# Patient Record
Sex: Female | Born: 1950 | Race: White | Hispanic: No | Marital: Married | Smoking: Never smoker
Health system: Southern US, Community
[De-identification: ages and names within clinical notes are randomized; demographics above are authoritative.]

## PROBLEM LIST (undated history)

## (undated) DIAGNOSIS — N179 Acute kidney failure, unspecified: Secondary | ICD-10-CM

## (undated) DIAGNOSIS — I509 Heart failure, unspecified: Secondary | ICD-10-CM

## (undated) DIAGNOSIS — I4891 Unspecified atrial fibrillation: Secondary | ICD-10-CM

## (undated) DIAGNOSIS — E119 Type 2 diabetes mellitus without complications: Secondary | ICD-10-CM

## (undated) DIAGNOSIS — N289 Disorder of kidney and ureter, unspecified: Secondary | ICD-10-CM

## (undated) DIAGNOSIS — N189 Chronic kidney disease, unspecified: Secondary | ICD-10-CM

## (undated) DIAGNOSIS — I10 Essential (primary) hypertension: Secondary | ICD-10-CM

## (undated) DIAGNOSIS — D649 Anemia, unspecified: Secondary | ICD-10-CM

## (undated) DIAGNOSIS — I5033 Acute on chronic diastolic (congestive) heart failure: Secondary | ICD-10-CM

## (undated) DIAGNOSIS — I272 Pulmonary hypertension, unspecified: Secondary | ICD-10-CM

---

## 2014-04-25 ENCOUNTER — Encounter (HOSPITAL_COMMUNITY): Payer: Self-pay | Admitting: *Deleted

## 2014-04-25 ENCOUNTER — Emergency Department (HOSPITAL_COMMUNITY): Payer: BC Managed Care – PPO

## 2014-04-25 ENCOUNTER — Emergency Department (HOSPITAL_COMMUNITY)
Admission: EM | Admit: 2014-04-25 | Discharge: 2014-04-25 | Disposition: A | Discharge status: Home / Self Care | Payer: BC Managed Care – PPO | Attending: Emergency Medicine | Admitting: Emergency Medicine

## 2014-04-25 DIAGNOSIS — H9209 Otalgia, unspecified ear: Secondary | ICD-10-CM | POA: Insufficient documentation

## 2014-04-25 DIAGNOSIS — J069 Acute upper respiratory infection, unspecified: Secondary | ICD-10-CM | POA: Diagnosis not present

## 2014-04-25 DIAGNOSIS — E119 Type 2 diabetes mellitus without complications: Secondary | ICD-10-CM | POA: Diagnosis not present

## 2014-04-25 DIAGNOSIS — R05 Cough: Secondary | ICD-10-CM

## 2014-04-25 DIAGNOSIS — I10 Essential (primary) hypertension: Secondary | ICD-10-CM | POA: Insufficient documentation

## 2014-04-25 DIAGNOSIS — R059 Cough, unspecified: Secondary | ICD-10-CM

## 2014-04-25 HISTORY — DX: Essential (primary) hypertension: I10

## 2014-04-25 HISTORY — DX: Type 2 diabetes mellitus without complications: E11.9

## 2014-04-25 LAB — CBG MONITORING, ED: Glucose-Capillary: 158 mg/dL — ABNORMAL HIGH (ref 70–99)

## 2014-04-25 MED ORDER — FLUTICASONE PROPIONATE 50 MCG/ACT NA SUSP
1.0000 | Freq: Every day | NASAL | Status: DC
  Administered 2014-04-25: 1 via NASAL
  Filled 2014-04-25: qty 16

## 2014-04-25 MED ORDER — FLUTICASONE PROPIONATE 50 MCG/ACT NA SUSP: 2.0000 | Freq: Every day | NASAL | Status: AC

## 2014-04-25 MED ORDER — SALINE SPRAY 0.65 % NA SOLN
1.0000 | NASAL | Status: DC | PRN
  Administered 2014-04-25: 1 via NASAL
  Filled 2014-04-25: qty 44

## 2014-04-25 MED ORDER — SALINE SPRAY 0.65 % NA SOLN: 1.0000 | NASAL | Status: AC | PRN

## 2014-04-25 MED ORDER — AZITHROMYCIN 250 MG PO TABS: 250.0000 mg | ORAL_TABLET | Freq: Every day | ORAL | Status: DC

## 2014-04-25 NOTE — ED Provider Notes (Signed)
CSN: [id number]     Arrival date & time 04/25/14  [id number] History   First MD Initiated Contact with Patient 04/25/14 [phone]     Chief Complaint  Patient presents with  . URI     (Consider location/radiation/quality/duration/timing/severity/associated sxs/prior Treatment) HPI  This is a 63 year old female with history of diabetes and hypertension who presents with congestion and cough. Patient reports onset of symptoms over the last 2-3 days. She states she is having difficulty breathing out of her nose. She reports a cough productive of "clear sputum." She denies any fevers. She has 3 grandchildren with similar symptoms. She reports this morning she had associated shortness of breath. Denies any chest pain. Also reports right otalgia.  Past Medical History  Diagnosis Date  . Diabetes mellitus without complication   . Hypertension    History reviewed. No pertinent past surgical history. No family history on file. History  Substance Use Topics  . Smoking status: Never Smoker   . Smokeless tobacco: Not on file  . Alcohol Use: No   OB History    No data available     Review of Systems  Constitutional: Negative for fever and chills.  HENT: Positive for congestion, ear pain and sinus pressure. Negative for facial swelling and rhinorrhea.   Respiratory: Positive for cough and shortness of breath. Negative for chest tightness.   Cardiovascular: Negative for chest pain and leg swelling.  Gastrointestinal: Negative for nausea, vomiting and abdominal pain.  Genitourinary: Negative for dysuria.  Musculoskeletal: Negative for back pain.  Neurological: Negative for headaches.  All other systems reviewed and are negative.     Allergies  Morphine and related  Home Medications   Prior to Admission medications   Medication Sig Start Date End Date Taking? Authorizing Provider  azithromycin (ZITHROMAX) 250 MG tablet Take 1 tablet (250 mg total) by mouth daily. Take first 2 tablets  together, then 1 every day until finished. 04/25/14   Merryl Hacker, MD  fluticasone (FLONASE) 50 MCG/ACT nasal spray Place 2 sprays into both nostrils daily. 04/25/14   Merryl Hacker, MD  sodium chloride (OCEAN) 0.65 % SOLN nasal spray Place 1 spray into both nostrils as needed for congestion. 04/25/14   Merryl Hacker, MD   BP 181/74 mmHg  Pulse 76  Temp(Src) 99.1 F (37.3 C) (Oral)  Resp 27  Ht 5\' 7"  (1.702 m)  Wt 186 lb (84.369 kg)  BMI 29.12 kg/m2  SpO2 92% Physical Exam  Constitutional: She is oriented to person, place, and time. No distress.  HENT:  Head: Normocephalic and atraumatic.  Right Ear: External ear normal.  Left Ear: External ear normal.  Mouth/Throat: Oropharynx is clear and moist. No oropharyngeal exudate.  No tenderness to palpation over the maxillary sinuses  Eyes: Pupils are equal, round, and reactive to light.  Neck: Neck supple.  Cardiovascular: Normal rate, regular rhythm and normal heart sounds.   Pulmonary/Chest: Effort normal and breath sounds normal. No respiratory distress. She has no wheezes.  Transmitted upper airway sounds  Abdominal: Soft. There is no tenderness.  Musculoskeletal: She exhibits no edema.  Lymphadenopathy:    She has no cervical adenopathy.  Neurological: She is alert and oriented to person, place, and time.  Skin: Skin is warm and dry.  Psychiatric: She has a normal mood and affect.  Nursing note and vitals reviewed.   ED Course  Procedures (including critical care time) Labs Review Labs Reviewed  CBG MONITORING, ED - Abnormal; Notable for the following:  Glucose-Capillary 158 (*)    All other components within normal limits    Imaging Review Dg Chest 2 View  04/25/2014   CLINICAL DATA:  Shortness of breath, cough and congestion with fever.  EXAM: CHEST  2 VIEW  COMPARISON:  None.  FINDINGS: Lungs are adequately inflated and demonstrate bilateral prominence of the perihilar markings right greater than  left. No evidence of effusion. Mild cardiomegaly is present. There is calcified plaque over the thoracic aorta. There are degenerative changes of the spine.  IMPRESSION: Mild prominence of the perihilar markings right greater than left which may be due to mild asymmetric facet congestion although cannot exclude interstitial pneumonia.   Electronically Signed   By: Marin Olp M.D.   On: 04/25/2014 08:15     EKG Interpretation None      MDM   Final diagnoses:  Cough  Upper respiratory infection    Patient presents with congestion and cough. Nontoxic on exam. Vital signs are reassuring. Satting 94-95% on my evaluation off oxygen. She does have transmitted upper airway sounds likely secondary to mouth breathing.  Patient given Flonase and nasal saline. Chest x-ray obtained and shows mild prominence of perihilar markings on the right which cannot exclude interstitial pneumonia. Patient has been afebrile.  However, due to symptoms, will place on Z-Pak. Difficulty picking up good pleth on the monitor. Pulse ox was switched and patient is satting 97% on room air. She was able to ambulate and maintain oxygen saturations.  Physical exam continues to be benign. Discuss with patient supportive care including Flonase and nasal saline as well as a Z-Pak for possible early pneumonia.  She was given strict return precautions.  After history, exam, and medical workup I feel the patient has been appropriately medically screened and is safe for discharge home. Pertinent diagnoses were discussed with the patient. Patient was given return precautions.     Merryl Hacker, MD 04/25/14 [phone]

## 2014-04-25 NOTE — ED Notes (Signed)
Pt oxygen saturation reading 97% on room air. Pt states she feels ok with no difficulty breathing.

## 2014-04-25 NOTE — Discharge Instructions (Signed)
You were seen today for congestion and cough.  Your chest x-ray may show an early pneumonia. He has not had any fevers. However, we will place you on a Z-Pak to cover for pneumonia. You can take Flonase and nasal saline for symptomatic congestion. If he had increasing shortness of breath, developed fevers, or any new or worsening symptoms, you should be reevaluated.  Upper Respiratory Infection, Adult An upper respiratory infection (URI) is also sometimes known as the common cold. The upper respiratory tract includes the nose, sinuses, throat, trachea, and bronchi. Bronchi are the airways leading to the lungs. Most people improve within 1 week, but symptoms can last up to 2 weeks. A residual cough may last even longer.  CAUSES Many different viruses can infect the tissues lining the upper respiratory tract. The tissues become irritated and inflamed and often become very moist. Mucus production is also common. A cold is contagious. You can easily spread the virus to others by oral contact. This includes kissing, sharing a glass, coughing, or sneezing. Touching your mouth or nose and then touching a surface, which is then touched by another person, can also spread the virus. SYMPTOMS  Symptoms typically develop 1 to 3 days after you come in contact with a cold virus. Symptoms vary from person to person. They may include:  Runny nose.  Sneezing.  Nasal congestion.  Sinus irritation.  Sore throat.  Loss of voice (laryngitis).  Cough.  Fatigue.  Muscle aches.  Loss of appetite.  Headache.  Low-grade fever. DIAGNOSIS  You might diagnose your own cold based on familiar symptoms, since most people get a cold 2 to 3 times a year. Your caregiver can confirm this based on your exam. Most importantly, your caregiver can check that your symptoms are not due to another disease such as strep throat, sinusitis, pneumonia, asthma, or epiglottitis. Blood tests, throat tests, and X-rays are not  necessary to diagnose a common cold, but they may sometimes be helpful in excluding other more serious diseases. Your caregiver will decide if any further tests are required. RISKS AND COMPLICATIONS  You may be at risk for a more severe case of the common cold if you smoke cigarettes, have chronic heart disease (such as heart failure) or lung disease (such as asthma), or if you have a weakened immune system. The very young and very old are also at risk for more serious infections. Bacterial sinusitis, middle ear infections, and bacterial pneumonia can complicate the common cold. The common cold can worsen asthma and chronic obstructive pulmonary disease (COPD). Sometimes, these complications can require emergency medical care and may be life-threatening. PREVENTION  The best way to protect against getting a cold is to practice good hygiene. Avoid oral or hand contact with people with cold symptoms. Wash your hands often if contact occurs. There is no clear evidence that vitamin C, vitamin E, echinacea, or exercise reduces the chance of developing a cold. However, it is always recommended to get plenty of rest and practice good nutrition. TREATMENT  Treatment is directed at relieving symptoms. There is no cure. Antibiotics are not effective, because the infection is caused by a virus, not by bacteria. Treatment may include:  Increased fluid intake. Sports drinks offer valuable electrolytes, sugars, and fluids.  Breathing heated mist or steam (vaporizer or shower).  Eating chicken soup or other clear broths, and maintaining good nutrition.  Getting plenty of rest.  Using gargles or lozenges for comfort.  Controlling fevers with ibuprofen or acetaminophen as  directed by your caregiver.  Increasing usage of your inhaler if you have asthma. Zinc gel and zinc lozenges, taken in the first 24 hours of the common cold, can shorten the duration and lessen the severity of symptoms. Pain medicines may help  with fever, muscle aches, and throat pain. A variety of non-prescription medicines are available to treat congestion and runny nose. Your caregiver can make recommendations and may suggest nasal or lung inhalers for other symptoms.  HOME CARE INSTRUCTIONS   Only take over-the-counter or prescription medicines for pain, discomfort, or fever as directed by your caregiver.  Use a warm mist humidifier or inhale steam from a shower to increase air moisture. This may keep secretions moist and make it easier to breathe.  Drink enough water and fluids to keep your urine clear or pale yellow.  Rest as needed.  Return to work when your temperature has returned to normal or as your caregiver advises. You may need to stay home longer to avoid infecting others. You can also use a face mask and careful hand washing to prevent spread of the virus. SEEK MEDICAL CARE IF:   After the first few days, you feel you are getting worse rather than better.  You need your caregiver's advice about medicines to control symptoms.  You develop chills, worsening shortness of breath, or brown or red sputum. These may be signs of pneumonia.  You develop yellow or brown nasal discharge or pain in the face, especially when you bend forward. These may be signs of sinusitis.  You develop a fever, swollen neck glands, pain with swallowing, or white areas in the back of your throat. These may be signs of strep throat. SEEK IMMEDIATE MEDICAL CARE IF:   You have a fever.  You develop severe or persistent headache, ear pain, sinus pain, or chest pain.  You develop wheezing, a prolonged cough, cough up blood, or have a change in your usual mucus (if you have chronic lung disease).  You develop sore muscles or a stiff neck. Document Released: 11/15/2000 Document Revised: 08/14/2011 Document Reviewed: 08/27/2013 [facility] Patient Information 2015 [address], [place]. This information is not intended to replace advice given to you by  your health care provider. Make sure you discuss any questions you have with your health care provider.

## 2014-04-25 NOTE — ED Notes (Signed)
Pulse ox did not pick up a good pleth in room stat read 88% room air changed to sticker pulse ox with good pleth patient stat is at 96% room air. When ambulating patients O2 stat 97% room air. Patient states she feels fine and breathing at her normal. RN and MD notified and aware.

## 2014-04-25 NOTE — ED Notes (Signed)
MD at bedside. 

## 2014-04-25 NOTE — ED Notes (Addendum)
Pt states congestion and cough, productive and clear in color x 2 days. States 3 of her grandchildren have URI's. Pt states nasal congestion and "head feels like it is stopped up real bad" 89-90% on RA. Pt placed on O2 at 2L via [place] and O2 went up to 99%

## 2014-04-25 NOTE — ED Notes (Signed)
Request for meds sent to the pharmacy.

## 2014-06-07 ENCOUNTER — Inpatient Hospital Stay (HOSPITAL_COMMUNITY)
Admission: EM | Admit: 2014-06-07 | Discharge: 2014-06-14 | DRG: 682 | Disposition: A | Discharge status: Home Health | Payer: BLUE CROSS/BLUE SHIELD | Attending: Internal Medicine | Admitting: Internal Medicine

## 2014-06-07 ENCOUNTER — Emergency Department (HOSPITAL_COMMUNITY): Payer: BLUE CROSS/BLUE SHIELD

## 2014-06-07 ENCOUNTER — Encounter (HOSPITAL_COMMUNITY): Payer: Self-pay | Admitting: Emergency Medicine

## 2014-06-07 DIAGNOSIS — E785 Hyperlipidemia, unspecified: Secondary | ICD-10-CM | POA: Diagnosis present

## 2014-06-07 DIAGNOSIS — Z6829 Body mass index (BMI) 29.0-29.9, adult: Secondary | ICD-10-CM

## 2014-06-07 DIAGNOSIS — E669 Obesity, unspecified: Secondary | ICD-10-CM | POA: Diagnosis present

## 2014-06-07 DIAGNOSIS — S0083XA Contusion of other part of head, initial encounter: Secondary | ICD-10-CM

## 2014-06-07 DIAGNOSIS — E876 Hypokalemia: Secondary | ICD-10-CM | POA: Diagnosis present

## 2014-06-07 DIAGNOSIS — R55 Syncope and collapse: Secondary | ICD-10-CM

## 2014-06-07 DIAGNOSIS — E860 Dehydration: Secondary | ICD-10-CM | POA: Diagnosis present

## 2014-06-07 DIAGNOSIS — N179 Acute kidney failure, unspecified: Secondary | ICD-10-CM

## 2014-06-07 DIAGNOSIS — J9600 Acute respiratory failure, unspecified whether with hypoxia or hypercapnia: Secondary | ICD-10-CM | POA: Diagnosis not present

## 2014-06-07 DIAGNOSIS — N390 Urinary tract infection, site not specified: Secondary | ICD-10-CM | POA: Diagnosis present

## 2014-06-07 DIAGNOSIS — E872 Acidosis, unspecified: Secondary | ICD-10-CM | POA: Diagnosis present

## 2014-06-07 DIAGNOSIS — Y92010 Kitchen of single-family (private) house as the place of occurrence of the external cause: Secondary | ICD-10-CM | POA: Diagnosis not present

## 2014-06-07 DIAGNOSIS — R600 Localized edema: Secondary | ICD-10-CM | POA: Diagnosis present

## 2014-06-07 DIAGNOSIS — I129 Hypertensive chronic kidney disease with stage 1 through stage 4 chronic kidney disease, or unspecified chronic kidney disease: Secondary | ICD-10-CM | POA: Diagnosis present

## 2014-06-07 DIAGNOSIS — R0600 Dyspnea, unspecified: Secondary | ICD-10-CM | POA: Diagnosis present

## 2014-06-07 DIAGNOSIS — E119 Type 2 diabetes mellitus without complications: Secondary | ICD-10-CM | POA: Diagnosis present

## 2014-06-07 DIAGNOSIS — E1122 Type 2 diabetes mellitus with diabetic chronic kidney disease: Secondary | ICD-10-CM | POA: Diagnosis present

## 2014-06-07 DIAGNOSIS — I5033 Acute on chronic diastolic (congestive) heart failure: Secondary | ICD-10-CM | POA: Diagnosis present

## 2014-06-07 DIAGNOSIS — I272 Other secondary pulmonary hypertension: Secondary | ICD-10-CM | POA: Diagnosis present

## 2014-06-07 DIAGNOSIS — T502X5A Adverse effect of carbonic-anhydrase inhibitors, benzothiadiazides and other diuretics, initial encounter: Secondary | ICD-10-CM | POA: Diagnosis present

## 2014-06-07 DIAGNOSIS — E559 Vitamin D deficiency, unspecified: Secondary | ICD-10-CM | POA: Diagnosis present

## 2014-06-07 DIAGNOSIS — R0602 Shortness of breath: Secondary | ICD-10-CM

## 2014-06-07 DIAGNOSIS — T501X5A Adverse effect of loop [high-ceiling] diuretics, initial encounter: Secondary | ICD-10-CM | POA: Diagnosis present

## 2014-06-07 DIAGNOSIS — B961 Klebsiella pneumoniae [K. pneumoniae] as the cause of diseases classified elsewhere: Secondary | ICD-10-CM | POA: Diagnosis present

## 2014-06-07 DIAGNOSIS — T447X5A Adverse effect of beta-adrenoreceptor antagonists, initial encounter: Secondary | ICD-10-CM | POA: Diagnosis present

## 2014-06-07 DIAGNOSIS — S40812A Abrasion of left upper arm, initial encounter: Secondary | ICD-10-CM | POA: Diagnosis present

## 2014-06-07 DIAGNOSIS — N170 Acute kidney failure with tubular necrosis: Secondary | ICD-10-CM | POA: Diagnosis present

## 2014-06-07 DIAGNOSIS — D649 Anemia, unspecified: Secondary | ICD-10-CM | POA: Diagnosis present

## 2014-06-07 DIAGNOSIS — N183 Chronic kidney disease, stage 3 (moderate): Secondary | ICD-10-CM | POA: Diagnosis present

## 2014-06-07 DIAGNOSIS — W19XXXA Unspecified fall, initial encounter: Secondary | ICD-10-CM | POA: Diagnosis present

## 2014-06-07 DIAGNOSIS — I5032 Chronic diastolic (congestive) heart failure: Secondary | ICD-10-CM | POA: Diagnosis present

## 2014-06-07 DIAGNOSIS — I2699 Other pulmonary embolism without acute cor pulmonale: Secondary | ICD-10-CM

## 2014-06-07 DIAGNOSIS — I951 Orthostatic hypotension: Secondary | ICD-10-CM | POA: Diagnosis present

## 2014-06-07 DIAGNOSIS — N189 Chronic kidney disease, unspecified: Secondary | ICD-10-CM

## 2014-06-07 DIAGNOSIS — I480 Paroxysmal atrial fibrillation: Secondary | ICD-10-CM | POA: Diagnosis present

## 2014-06-07 DIAGNOSIS — D509 Iron deficiency anemia, unspecified: Secondary | ICD-10-CM | POA: Diagnosis present

## 2014-06-07 DIAGNOSIS — J9601 Acute respiratory failure with hypoxia: Secondary | ICD-10-CM | POA: Diagnosis present

## 2014-06-07 DIAGNOSIS — I10 Essential (primary) hypertension: Secondary | ICD-10-CM | POA: Diagnosis present

## 2014-06-07 DIAGNOSIS — I4891 Unspecified atrial fibrillation: Secondary | ICD-10-CM | POA: Diagnosis not present

## 2014-06-07 DIAGNOSIS — I5031 Acute diastolic (congestive) heart failure: Secondary | ICD-10-CM | POA: Diagnosis present

## 2014-06-07 HISTORY — DX: Acute kidney failure, unspecified: N17.9

## 2014-06-07 HISTORY — DX: Anemia, unspecified: D64.9

## 2014-06-07 HISTORY — DX: Chronic kidney disease, unspecified: N18.9

## 2014-06-07 HISTORY — DX: Unspecified atrial fibrillation: I48.91

## 2014-06-07 HISTORY — DX: Disorder of kidney and ureter, unspecified: N28.9

## 2014-06-07 HISTORY — DX: Pulmonary hypertension, unspecified: I27.20

## 2014-06-07 HISTORY — DX: Heart failure, unspecified: I50.9

## 2014-06-07 HISTORY — DX: Acute on chronic diastolic (congestive) heart failure: I50.33

## 2014-06-07 LAB — BASIC METABOLIC PANEL
ANION GAP: 9 (ref 5–15)
BUN: 64 mg/dL — AB (ref 6–23)
CALCIUM: 7.9 mg/dL — AB (ref 8.4–10.5)
CHLORIDE: 110 meq/L (ref 96–112)
CO2: 18 mmol/L — ABNORMAL LOW (ref 19–32)
Creatinine, Ser: 6.55 mg/dL — ABNORMAL HIGH (ref 0.50–1.10)
GFR calc Af Amer: 7 mL/min — ABNORMAL LOW (ref >90)
GFR calc non Af Amer: 6 mL/min — ABNORMAL LOW (ref >90)
GLUCOSE: 91 mg/dL (ref 70–99)
Potassium: 4.1 mmol/L (ref 3.5–5.1)
Sodium: 137 mmol/L (ref 135–145)

## 2014-06-07 LAB — CBC WITH DIFFERENTIAL/PLATELET
Basophils Absolute: 0.1 10*3/uL (ref 0.0–0.1)
Basophils Relative: 1 % (ref 0–1)
Eosinophils Absolute: 0.3 10*3/uL (ref 0.0–0.7)
Eosinophils Relative: 6 % — ABNORMAL HIGH (ref 0–5)
HEMATOCRIT: 23.6 % — AB (ref 36.0–46.0)
Hemoglobin: 7.5 g/dL — ABNORMAL LOW (ref 12.0–15.0)
LYMPHS ABS: 1 10*3/uL (ref 0.7–4.0)
LYMPHS PCT: 19 % (ref 12–46)
MCH: 27.8 pg (ref 26.0–34.0)
MCHC: 31.8 g/dL (ref 30.0–36.0)
MCV: 87.4 fL (ref 78.0–100.0)
MONO ABS: 0.5 10*3/uL (ref 0.1–1.0)
MONOS PCT: 8 % (ref 3–12)
Neutro Abs: 3.7 10*3/uL (ref 1.7–7.7)
Neutrophils Relative %: 66 % (ref 43–77)
PLATELETS: 292 10*3/uL (ref 150–400)
RBC: 2.7 MIL/uL — ABNORMAL LOW (ref 3.87–5.11)
RDW: 14.4 % (ref 11.5–15.5)
WBC: 5.6 10*3/uL (ref 4.0–10.5)

## 2014-06-07 LAB — URINALYSIS, ROUTINE W REFLEX MICROSCOPIC
BILIRUBIN URINE: NEGATIVE
GLUCOSE, UA: NEGATIVE mg/dL
KETONES UR: NEGATIVE mg/dL
Leukocytes, UA: NEGATIVE
NITRITE: NEGATIVE
Protein, ur: 100 mg/dL — AB
Specific Gravity, Urine: 1.025 (ref 1.005–1.030)
Urobilinogen, UA: 0.2 mg/dL (ref 0.0–1.0)
pH: 6 (ref 5.0–8.0)

## 2014-06-07 LAB — URINE MICROSCOPIC-ADD ON

## 2014-06-07 LAB — TROPONIN I

## 2014-06-07 NOTE — ED Provider Notes (Signed)
CSN: [id number]     Arrival date & time 06/07/14  2113 History   This chart was scribed for Brenda Blade, MD by Brenda Lee, ED Scribe. This patient was seen in room APA04/APA04 and the patient's care was started at 9:17 PM.      Chief Complaint  Patient presents with  . Near Syncope  . Cough   Patient is a 64 y.o. female presenting with near-syncope and cough. The history is provided by the patient. No language interpreter was used.  Near Syncope Pertinent negatives include no shortness of breath.  Cough Associated symptoms: no fever and no shortness of breath    HPI Comments: Brenda Lee is a 64 y.o. female who presents to the Emergency Department complaining of near syncope onset tonight. Pt states her symptoms are exasperated when bending over forwards. Pt states that today she fell after bending over to pick up a glass of soda. Pt states that a few days prior she experienced similar symptoms. Pt states that a few days ago she fell but doesn't remember. Pt presents with some facial bruising and an abrasion to her left arm. Pt states that she has had a productive cough of clear sputum for the past 3 weeks as well. Pt denies fever, sob, nausea, vomiting, trouble urinating or gait problem.     Past Medical History  Diagnosis Date  . Diabetes mellitus without complication   . Hypertension    History reviewed. No pertinent past surgical history. History reviewed. No pertinent family history. History  Substance Use Topics  . Smoking status: Never Smoker   . Smokeless tobacco: Not on file  . Alcohol Use: No   OB History    No data available      Review of Systems  Constitutional: Negative for fever.  Respiratory: Positive for cough. Negative for shortness of breath.   Cardiovascular: Positive for near-syncope.  Gastrointestinal: Negative for nausea and vomiting.  Genitourinary: Negative for difficulty urinating.  Musculoskeletal: Negative for gait problem.  All  other systems reviewed and are negative.    Allergies  Morphine and related  Home Medications   Prior to Admission medications   Medication Sig Start Date End Date Taking? Authorizing Provider  amLODipine-valsartan (EXFORGE) 10-320 MG per tablet Take 1 tablet by mouth daily. 04/21/14   Historical Provider, MD  atenolol (TENORMIN) 50 MG tablet Take 75 mg by mouth 2 (two) times daily. 04/21/14   Historical Provider, MD  atorvastatin (LIPITOR) 10 MG tablet Take 10 mg by mouth at bedtime. 04/21/14   Historical Provider, MD  azithromycin (ZITHROMAX) 250 MG tablet Take 1 tablet (250 mg total) by mouth daily. Take first 2 tablets together, then 1 every day until finished. Patient not taking: Reported on 06/07/2014 04/25/14   Merryl Hacker, MD  clonazePAM (KLONOPIN) 0.5 MG tablet Take 0.5 mg by mouth 2 (two) times daily as needed. 04/21/14   Historical Provider, MD  fexofenadine (ALLEGRA) 180 MG tablet Take 180 mg by mouth daily. 05/01/14   Historical Provider, MD  fluticasone (FLONASE) 50 MCG/ACT nasal spray Place 2 sprays into both nostrils daily. 04/25/14   Merryl Hacker, MD  glimepiride (AMARYL) 2 MG tablet Take 2 mg by mouth daily. 04/21/14   Historical Provider, MD  lisinopril (PRINIVIL,ZESTRIL) 10 MG tablet Take 10 mg by mouth daily. 04/21/14   Historical Provider, MD  metFORMIN (GLUCOPHAGE) 500 MG tablet Take 500 mg by mouth daily. 04/21/14   Historical Provider, MD  sodium chloride (OCEAN)  0.65 % SOLN nasal spray Place 1 spray into both nostrils as needed for congestion. 04/25/14   Merryl Hacker, MD  triamterene-hydrochlorothiazide (MAXZIDE-25) 37.5-25 MG per tablet Take 1 tablet by mouth daily. 04/21/14   Historical Provider, MD   Triage Vitals: BP 148/59 mmHg  Pulse 74  Temp(Src) 97.7 F (36.5 C)  Resp 22  Ht 5\' 7"  (1.702 m)  Wt 186 lb (84.369 kg)  BMI 29.12 kg/m2  SpO2 96%    Physical Exam  Constitutional: She is oriented to person, place, and time. She appears  well-developed and well-nourished.  HENT:  Head: Normocephalic and atraumatic.  Bruising to left upper cheek and left lateral periorbital, no crepitation  Eyes: Conjunctivae and EOM are normal. Pupils are equal, round, and reactive to light.  Neck: Normal range of motion and phonation normal. Neck supple.  Cardiovascular: Normal rate and regular rhythm.   Pulmonary/Chest: Effort normal and breath sounds normal. She exhibits no tenderness.  Abdominal: Soft. She exhibits no distension and no mass. There is no tenderness. There is no guarding.  No palpable urinary bladder enlargement  Musculoskeletal: Normal range of motion. She exhibits edema.  Left elbow superficial abrasion laterally appear to healing with out bleeding. normal ROM elbow, wrist and hand. 2+ edema of bilateral legs   Neurological: She is alert and oriented to person, place, and time. She exhibits normal muscle tone.  Skin: Skin is warm and dry.  Psychiatric: She has a normal mood and affect. Her behavior is normal. Judgment and thought content normal.  Nursing note and vitals reviewed.   ED Course  Procedures (including critical care time) DIAGNOSTIC STUDIES: Oxygen Saturation is 96% on RA, adequate by my interpretation.    COORDINATION OF CARE: 9:39 PM-Discussed treatment plan with pt at bedside and pt agreed to plan.   Medications - No data to display  Patient Vitals for the past 24 hrs:  BP Temp Pulse Resp SpO2 Height Weight  06/07/14 2130 138/63 mmHg - 72 25 94 % - -  06/07/14 2119 148/59 mmHg 97.7 F (36.5 C) 74 22 96 % 5\' 7"  (1.702 m) 186 lb (84.369 kg)    11:14 PM Reevaluation with update and discussion. After initial assessment and treatment, an updated evaluation reveals no further complaints.  She cannot recall ever having problems with her kidney.  Family members are here and are updated on the findings. Brenda Lee   11:30 p.m.- bladder scan done by nursing shows greater than 150 cc of urine in the  urinary bladder.  Foley catheterization order to improve urinary output.   11:41 PM-Consult complete with Dr. Arnoldo Morale. Patient case explained and discussed. She agrees to admit patient for further evaluation and treatment. Call ended at 23:47  CRITICAL CARE Performed by: Brenda Lee Total critical care time: 35 minutes Critical care time was exclusive of separately billable procedures and treating other patients. Critical care was necessary to treat or prevent imminent or life-threatening deterioration. Critical care was time spent personally by me on the following activities: development of treatment plan with patient and/or surrogate as well as nursing, discussions with consultants, evaluation of patient's response to treatment, examination of patient, obtaining history from patient or surrogate, ordering and performing treatments and interventions, ordering and review of laboratory studies, ordering and review of radiographic studies, pulse oximetry and re-evaluation of patient's condition.  Labs Review Labs Reviewed  CBC WITH DIFFERENTIAL - Abnormal; Notable for the following:    RBC 2.70 (*)    Hemoglobin 7.5 (*)  HCT 23.6 (*)    Eosinophils Relative 6 (*)    All other components within normal limits  BASIC METABOLIC PANEL - Abnormal; Notable for the following:    CO2 18 (*)    BUN 64 (*)    Creatinine, Ser 6.55 (*)    Calcium 7.9 (*)    GFR calc non Af Amer 6 (*)    GFR calc Af Amer 7 (*)    All other components within normal limits  URINALYSIS, ROUTINE W REFLEX MICROSCOPIC - Abnormal; Notable for the following:    APPearance HAZY (*)    Hgb urine dipstick TRACE (*)    Protein, ur 100 (*)    All other components within normal limits  URINE MICROSCOPIC-ADD ON - Abnormal; Notable for the following:    Squamous Epithelial / LPF MANY (*)    Bacteria, UA MANY (*)    All other components within normal limits  URINE CULTURE  URINE CULTURE  TROPONIN I    Imaging  Review Dg Chest 2 View  06/07/2014   CLINICAL DATA:  Productive cough for 3 weeks. Vomiting. Near syncope tonight. Multiple falls over the last few days.  EXAM: CHEST  2 VIEW  COMPARISON:  04/25/2014  FINDINGS: Shallow inspiration with elevation of the right hemidiaphragm. Normal heart size and pulmonary vascularity. No focal airspace disease or consolidation in the lungs. No blunting of costophrenic angles. No pneumothorax. Mediastinal contours appear intact. Degenerative changes in the spine. Calcified and tortuous aorta.  IMPRESSION: No active cardiopulmonary disease.   Electronically Signed   By: Lucienne Capers M.D.   On: 06/07/2014 22:11   Ct Head Wo Contrast  06/07/2014   CLINICAL DATA:  Near syncope tonight. Symptoms are exacerbated when bending forward to. Multiple falls over the past few days. Facial bruising.  EXAM: CT HEAD WITHOUT CONTRAST  TECHNIQUE: Contiguous axial images were obtained from the base of the skull through the vertex without intravenous contrast.  COMPARISON:  None.  FINDINGS: Diffuse cerebral atrophy. Mild ventricular dilatation consistent with central atrophy. Patchy periventricular changes in the deep white matter consistent with small vessel ischemia. No mass effect or midline shift. No abnormal extra-axial fluid collections. Gray-white matter junctions are distinct. Basal cisterns are not effaced. No evidence of acute intracranial hemorrhage. No depressed skull fractures. Visualized paranasal sinuses and mastoid air cells are not opacified. Vascular calcifications.  IMPRESSION: No acute intracranial abnormalities. Chronic atrophy and small vessel ischemic changes.   Electronically Signed   By: Lucienne Capers M.D.   On: 06/07/2014 22:13     EKG Interpretation   Date/Time:  Sunday June 07 2014 21:17:52 EST Ventricular Rate:  74 PR Interval:  150 QRS Duration: 82 QT Interval:  439 QTC Calculation: 487 R Axis:   27 Text Interpretation:  Sinus rhythm Low voltage,  precordial leads  Borderline prolonged QT interval No previous ECGs available Confirmed by  [facility]  MD, Vira Agar [id number]) on 06/07/2014 9:51:44 PM      MDM   Final diagnoses:  Acute renal failure, unspecified acute renal failure type  Syncope, unspecified syncope type  Contusion of face, initial encounter    Acute renal failure.  Multifactorial source is suspected.  She is on ACE and ARB and has urinary retention.  She will need to be admitted for observation, treatment, and renal consultation.  Patient is essentially nontoxic, but has had at least one episode of syncope and another episode of fall related to near syncope.  EKG does not show conduction  abnormalities.  Potassium is normal.  No traumatic injuries.  Doubt serious bacterial infection.   Nursing Notes Reviewed/ Care Coordinated Applicable Imaging Reviewed Interpretation of Laboratory Data incorporated into ED treatment  I personally performed the services described in this documentation, which was scribed in my presence. The recorded information has been reviewed and is accurate.       Brenda Blade, MD 06/07/14 [phone]

## 2014-06-07 NOTE — ED Notes (Signed)
States she has had a cold since Thanksgiving with URI, has been treated. BP elevated and patient has syncopal episodes when she bends over.

## 2014-06-08 ENCOUNTER — Inpatient Hospital Stay (HOSPITAL_COMMUNITY): Payer: BLUE CROSS/BLUE SHIELD

## 2014-06-08 ENCOUNTER — Encounter (HOSPITAL_COMMUNITY): Payer: Self-pay | Admitting: *Deleted

## 2014-06-08 DIAGNOSIS — R0600 Dyspnea, unspecified: Secondary | ICD-10-CM | POA: Diagnosis present

## 2014-06-08 DIAGNOSIS — R600 Localized edema: Secondary | ICD-10-CM | POA: Diagnosis present

## 2014-06-08 DIAGNOSIS — D649 Anemia, unspecified: Secondary | ICD-10-CM

## 2014-06-08 DIAGNOSIS — R55 Syncope and collapse: Secondary | ICD-10-CM

## 2014-06-08 DIAGNOSIS — E119 Type 2 diabetes mellitus without complications: Secondary | ICD-10-CM

## 2014-06-08 DIAGNOSIS — I059 Rheumatic mitral valve disease, unspecified: Secondary | ICD-10-CM

## 2014-06-08 DIAGNOSIS — N179 Acute kidney failure, unspecified: Secondary | ICD-10-CM

## 2014-06-08 DIAGNOSIS — E785 Hyperlipidemia, unspecified: Secondary | ICD-10-CM

## 2014-06-08 DIAGNOSIS — R609 Edema, unspecified: Secondary | ICD-10-CM

## 2014-06-08 DIAGNOSIS — I5033 Acute on chronic diastolic (congestive) heart failure: Secondary | ICD-10-CM

## 2014-06-08 DIAGNOSIS — I10 Essential (primary) hypertension: Secondary | ICD-10-CM

## 2014-06-08 DIAGNOSIS — S0083XA Contusion of other part of head, initial encounter: Secondary | ICD-10-CM

## 2014-06-08 DIAGNOSIS — I5032 Chronic diastolic (congestive) heart failure: Secondary | ICD-10-CM

## 2014-06-08 HISTORY — DX: Acute on chronic diastolic (congestive) heart failure: I50.33

## 2014-06-08 HISTORY — DX: Acute kidney failure, unspecified: N17.9

## 2014-06-08 LAB — CBC
HCT: 21.8 % — ABNORMAL LOW (ref 36.0–46.0)
HEMOGLOBIN: 7 g/dL — AB (ref 12.0–15.0)
MCH: 28.1 pg (ref 26.0–34.0)
MCHC: 32.1 g/dL (ref 30.0–36.0)
MCV: 87.6 fL (ref 78.0–100.0)
PLATELETS: 255 10*3/uL (ref 150–400)
RBC: 2.49 MIL/uL — ABNORMAL LOW (ref 3.87–5.11)
RDW: 14.3 % (ref 11.5–15.5)
WBC: 4.9 10*3/uL (ref 4.0–10.5)

## 2014-06-08 LAB — LIPID PANEL
Cholesterol: 169 mg/dL (ref 0–200)
HDL: 29 mg/dL — ABNORMAL LOW (ref >39)
LDL Cholesterol: 118 mg/dL — ABNORMAL HIGH (ref 0–99)
Total CHOL/HDL Ratio: 5.8 RATIO
Triglycerides: 112 mg/dL (ref <150)
VLDL: 22 mg/dL (ref 0–40)

## 2014-06-08 LAB — BASIC METABOLIC PANEL
Anion gap: 8 (ref 5–15)
BUN: 66 mg/dL — ABNORMAL HIGH (ref 6–23)
CO2: 18 mmol/L — ABNORMAL LOW (ref 19–32)
Calcium: 7.8 mg/dL — ABNORMAL LOW (ref 8.4–10.5)
Chloride: 110 mEq/L (ref 96–112)
Creatinine, Ser: 6.55 mg/dL — ABNORMAL HIGH (ref 0.50–1.10)
GFR, EST AFRICAN AMERICAN: 7 mL/min — AB (ref >90)
GFR, EST NON AFRICAN AMERICAN: 6 mL/min — AB (ref >90)
Glucose, Bld: 197 mg/dL — ABNORMAL HIGH (ref 70–99)
POTASSIUM: 4.7 mmol/L (ref 3.5–5.1)
SODIUM: 136 mmol/L (ref 135–145)

## 2014-06-08 LAB — GLUCOSE, CAPILLARY
GLUCOSE-CAPILLARY: 191 mg/dL — AB (ref 70–99)
GLUCOSE-CAPILLARY: 164 mg/dL — AB (ref 70–99)
GLUCOSE-CAPILLARY: 209 mg/dL — AB (ref 70–99)
GLUCOSE-CAPILLARY: 307 mg/dL — AB (ref 70–99)
Glucose-Capillary: 197 mg/dL — ABNORMAL HIGH (ref 70–99)
Glucose-Capillary: 305 mg/dL — ABNORMAL HIGH (ref 70–99)

## 2014-06-08 LAB — IRON AND TIBC
Iron: 24 ug/dL — ABNORMAL LOW (ref 42–145)
Saturation Ratios: 11 % — ABNORMAL LOW (ref 20–55)
TIBC: 221 ug/dL — ABNORMAL LOW (ref 250–470)
UIBC: 197 ug/dL (ref 125–400)

## 2014-06-08 LAB — FERRITIN: FERRITIN: 121 ng/mL (ref 10–291)

## 2014-06-08 LAB — RETICULOCYTES
RBC.: 2.49 MIL/uL — ABNORMAL LOW (ref 3.87–5.11)
RETIC CT PCT: 2.1 % (ref 0.4–3.1)
Retic Count, Absolute: 52.3 10*3/uL (ref 19.0–186.0)

## 2014-06-08 LAB — TSH: TSH: 1.646 u[IU]/mL (ref 0.350–4.500)

## 2014-06-08 LAB — VITAMIN B12: Vitamin B-12: 433 pg/mL (ref 211–911)

## 2014-06-08 LAB — FOLATE: FOLATE: 5.5 ng/mL

## 2014-06-08 MED ORDER — HEPARIN SODIUM (PORCINE) 5000 UNIT/ML IJ SOLN
5000.0000 [IU] | Freq: Three times a day (TID) | INTRAMUSCULAR | Status: DC
  Administered 2014-06-08 – 2014-06-14 (×18): 5000 [IU] via SUBCUTANEOUS
  Filled 2014-06-08 (×18): qty 1

## 2014-06-08 MED ORDER — OXYCODONE HCL 5 MG PO TABS: 5.0000 mg | ORAL_TABLET | ORAL | Status: DC | PRN

## 2014-06-08 MED ORDER — PANTOPRAZOLE SODIUM 40 MG PO TBEC
40.0000 mg | DELAYED_RELEASE_TABLET | Freq: Every day | ORAL | Status: DC
  Administered 2014-06-08 – 2014-06-14 (×7): 40 mg via ORAL
  Filled 2014-06-08 (×7): qty 1

## 2014-06-08 MED ORDER — CLONAZEPAM 0.5 MG PO TABS
0.5000 mg | ORAL_TABLET | Freq: Two times a day (BID) | ORAL | Status: DC | PRN
Start: 2014-06-08 — End: 2014-06-14
  Administered 2014-06-09 – 2014-06-11 (×2): 0.5 mg via ORAL
  Filled 2014-06-08 (×3): qty 1

## 2014-06-08 MED ORDER — SODIUM CHLORIDE 0.9 % IJ SOLN
3.0000 mL | Freq: Two times a day (BID) | INTRAMUSCULAR | Status: DC
  Administered 2014-06-08 – 2014-06-11 (×6): 3 mL via INTRAVENOUS

## 2014-06-08 MED ORDER — INSULIN ASPART 100 UNIT/ML ~~LOC~~ SOLN
0.0000 [IU] | SUBCUTANEOUS | Status: DC
  Administered 2014-06-08 (×3): 2 [IU] via SUBCUTANEOUS
  Administered 2014-06-08 (×2): 7 [IU] via SUBCUTANEOUS
  Administered 2014-06-08: 3 [IU] via SUBCUTANEOUS
  Administered 2014-06-09: 5 [IU] via SUBCUTANEOUS
  Administered 2014-06-09: 2 [IU] via SUBCUTANEOUS

## 2014-06-08 MED ORDER — ACETAMINOPHEN 650 MG RE SUPP: 650.0000 mg | Freq: Four times a day (QID) | RECTAL | Status: DC | PRN

## 2014-06-08 MED ORDER — ATORVASTATIN CALCIUM 10 MG PO TABS
10.0000 mg | ORAL_TABLET | Freq: Every day | ORAL | Status: DC
  Administered 2014-06-08 – 2014-06-13 (×6): 10 mg via ORAL
  Filled 2014-06-08 (×6): qty 1

## 2014-06-08 MED ORDER — SODIUM CHLORIDE 0.9 % IV SOLN
INTRAVENOUS | Status: DC
  Administered 2014-06-08 (×3): via INTRAVENOUS

## 2014-06-08 MED ORDER — ATENOLOL 25 MG PO TABS
75.0000 mg | ORAL_TABLET | Freq: Two times a day (BID) | ORAL | Status: DC
  Administered 2014-06-08 – 2014-06-11 (×7): 75 mg via ORAL
  Filled 2014-06-08 (×7): qty 3

## 2014-06-08 MED ORDER — HYDRALAZINE HCL 20 MG/ML IJ SOLN: 5.0000 mg | Freq: Four times a day (QID) | INTRAMUSCULAR | Status: DC | PRN

## 2014-06-08 MED ORDER — FUROSEMIDE 10 MG/ML IJ SOLN
80.0000 mg | Freq: Two times a day (BID) | INTRAMUSCULAR | Status: DC
  Administered 2014-06-08 – 2014-06-10 (×4): 80 mg via INTRAVENOUS
  Filled 2014-06-08 (×5): qty 8

## 2014-06-08 MED ORDER — IPRATROPIUM-ALBUTEROL 0.5-2.5 (3) MG/3ML IN SOLN
3.0000 mL | RESPIRATORY_TRACT | Status: DC
  Administered 2014-06-08: 3 mL via RESPIRATORY_TRACT
  Filled 2014-06-08: qty 3

## 2014-06-08 MED ORDER — ONDANSETRON HCL 4 MG PO TABS: 4.0000 mg | ORAL_TABLET | Freq: Four times a day (QID) | ORAL | Status: DC | PRN

## 2014-06-08 MED ORDER — GUAIFENESIN-DM 100-10 MG/5ML PO SYRP
5.0000 mL | ORAL_SOLUTION | ORAL | Status: DC | PRN
  Administered 2014-06-08 (×2): 5 mL via ORAL
  Filled 2014-06-08 (×2): qty 5

## 2014-06-08 MED ORDER — ONDANSETRON HCL 4 MG/2ML IJ SOLN
4.0000 mg | Freq: Four times a day (QID) | INTRAMUSCULAR | Status: DC | PRN
  Administered 2014-06-08 – 2014-06-09 (×2): 4 mg via INTRAVENOUS
  Filled 2014-06-08 (×2): qty 2

## 2014-06-08 MED ORDER — IPRATROPIUM-ALBUTEROL 0.5-2.5 (3) MG/3ML IN SOLN
3.0000 mL | Freq: Two times a day (BID) | RESPIRATORY_TRACT | Status: DC
  Administered 2014-06-08: 3 mL via RESPIRATORY_TRACT
  Filled 2014-06-08 (×2): qty 3

## 2014-06-08 MED ORDER — METHYLPREDNISOLONE SODIUM SUCC 125 MG IJ SOLR
80.0000 mg | Freq: Once | INTRAMUSCULAR | Status: AC
  Administered 2014-06-08: 80 mg via INTRAVENOUS
  Filled 2014-06-08: qty 2

## 2014-06-08 MED ORDER — PANTOPRAZOLE SODIUM 20 MG PO TBEC
20.0000 mg | DELAYED_RELEASE_TABLET | Freq: Every day | ORAL | Status: DC
  Filled 2014-06-08 (×3): qty 1

## 2014-06-08 MED ORDER — ACETAMINOPHEN 325 MG PO TABS: 650.0000 mg | ORAL_TABLET | Freq: Four times a day (QID) | ORAL | Status: DC | PRN

## 2014-06-08 NOTE — Progress Notes (Signed)
  Echocardiogram 2D Echocardiogram has been performed.  [place], [place] 06/08/2014, 4:15 PM

## 2014-06-08 NOTE — Progress Notes (Signed)
UR chart review completed.  

## 2014-06-08 NOTE — Progress Notes (Signed)
Pt admitted to floor with foley. Upon assessment of skin, pt found with a cyst/skin tag on left back side under arm. Pt also has multiple bruises to the face, on both arms and a burn on the left arm. Pt states she has fallen at home twice. Upon administering night medications, pt stated she took the meds at home previously. Pt educated on accuchecks and administration of insulin.   Pt states she is giving her home meds, cellular phone, and purse to her husband to take home.   Pt educated on fall prevention and verbalized that she would call the nurse or nurse tech upon getting up from bed.

## 2014-06-08 NOTE — Progress Notes (Signed)
[facility] PROGRESS NOTE  Brenda Lee [id number] DOB: 03-Jul-1950 DOA: 06/07/2014 PCP: Despina Hick, MD  Assessment/Plan:  ARF (acute renal failure): likely acute on chronic and likely multifactorial i.e. Dehydration in setting of diuretics and other home medications and hx hypertension and diabetes. Denies symptoms and reports no hx CKD. Spoke to PCP office and BMET since 2013  so difficult to determine baseline . Will hold nephrotoxins, provide gentile IV fluids and obtain renal US hx stones and urinary retention. Monitor I's and O's. Renal consult Active Problems:    Syncope and collapse: likely related to dehydration and ortho stasis. CT head negative. No events on tele. Continue gently hydration and holding diuretics.     Dyspnea: 6 week hx and reports completing 1 round antibiotics and 2 steroid tapers with no improvement. Chest xray without infiltrate or edema. Hg is 7.  Oxygen saturation level >90%. BS diminished on exam. Will provide nebs and one dose solumedrol. No leukocytosis, afebrile and non-toxic. If no improvement repeat chest xray in am. Consider transfusion.    Normocytic anemia: spoke with PCP office and am awaiting Hg trend. Baseline unknown. Likely related to chronic disease. Will obtain FOBT. Await anemia panel.  Concern for contributing to #3. Consider transfuison    Diabetes mellitus without complication: PCP report A1c 5.4 last month. Holding oral agents for now. Use SSI. Anticipate increase due to steroids. Will monitor.     Essential hypertension: fair control holding home ACE and HCTZ and triamterene as well as ARB and CCB. Continue BB. Monitor. Will consider resuming amlodipine as indicated.     Hyperlipidemia: await lipid panel. Continue statin    Edema extremities: may be related to #1. No chest pain. Troponin negative. No events on tele. Monitor.      Code Status: full Family Communication: daughter at bedside Disposition Plan: home when  ready   Consultants:  nephrology  Procedures:    Antibiotics:  none  HPI/Subjective: Denies pain discomfort. Reports continued sob  Objective: Filed Vitals:   06/08/14 0817  BP: 154/65  Pulse: 74  Temp: 98.2 F (36.8 C)  Resp:     Intake/Output Summary (Last 24 hours) at 06/08/14 0958 Last data filed at 06/08/14 0817  Gross per 24 hour  Intake      0 ml  Output    550 ml  Net   -550 ml   Filed Weights   06/07/14 2119 06/08/14 0050  Weight: 84.369 kg (186 lb) 82.2 kg (181 lb 3.5 oz)    Exam:   General:  Well nourished. NAD  Cardiovascular: RRR No MGR 1-2+ LE edema  Respiratory: mild increased work of breathing at rest. BS quite diminished in bilateral bases. No wheeze rhonchi otherwise  Abdomen: obese soft +BS non-tender to palpation  Musculoskeletal: joints without swelling/erythema   Data Reviewed: Basic Metabolic Panel:  Recent Labs Lab 06/07/14 2120 06/08/14 0546  NA 137 136  K 4.1 4.7  CL 110 110  CO2 18* 18*  GLUCOSE 91 197*  BUN 64* 66*  CREATININE 6.55* 6.55*  CALCIUM 7.9* 7.8*   Liver Function Tests: No results for input(s): AST, ALT, ALKPHOS, BILITOT, PROT, ALBUMIN in the last 168 hours. No results for input(s): LIPASE, AMYLASE in the last 168 hours. No results for input(s): AMMONIA in the last 168 hours. CBC:  Recent Labs Lab 06/07/14 2120 06/08/14 0546  WBC 5.6 4.9  NEUTROABS 3.7  --   HGB 7.5* 7.0*  HCT 23.6* 21.8*  MCV 87.4  87.6  PLT 292 255   Cardiac Enzymes:  Recent Labs Lab 06/07/14 2120  TROPONINI <0.03   BNP (last 3 results) No results for input(s): PROBNP in the last 8760 hours. CBG:  Recent Labs Lab 06/08/14 0100 06/08/14 0412 06/08/14 0738  GLUCAP 197* 191* 164*    No results found for this or any previous visit (from the past 240 hour(s)).   Studies: Dg Chest 2 View  06/07/2014   CLINICAL DATA:  Productive cough for 3 weeks. Vomiting. Near syncope tonight. Multiple falls over the last few  days.  EXAM: CHEST  2 VIEW  COMPARISON:  04/25/2014  FINDINGS: Shallow inspiration with elevation of the right hemidiaphragm. Normal heart size and pulmonary vascularity. No focal airspace disease or consolidation in the lungs. No blunting of costophrenic angles. No pneumothorax. Mediastinal contours appear intact. Degenerative changes in the spine. Calcified and tortuous aorta.  IMPRESSION: No active cardiopulmonary disease.   Electronically Signed   By: Lucienne Capers M.D.   On: 06/07/2014 22:11   Ct Head Wo Contrast  06/07/2014   CLINICAL DATA:  Near syncope tonight. Symptoms are exacerbated when bending forward to. Multiple falls over the past few days. Facial bruising.  EXAM: CT HEAD WITHOUT CONTRAST  TECHNIQUE: Contiguous axial images were obtained from the base of the skull through the vertex without intravenous contrast.  COMPARISON:  None.  FINDINGS: Diffuse cerebral atrophy. Mild ventricular dilatation consistent with central atrophy. Patchy periventricular changes in the deep white matter consistent with small vessel ischemia. No mass effect or midline shift. No abnormal extra-axial fluid collections. Gray-white matter junctions are distinct. Basal cisterns are not effaced. No evidence of acute intracranial hemorrhage. No depressed skull fractures. Visualized paranasal sinuses and mastoid air cells are not opacified. Vascular calcifications.  IMPRESSION: No acute intracranial abnormalities. Chronic atrophy and small vessel ischemic changes.   Electronically Signed   By: Lucienne Capers M.D.   On: 06/07/2014 22:13    Scheduled Meds: . atenolol  75 mg Oral BID  . atorvastatin  10 mg Oral QHS  . heparin  5,000 Units Subcutaneous 3 times per day  . insulin aspart  0-9 Units Subcutaneous 6 times per day  . ipratropium-albuterol  3 mL Nebulization Q4H  . methylPREDNISolone (SOLU-MEDROL) injection  80 mg Intravenous Once  . pantoprazole  40 mg Oral Daily  . sodium chloride  3 mL Intravenous Q12H    Continuous Infusions: . sodium chloride 75 mL/hr at 06/08/14 0121    Principal Problem:     Time spent: 45 minutes    [facility] Pager [phone]. If 7PM-7AM, please contact night-coverage at [email or web address], password [facility] 06/08/2014, 9:58 AM  LOS: 1 day

## 2014-06-08 NOTE — Consult Note (Addendum)
Reason for Consult: Renal failure Referring Physician: Dr. Onnie Graham is an 64 y.o. female.  HPI: She is a patient who has long-standing history of diabetes, hypertension presently came with complaints of dizziness, and passing out last 2 days. According to the patient she was having some cough and sputum production since Thanksgiving. Patient was treated with antibiotics 2 times with some improvement. Recently however patient started having some diarrhea mostly in the evening it seems to be associated with Glucophage. She usually goes 2-3 times a day. She has also some nausea but no vomiting. Recently patient started having some dizziness and passing out. Showed no definite short was as this was related to her hypertension or blood sugar. Patient also states that her blood sugar recently has been going down. She denies any previous history of renal failure however she has history of kidney stone status post removal by urology.  Past Medical History  Diagnosis Date  . Diabetes mellitus without complication   . Hypertension   . Anemia   . CKD (chronic kidney disease)     History reviewed. No pertinent past surgical history.  History reviewed. No pertinent family history.  Social History:  reports that she has never smoked. She does not have any smokeless tobacco history on file. She reports that she does not drink alcohol. Her drug history is not on file.  Allergies:  Allergies  Allergen Reactions  . Morphine And Related Nausea And Vomiting    Medications: I have reviewed the patient's current medications.  Results for orders placed or performed during the hospital encounter of 06/07/14 (from the past 48 hour(s))  CBC with Differential     Status: Abnormal   Collection Time: 06/07/14  9:20 PM  Result Value Ref Range   WBC 5.6 4.0 - 10.5 K/uL   RBC 2.70 (L) 3.87 - 5.11 MIL/uL   Hemoglobin 7.5 (L) 12.0 - 15.0 g/dL   HCT 23.6 (L) 36.0 - 46.0 %   MCV 87.4 78.0 - 100.0 fL    MCH 27.8 26.0 - 34.0 pg   MCHC 31.8 30.0 - 36.0 g/dL   RDW 14.4 11.5 - 15.5 %   Platelets 292 150 - 400 K/uL   Neutrophils Relative % 66 43 - 77 %   Neutro Abs 3.7 1.7 - 7.7 K/uL   Lymphocytes Relative 19 12 - 46 %   Lymphs Abs 1.0 0.7 - 4.0 K/uL   Monocytes Relative 8 3 - 12 %   Monocytes Absolute 0.5 0.1 - 1.0 K/uL   Eosinophils Relative 6 (H) 0 - 5 %   Eosinophils Absolute 0.3 0.0 - 0.7 K/uL   Basophils Relative 1 0 - 1 %   Basophils Absolute 0.1 0.0 - 0.1 K/uL  Troponin I     Status: None   Collection Time: 06/07/14  9:20 PM  Result Value Ref Range   Troponin I <0.03 <0.031 ng/mL    Comment:        NO INDICATION OF MYOCARDIAL INJURY. Please note change in reference range.   Basic metabolic panel     Status: Abnormal   Collection Time: 06/07/14  9:20 PM  Result Value Ref Range   Sodium 137 135 - 145 mmol/L    Comment: Please note change in reference range.   Potassium 4.1 3.5 - 5.1 mmol/L    Comment: Please note change in reference range.   Chloride 110 96 - 112 mEq/L   CO2 18 (L) 19 - 32 mmol/L  Glucose, Bld 91 70 - 99 mg/dL   BUN 64 (H) 6 - 23 mg/dL   Creatinine, Ser 6.55 (H) 0.50 - 1.10 mg/dL   Calcium 7.9 (L) 8.4 - 10.5 mg/dL   GFR calc non Af Amer 6 (L) >90 mL/min   GFR calc Af Amer 7 (L) >90 mL/min    Comment: (NOTE) The eGFR has been calculated using the CKD EPI equation. This calculation has not been validated in all clinical situations. eGFR's persistently <90 mL/min signify possible Chronic Kidney Disease.    Anion gap 9 5 - 15  Urinalysis, Routine w reflex microscopic     Status: Abnormal   Collection Time: 06/07/14 10:45 PM  Result Value Ref Range   Color, Urine YELLOW YELLOW   APPearance HAZY (A) CLEAR   Specific Gravity, Urine 1.025 1.005 - 1.030   pH 6.0 5.0 - 8.0   Glucose, UA NEGATIVE NEGATIVE mg/dL   Hgb urine dipstick TRACE (A) NEGATIVE   Bilirubin Urine NEGATIVE NEGATIVE   Ketones, ur NEGATIVE NEGATIVE mg/dL   Protein, ur 100 (A)  NEGATIVE mg/dL   Urobilinogen, UA 0.2 0.0 - 1.0 mg/dL   Nitrite NEGATIVE NEGATIVE   Leukocytes, UA NEGATIVE NEGATIVE  Urine microscopic-add on     Status: Abnormal   Collection Time: 06/07/14 10:45 PM  Result Value Ref Range   Squamous Epithelial / LPF MANY (A) RARE   WBC, UA 3-6 <3 WBC/hpf   RBC / HPF 3-6 <3 RBC/hpf   Bacteria, UA MANY (A) RARE  Glucose, capillary     Status: Abnormal   Collection Time: 06/08/14  1:00 AM  Result Value Ref Range   Glucose-Capillary 197 (H) 70 - 99 mg/dL  Glucose, capillary     Status: Abnormal   Collection Time: 06/08/14  4:12 AM  Result Value Ref Range   Glucose-Capillary 191 (H) 70 - 99 mg/dL  Lipid panel     Status: Abnormal   Collection Time: 06/08/14  5:40 AM  Result Value Ref Range   Cholesterol 169 0 - 200 mg/dL   Triglycerides 112 <150 mg/dL   HDL 29 (L) >39 mg/dL   Total CHOL/HDL Ratio 5.8 RATIO   VLDL 22 0 - 40 mg/dL   LDL Cholesterol 118 (H) 0 - 99 mg/dL    Comment:        Total Cholesterol/HDL:CHD Risk Coronary Heart Disease Risk Table                     Men   Women  1/2 Average Risk   3.4   3.3  Average Risk       5.0   4.4  2 X Average Risk   9.6   7.1  3 X Average Risk  23.4   11.0        Use the calculated Patient Ratio above and the CHD Risk Table to determine the patient's CHD Risk.        ATP III CLASSIFICATION (LDL):  <100     mg/dL   Optimal  100-129  mg/dL   Near or Above                    Optimal  130-159  mg/dL   Borderline  160-189  mg/dL   High  >190     mg/dL   Very High   Basic metabolic panel     Status: Abnormal   Collection Time: 06/08/14  5:46 AM  Result Value Ref  Range   Sodium 136 135 - 145 mmol/L    Comment: Please note change in reference range.   Potassium 4.7 3.5 - 5.1 mmol/L    Comment: Please note change in reference range.   Chloride 110 96 - 112 mEq/L   CO2 18 (L) 19 - 32 mmol/L   Glucose, Bld 197 (H) 70 - 99 mg/dL   BUN 66 (H) 6 - 23 mg/dL   Creatinine, Ser 6.55 (H) 0.50 - 1.10  mg/dL   Calcium 7.8 (L) 8.4 - 10.5 mg/dL   GFR calc non Af Amer 6 (L) >90 mL/min   GFR calc Af Amer 7 (L) >90 mL/min    Comment: (NOTE) The eGFR has been calculated using the CKD EPI equation. This calculation has not been validated in all clinical situations. eGFR's persistently <90 mL/min signify possible Chronic Kidney Disease.    Anion gap 8 5 - 15  CBC     Status: Abnormal   Collection Time: 06/08/14  5:46 AM  Result Value Ref Range   WBC 4.9 4.0 - 10.5 K/uL   RBC 2.49 (L) 3.87 - 5.11 MIL/uL   Hemoglobin 7.0 (L) 12.0 - 15.0 g/dL   HCT 21.8 (L) 36.0 - 46.0 %   MCV 87.6 78.0 - 100.0 fL   MCH 28.1 26.0 - 34.0 pg   MCHC 32.1 30.0 - 36.0 g/dL   RDW 14.3 11.5 - 15.5 %   Platelets 255 150 - 400 K/uL  Reticulocytes     Status: Abnormal   Collection Time: 06/08/14  5:46 AM  Result Value Ref Range   Retic Ct Pct 2.1 0.4 - 3.1 %   RBC. 2.49 (L) 3.87 - 5.11 MIL/uL   Retic Count, Manual 52.3 19.0 - 186.0 K/uL  TSH     Status: None   Collection Time: 06/08/14  5:46 AM  Result Value Ref Range   TSH 1.646 0.350 - 4.500 uIU/mL  Glucose, capillary     Status: Abnormal   Collection Time: 06/08/14  7:38 AM  Result Value Ref Range   Glucose-Capillary 164 (H) 70 - 99 mg/dL  Glucose, capillary     Status: Abnormal   Collection Time: 06/08/14 11:39 AM  Result Value Ref Range   Glucose-Capillary 209 (H) 70 - 99 mg/dL   Comment 1 Notify RN     Dg Chest 2 View  06/07/2014   CLINICAL DATA:  Productive cough for 3 weeks. Vomiting. Near syncope tonight. Multiple falls over the last few days.  EXAM: CHEST  2 VIEW  COMPARISON:  04/25/2014  FINDINGS: Shallow inspiration with elevation of the right hemidiaphragm. Normal heart size and pulmonary vascularity. No focal airspace disease or consolidation in the lungs. No blunting of costophrenic angles. No pneumothorax. Mediastinal contours appear intact. Degenerative changes in the spine. Calcified and tortuous aorta.  IMPRESSION: No active  cardiopulmonary disease.   Electronically Signed   By: Lucienne Capers M.D.   On: 06/07/2014 22:11   Ct Head Wo Contrast  06/07/2014   CLINICAL DATA:  Near syncope tonight. Symptoms are exacerbated when bending forward to. Multiple falls over the past few days. Facial bruising.  EXAM: CT HEAD WITHOUT CONTRAST  TECHNIQUE: Contiguous axial images were obtained from the base of the skull through the vertex without intravenous contrast.  COMPARISON:  None.  FINDINGS: Diffuse cerebral atrophy. Mild ventricular dilatation consistent with central atrophy. Patchy periventricular changes in the deep white matter consistent with small vessel ischemia. No mass effect or midline shift.  No abnormal extra-axial fluid collections. Gray-white matter junctions are distinct. Basal cisterns are not effaced. No evidence of acute intracranial hemorrhage. No depressed skull fractures. Visualized paranasal sinuses and mastoid air cells are not opacified. Vascular calcifications.  IMPRESSION: No acute intracranial abnormalities. Chronic atrophy and small vessel ischemic changes.   Electronically Signed   By: Lucienne Capers M.D.   On: 06/07/2014 22:13   US Renal  06/08/2014   CLINICAL DATA:  Acute on chronic renal failure. Nephrolithiasis. Diabetes and hypertension.  EXAM: RENAL/URINARY TRACT ULTRASOUND COMPLETE  COMPARISON:  None.  FINDINGS: Right Kidney:  Length: 12.8 cm. Diffusely increased renal parenchymal echogenicity. No mass or hydronephrosis visualized.  Left Kidney:  Length: 12.2 cm. Diffusely increased renal parenchymal echogenicity. No mass or hydronephrosis visualized.  Bladder:  Nearly completely empty, and therefore not well visualized.  IMPRESSION: No evidence of hydronephrosis.  Diffusely increased renal parenchymal echogenicity, consistent with medical renal disease.   Electronically Signed   By: Earle Gell M.D.   On: 06/08/2014 13:16    Review of Systems  Constitutional: Positive for fever and chills.   Respiratory: Positive for cough and sputum production.   Cardiovascular: Positive for leg swelling. Negative for orthopnea.  Gastrointestinal: Positive for nausea and diarrhea. Negative for vomiting and abdominal pain.  Genitourinary: Positive for dysuria. Negative for frequency.  Neurological: Positive for dizziness.   Blood pressure 154/65, pulse 74, temperature 98.2 F (36.8 C), temperature source Oral, resp. rate 17, height $RemoveBe'5\' 7"'[id number]$  (1.702 m), weight 82.2 kg (181 lb 3.5 oz), SpO2 94 %. Physical Exam  Constitutional: She is oriented to person, place, and time. No distress.  Eyes: No scleral icterus.  Neck: No JVD present.  Respiratory: No respiratory distress. She has no wheezes.  GI: She exhibits no distension.  Musculoskeletal: She exhibits edema.  Neurological: She is alert and oriented to person, place, and time.    Assessment/Plan: Problem #1Possible acute on chronic renal failure. The etiology for her renal failure could be multifactorial including prerenal syndrome associated with poor by mouth intake/diarrhea/diuretics. Patient also on ACE inhibitor and ARBS which contribute to renal failure. At this moment ATN and and other etiologies cannot be ruled out. Problem #2 proteinuria: At this moment seems to be non-nephrotic range. Since could be secondary to diabetes/hypertension. Other primary and secondary cause of proteinuria also need to be considered as possibilities. Problem #3 diabetes: Long-standing. Patient states that her blood sugar is reasonably controlled and her last hemoglobin A1c was 5.4. Problem #4 hypertension: Her blood pressure slightly high but overall controlled Problem #5 anemia Problem #6 leg edema: Bilateral and 1+. Patient denies previous history of CHF but she has been on Maxide. Problem #7 history of dizziness and passing out: Etiology as this moment not clear could be secondary to hypoglycemia versus hypotension. Problem #8 possible chronic renal failure.  Patient with history of kidney stone, proteinuria and also bilaterally echogenic kidneys consistent with chronic renal failure. Her baseline renal function has this moment is  known Problem #8 history of diarrhea: Most likely secondary to medication. Plan: We'll increase her IV fluid to 125 mL per hour We'll start patient on Lasix 80 mg IV twice a day We'll check ANA, complement, hepatitis B surface antigen, hepatitis C antibody We'll do 24-hour urine for protein and immunoelectrophoresis. We'll check her basic metabolic panel and phosphorus in the morning. Agree with iron studies.  Izzie Geers S 06/08/2014, 1:37 PM

## 2014-06-08 NOTE — H&P (Addendum)
[facility] Admission History and Physical       Brenda Lee [id number] DOB: 1950-10-11 DOA: 06/07/2014  Referring physician: EDP PCP: Despina Hick, MD  Specialists:   Chief Complaint: Passed Out  HPI: Brenda Lee is a 64 y.o. female with a history of DM2, and HTN who was brought to the [facility] ED due to a syncopal event this evening.   She was in her kitchen getting a glass of water and her husband heard the glass fall to the floor and she reports that she bent over to pick  Up the glass and she blacked out.   When she came to she was disoriented and did not recognize anyone and was taken to the ED.   She had a similar episode 1 week ago and collapsed and fell hitting the left side of her face  And arm.   She developed a bruise around the left eye that is still present.   She denies having any fevers or chills or chest pain or headaches.  She reports having a poor appetite and not drinking much fluid.   She also reports having diarrhea  And stomach upset from her Metformin Rx.  She has also been having low blood sugars lately.     Review of Systems:  Constitutional: No Weight Loss, No Weight Gain, Night Sweats, Fevers, Chills, Dizziness, Fatigue, or Generalized Weakness HEENT: No Headaches, Difficulty Swallowing,Tooth/Dental Problems,Sore Throat,  No Sneezing, Rhinitis, Ear Ache, Nasal Congestion, or Post Nasal Drip,  Cardio-vascular:  No Chest pain, Orthopnea, PND, +Edema in Lower Extremities, Anasarca, Dizziness, Palpitations  Resp: No Dyspnea, No DOE, No Productive Cough, No Non-Productive Cough, No Hemoptysis, No Wheezing.    GI: No Heartburn, Indigestion, Abdominal Pain, Nausea, Vomiting, +Diarrhea, Hematemesis, Hematochezia, Melena, Change in Bowel Habits,  Loss of Appetite  GU: No Dysuria, Change in Color of Urine, No Urgency or Frequency, No Flank pain.  Musculoskeletal: No Joint Pain or Swelling, No Decreased Range of Motion, No Back Pain.  Neurologic:  +Syncope, No Seizures, Muscle Weakness, Paresthesia, Vision Disturbance or Loss, No Diplopia, No Vertigo, No Difficulty Walking,  Skin: No Rash or Lesions. Psych: No Change in Mood or Affect, No Depression or Anxiety, No Memory loss, No Confusion, or Hallucinations   Past Medical History  Diagnosis Date  . Diabetes mellitus without complication   . Hypertension       History reviewed. No pertinent past surgical history.     Prior to Admission medications   Medication Sig Start Date End Date Taking? Authorizing Provider  amLODipine-valsartan (EXFORGE) 10-320 MG per tablet Take 1 tablet by mouth daily. 04/21/14   Historical Provider, MD  atenolol (TENORMIN) 50 MG tablet Take 75 mg by mouth 2 (two) times daily. 04/21/14   Historical Provider, MD  atorvastatin (LIPITOR) 10 MG tablet Take 10 mg by mouth at bedtime. 04/21/14   Historical Provider, MD  azithromycin (ZITHROMAX) 250 MG tablet Take 1 tablet (250 mg total) by mouth daily. Take first 2 tablets together, then 1 every day until finished. Patient not taking: Reported on 06/07/2014 04/25/14   Merryl Hacker, MD  clonazePAM (KLONOPIN) 0.5 MG tablet Take 0.5 mg by mouth 2 (two) times daily as needed. 04/21/14   Historical Provider, MD  fexofenadine (ALLEGRA) 180 MG tablet Take 180 mg by mouth daily. 05/01/14   Historical Provider, MD  fluticasone (FLONASE) 50 MCG/ACT nasal spray Place 2 sprays into both nostrils daily. 04/25/14   Merryl Hacker, MD  glimepiride (AMARYL) 2 MG tablet Take 2 mg by mouth daily. 04/21/14   Historical Provider, MD  lisinopril (PRINIVIL,ZESTRIL) 10 MG tablet Take 10 mg by mouth daily. 04/21/14   Historical Provider, MD  metFORMIN (GLUCOPHAGE) 500 MG tablet Take 500 mg by mouth daily. 04/21/14   Historical Provider, MD  sodium chloride (OCEAN) 0.65 % SOLN nasal spray Place 1 spray into both nostrils as needed for congestion. 04/25/14   Merryl Hacker, MD  triamterene-hydrochlorothiazide (MAXZIDE-25)  37.5-25 MG per tablet Take 1 tablet by mouth daily. 04/21/14   Historical Provider, MD      Allergies  Allergen Reactions  . Morphine And Related Nausea And Vomiting     Social History:  reports that she has never smoked. She does not have any smokeless tobacco history on file. She reports that she does not drink alcohol. Her drug history is not on file.     History reviewed. No pertinent family history.     Physical Exam:  GEN:  Pleasant Obese  64 y.o. Caucasian female examined and in no acute distress; cooperative with exam Filed Vitals:   06/07/14 2119 06/07/14 2130 06/07/14 2346  BP: 148/59 138/63 146/60  Pulse: 74 72 79  Temp: 97.7 F (36.5 C)    Resp: 22 25 18   Height: 5\' 7"  (1.702 m)    Weight: 84.369 kg (186 lb)    SpO2: 96% 94% 92%   Blood pressure 146/60, pulse 79, temperature 97.7 F (36.5 C), resp. rate 18, height 5\' 7"  (1.702 m), weight 84.369 kg (186 lb), SpO2 92 %. PSYCH: She is alert and oriented x4; does not appear anxious does not appear depressed; affect is normal HEENT: Normocephalic and + Mild Ecchymosis of Left Peri-Orbital Area   Mucous membranes pink; PERRLA; EOM intact; Fundi:  Benign;  No scleral icterus, Nares: Patent, Oropharynx: Clear, Fair Dentition,    Neck:  FROM, No Cervical Lymphadenopathy nor Thyromegaly or Carotid Bruit; No JVD; Breasts:: Not examined CHEST WALL: No tenderness CHEST: Normal respiration, clear to auscultation bilaterally HEART: Regular rate and rhythm; no murmurs rubs or gallops BACK: No kyphosis or scoliosis; No CVA tenderness ABDOMEN: Positive Bowel Sounds, Obese, Soft Non-Tender; No Masses, No Organomegaly, No Pannus; No Intertriginous candida. Rectal Exam: Not done EXTREMITIES: No Cyanosis, Clubbing, 2+ BLE Edema; No Ulcerations. Genitalia: not examined PULSES: 2+ and symmetric SKIN: Normal hydration no rash or ulceration CNS:  Alert ad Oriented x 4, No Focal Deficits Vascular: pulses palpable throughout     Labs on Admission:  Basic Metabolic Panel:  Recent Labs Lab 06/07/14 2120  NA 137  K 4.1  CL 110  CO2 18*  GLUCOSE 91  BUN 64*  CREATININE 6.55*  CALCIUM 7.9*   Liver Function Tests: No results for input(s): AST, ALT, ALKPHOS, BILITOT, PROT, ALBUMIN in the last 168 hours. No results for input(s): LIPASE, AMYLASE in the last 168 hours. No results for input(s): AMMONIA in the last 168 hours. CBC:  Recent Labs Lab 06/07/14 2120  WBC 5.6  NEUTROABS 3.7  HGB 7.5*  HCT 23.6*  MCV 87.4  PLT 292   Cardiac Enzymes:  Recent Labs Lab 06/07/14 2120  TROPONINI <0.03    BNP (last 3 results) No results for input(s): PROBNP in the last 8760 hours. CBG: No results for input(s): GLUCAP in the last 168 hours.  Radiological Exams on Admission: Dg Chest 2 View  06/07/2014   CLINICAL DATA:  Productive cough for 3 weeks. Vomiting. Near syncope tonight. Multiple falls  over the last few days.  EXAM: CHEST  2 VIEW  COMPARISON:  04/25/2014  FINDINGS: Shallow inspiration with elevation of the right hemidiaphragm. Normal heart size and pulmonary vascularity. No focal airspace disease or consolidation in the lungs. No blunting of costophrenic angles. No pneumothorax. Mediastinal contours appear intact. Degenerative changes in the spine. Calcified and tortuous aorta.  IMPRESSION: No active cardiopulmonary disease.   Electronically Signed   By: Lucienne Capers M.D.   On: 06/07/2014 22:11   Ct Head Wo Contrast  06/07/2014   CLINICAL DATA:  Near syncope tonight. Symptoms are exacerbated when bending forward to. Multiple falls over the past few days. Facial bruising.  EXAM: CT HEAD WITHOUT CONTRAST  TECHNIQUE: Contiguous axial images were obtained from the base of the skull through the vertex without intravenous contrast.  COMPARISON:  None.  FINDINGS: Diffuse cerebral atrophy. Mild ventricular dilatation consistent with central atrophy. Patchy periventricular changes in the deep white matter  consistent with small vessel ischemia. No mass effect or midline shift. No abnormal extra-axial fluid collections. Gray-white matter junctions are distinct. Basal cisterns are not effaced. No evidence of acute intracranial hemorrhage. No depressed skull fractures. Visualized paranasal sinuses and mastoid air cells are not opacified. Vascular calcifications.  IMPRESSION: No acute intracranial abnormalities. Chronic atrophy and small vessel ischemic changes.   Electronically Signed   By: Lucienne Capers M.D.   On: 06/07/2014 22:13     EKG: Independently reviewed.    Assessment/Plan:   64 y.o. female with  Principal Problem:   1.   ARF (acute renal failure)- due to ACE, and ARB and HCTZ Rx and Chronic Diarrhea from Metformin Rx along with Urinary Retention   Discontinue Valsartan, Triamterene/HCTZ, and Metformin Rx   IVFs for Rehydration   Monitor BUN/Cr   Straight Cath PRN   May Need Renal Consult if No Improvement   Active Problems:   2.   Syncope and collapse- due to Dehydration, Orthostatic Hypotension, CT Head Negative Acute findings   Telemetry Monitoring   Check Orthostatic vitals q shift   IVFs     3.   Normocytic anemia   Send Anemia Panel   Check FOBT q day x 3     4.   Diabetes mellitus without complication `  Discontinue Metformin due to Diarrhea dn Gi Distress   Holding Glimepiride   SSI coverage PRN   Check HbA1C in AM     5.   Essential hypertension   Continue Atenolol and Amlodipine as BP tolerates   Hold Valsartan and Triamterene/HCTZ due to #1   Monitor BPs     6.   Hyperlipidemia   Continue Atorvastatin Rx     7.   Edema extremities- diff Dx includes : CHF, Hypoabluminemia, Myxedema, and possibly Metformin Rx.      Check Albumin level   May need 2D ECHO for evaluation of possible CHF     8.   DVT Prophylaxis   SQ Heparin     9.  Pre-Hospital Ecchymosis of Left peri-Orbital area from syncope and fall 1 week ago    Code Status:    FULL CODE     Family Communication:    Family at Bedside Disposition Plan:   Inpatient Telemetry Unit       Time spent:  [facility] Pager [phone]   If [place] Please Contact the Day Rounding Team MD for [facility]  If 7PM-7AM, Please Contact Night-Floor Coverage  [email or web address] Password  [email or web address] 06/08/2014, 12:24 AM

## 2014-06-09 DIAGNOSIS — E872 Acidosis, unspecified: Secondary | ICD-10-CM | POA: Diagnosis present

## 2014-06-09 LAB — CBC WITH DIFFERENTIAL/PLATELET
Basophils Absolute: 0 10*3/uL (ref 0.0–0.1)
Basophils Relative: 0 % (ref 0–1)
EOS PCT: 0 % (ref 0–5)
Eosinophils Absolute: 0 10*3/uL (ref 0.0–0.7)
HCT: 24.5 % — ABNORMAL LOW (ref 36.0–46.0)
HEMOGLOBIN: 8 g/dL — AB (ref 12.0–15.0)
LYMPHS ABS: 1.1 10*3/uL (ref 0.7–4.0)
LYMPHS PCT: 13 % (ref 12–46)
MCH: 28.6 pg (ref 26.0–34.0)
MCHC: 32.7 g/dL (ref 30.0–36.0)
MCV: 87.5 fL (ref 78.0–100.0)
Monocytes Absolute: 0.5 10*3/uL (ref 0.1–1.0)
Monocytes Relative: 5 % (ref 3–12)
Neutro Abs: 7.1 10*3/uL (ref 1.7–7.7)
Neutrophils Relative %: 82 % — ABNORMAL HIGH (ref 43–77)
PLATELETS: 261 10*3/uL (ref 150–400)
RBC: 2.8 MIL/uL — AB (ref 3.87–5.11)
RDW: 14 % (ref 11.5–15.5)
WBC: 8.7 10*3/uL (ref 4.0–10.5)

## 2014-06-09 LAB — GLUCOSE, CAPILLARY
GLUCOSE-CAPILLARY: 188 mg/dL — AB (ref 70–99)
Glucose-Capillary: 139 mg/dL — ABNORMAL HIGH (ref 70–99)
Glucose-Capillary: 176 mg/dL — ABNORMAL HIGH (ref 70–99)
Glucose-Capillary: 199 mg/dL — ABNORMAL HIGH (ref 70–99)
Glucose-Capillary: 209 mg/dL — ABNORMAL HIGH (ref 70–99)
Glucose-Capillary: 252 mg/dL — ABNORMAL HIGH (ref 70–99)

## 2014-06-09 LAB — HEPATITIS C ANTIBODY: HCV Ab: NEGATIVE

## 2014-06-09 LAB — BLOOD GAS, ARTERIAL
Acid-base deficit: 8.9 mmol/L — ABNORMAL HIGH (ref 0.0–2.0)
BICARBONATE: 15.9 meq/L — AB (ref 20.0–24.0)
Drawn by: 22223
FIO2: 21 %
O2 Saturation: 92.1 %
PATIENT TEMPERATURE: 37
PH ART: 7.321 — AB (ref 7.350–7.450)
PO2 ART: 60.7 mmHg — AB (ref 80.0–100.0)
TCO2: 15.7 mmol/L (ref 0–100)
pCO2 arterial: 31.7 mmHg — ABNORMAL LOW (ref 35.0–45.0)

## 2014-06-09 LAB — MPO/PR-3 (ANCA) ANTIBODIES

## 2014-06-09 LAB — BASIC METABOLIC PANEL
ANION GAP: 8 (ref 5–15)
BUN: 63 mg/dL — ABNORMAL HIGH (ref 6–23)
CALCIUM: 7.9 mg/dL — AB (ref 8.4–10.5)
CO2: 17 mmol/L — ABNORMAL LOW (ref 19–32)
Chloride: 111 mEq/L (ref 96–112)
Creatinine, Ser: 6.56 mg/dL — ABNORMAL HIGH (ref 0.50–1.10)
GFR calc Af Amer: 7 mL/min — ABNORMAL LOW (ref >90)
GFR, EST NON AFRICAN AMERICAN: 6 mL/min — AB (ref >90)
Glucose, Bld: 161 mg/dL — ABNORMAL HIGH (ref 70–99)
POTASSIUM: 4.4 mmol/L (ref 3.5–5.1)
Sodium: 136 mmol/L (ref 135–145)

## 2014-06-09 LAB — HEMOGLOBIN A1C
Hgb A1c MFr Bld: 5.3 % (ref <5.7)
MEAN PLASMA GLUCOSE: 105 mg/dL (ref <117)

## 2014-06-09 LAB — CBC
HEMATOCRIT: 21.1 % — AB (ref 36.0–46.0)
HEMOGLOBIN: 6.8 g/dL — AB (ref 12.0–15.0)
MCH: 27.9 pg (ref 26.0–34.0)
MCHC: 32.2 g/dL (ref 30.0–36.0)
MCV: 86.5 fL (ref 78.0–100.0)
Platelets: 256 10*3/uL (ref 150–400)
RBC: 2.44 MIL/uL — AB (ref 3.87–5.11)
RDW: 14.2 % (ref 11.5–15.5)
WBC: 6.3 10*3/uL (ref 4.0–10.5)

## 2014-06-09 LAB — ANA: ANA: NEGATIVE

## 2014-06-09 LAB — ABO/RH: ABO/RH(D): O POS

## 2014-06-09 LAB — PREPARE RBC (CROSSMATCH)

## 2014-06-09 LAB — PHOSPHORUS: Phosphorus: 5.7 mg/dL — ABNORMAL HIGH (ref 2.3–4.6)

## 2014-06-09 LAB — HEPATITIS B SURFACE ANTIGEN: Hepatitis B Surface Ag: NEGATIVE

## 2014-06-09 LAB — PTH, INTACT AND CALCIUM
Calcium, Total (PTH): 7.9 mg/dL — ABNORMAL LOW (ref 8.4–10.5)
PTH: 630 pg/mL — AB (ref 14–64)

## 2014-06-09 MED ORDER — INSULIN ASPART 100 UNIT/ML ~~LOC~~ SOLN
0.0000 [IU] | Freq: Three times a day (TID) | SUBCUTANEOUS | Status: DC
  Administered 2014-06-09 – 2014-06-10 (×3): 3 [IU] via SUBCUTANEOUS
  Administered 2014-06-10 – 2014-06-11 (×2): 2 [IU] via SUBCUTANEOUS
  Administered 2014-06-11: 3 [IU] via SUBCUTANEOUS
  Administered 2014-06-11: 2 [IU] via SUBCUTANEOUS
  Administered 2014-06-12 (×2): 3 [IU] via SUBCUTANEOUS
  Administered 2014-06-12: 2 [IU] via SUBCUTANEOUS
  Administered 2014-06-13: 5 [IU] via SUBCUTANEOUS
  Administered 2014-06-13: 2 [IU] via SUBCUTANEOUS
  Administered 2014-06-13 – 2014-06-14 (×2): 3 [IU] via SUBCUTANEOUS
  Administered 2014-06-14: 5 [IU] via SUBCUTANEOUS

## 2014-06-09 MED ORDER — INSULIN ASPART 100 UNIT/ML ~~LOC~~ SOLN
0.0000 [IU] | Freq: Every day | SUBCUTANEOUS | Status: DC
  Administered 2014-06-09: 2 [IU] via SUBCUTANEOUS

## 2014-06-09 MED ORDER — IPRATROPIUM-ALBUTEROL 0.5-2.5 (3) MG/3ML IN SOLN
3.0000 mL | RESPIRATORY_TRACT | Status: DC | PRN
  Administered 2014-06-10: 3 mL via RESPIRATORY_TRACT
  Filled 2014-06-09: qty 3

## 2014-06-09 MED ORDER — SODIUM CHLORIDE 0.9 % IV SOLN: Freq: Once | INTRAVENOUS | Status: DC

## 2014-06-09 MED ORDER — POLYSACCHARIDE IRON COMPLEX 150 MG PO CAPS
150.0000 mg | ORAL_CAPSULE | Freq: Two times a day (BID) | ORAL | Status: DC
  Administered 2014-06-09 – 2014-06-11 (×5): 150 mg via ORAL
  Filled 2014-06-09 (×5): qty 1

## 2014-06-09 MED ORDER — SODIUM CHLORIDE 0.9 % IV SOLN
Freq: Once | INTRAVENOUS | Status: AC
  Administered 2014-06-09: 13:00:00 via INTRAVENOUS

## 2014-06-09 MED ORDER — SODIUM BICARBONATE 650 MG PO TABS
650.0000 mg | ORAL_TABLET | Freq: Three times a day (TID) | ORAL | Status: DC
Start: 2014-06-09 — End: 2014-06-14
  Administered 2014-06-09 – 2014-06-14 (×16): 650 mg via ORAL
  Filled 2014-06-09 (×16): qty 1

## 2014-06-09 NOTE — Care Management Note (Signed)
    Page 1 of 1   06/12/2014     2:06:23 PM CARE MANAGEMENT NOTE 06/12/2014  Patient:  Brenda Lee, Brenda Lee   Account Number:  [id number]  Date Initiated:  06/09/2014  Documentation initiated by:  Theophilus Kinds  Subjective/Objective Assessment:   Pt admitted from home with ARF. Pt lives with herhusband and will return home at discharge. Pt is independent with ADL's.     Action/Plan:   Will continue to follow for discharge planning needs. Pt may require dialysis at discharge. If needed CSW will arrange.   Anticipated DC Date:  06/12/2014   Anticipated DC Plan:  [place]  CM consult      [facility] Choice  HOME HEALTH   Choice offered to / List presented to:  C-1 Patient        [place] arranged  [place] [facility]      [place].   Status of service:  Completed, signed off Medicare Important Message given?   (If response is "NO", the following Medicare IM given date fields will be blank) Date Medicare IM given:   Medicare IM given by:   Date Additional Medicare IM given:   Additional Medicare IM given by:    Discharge Disposition:  [place]  Per UR Regulation:    If discussed at Long Length of Stay Meetings, dates discussed:    Comments:  06/12/14 [place], RN BSN CM Pt potential discharge over the weekend. [place] [facility] arranged with [facility]. Weekend staff will need to call and fax orders once written. Pt and pts nurse aware of discharge arrangements.  06/09/14 [place], RN BSN CM

## 2014-06-09 NOTE — Progress Notes (Signed)
Critical value reported from night shift RN Oley Balm) for H/H of 6.8/21.1 at 7:30am 06/09/2014.  Reported to Dr. Maryland Pink at [facility] 06/09/2014.  Dr. Maryland Pink notified. Transfusion orders pending.  MD also notified of ABG results.  Read back and verified.

## 2014-06-09 NOTE — Progress Notes (Signed)
[facility] PROGRESS NOTE  DEBBY MEDDOCK [id number] DOB: 05-17-1951 DOA: 06/07/2014 PCP: Glenda Chroman., MD  Assessment/Plan: ARF (acute renal failure): likely acute on chronic and likely multifactorial i.e. ATN/ACE/ARB. Renal US reveals diffusely increased renal parenchymal echogenicity, consistent with medical renal disease No change in creatinine today. Urine output 1800. Evaluated by nephrology who opines CKD at least stage III. Follow recommendations of increased IV fluids and IV lasix. Of note, followed up with PCP office who reports having no labs except for A1c.   Active Problems:   Metabolic Acidosis: related to #1.  ABG with ph 7.31, CO2 31.7 and O2 60.7. Sodium bicarb po per renal. Will monitor.    Syncope and collapse: likely related to dehydration and orthostasis.. CT head negative. No events on tele. Continue gently hydration and holding diuretics.    Dyspnea: 6 week hx and reports completing 1 round antibiotics and 2 steroid tapers with no improvement. Chest xray without infiltrate or edema. Hg is 6.8. Oxygen saturation level >90%. BS diminished on exam. Likely related to #1 and #2. No leukocytosis, afebrile and non-toxic. ABG with O2 60.7. Provide oxygen supplementation. Expect improvement with correction of #2.    Normocytic anemia: likely related to chronic disease. No s/sx of active bleed.  Baseline unknown. Likely related to chronic disease. Await FOBT. Aemia panel with low iron.Transfuse 1 unit. monitor   Diabetes mellitus without complication: PCP report A1c 5.4 last month. Holding oral agents for now. Use SSI. Reportedly  Episode hypoglycemia prior to admission. Anticipating adjusting oral meds at discharge.    Essential hypertension: fair control holding home ACE and HCTZ and triamterene as well as ARB and CCB. Continue BB. Monitor. Will consider resuming amlodipine as indicated.    Hyperlipidemia: lipid panel HDL 29 and LDL chol 118 otherwise  unremarkable. Continue statin  Chronic diastolic heart failure with edema in extremities: No chest pain. Troponin negative. No events on tele. Echo with EF 60% and grade II diastolic dysfunction and mitral stenosis. BB as above.  Monitor monitor closely given fluids.      Code Status: full Family Communication: husband at bedside Disposition Plan: home when ready   Consultants:  renal  Procedures:  The estimated ejection fraction was in the range of 60% to 65%. Wall motion was normal; there were no regional wall motion abnormalities. Features are consistent with a pseudonormal left ventricular filling pattern, with concomitant abnormal relaxation and increased filling pressure (grade 2 diastolic dysfunction).  Antibiotics:  none  HPI/Subjective: Awake alert. Denies pain discomfort  Objective: Filed Vitals:   06/09/14 0500  BP: 147/64  Pulse: 84  Temp: 98.5 F (36.9 C)  Resp: 18    Intake/Output Summary (Last 24 hours) at 06/09/14 0912 Last data filed at 06/09/14 0700  Gross per 24 hour  Intake 1285.83 ml  Output   1325 ml  Net -39.17 ml   Filed Weights   06/07/14 2119 06/08/14 0050 06/09/14 0500  Weight: 84.369 kg (186 lb) 82.2 kg (181 lb 3.5 oz) 81.3 kg (179 lb 3.7 oz)    Exam:   General:  Well nourished NAD  Cardiovascular: RRR No MGR trace LE edema  Respiratory: mild increased work of breathing at rest no wheeze no rhonchi  Abdomen: obese soft +BS non-tender to palpation   Musculoskeletal: no clubbing or cyanosis   Data Reviewed: Basic Metabolic Panel:  Recent Labs Lab 06/07/14 2120 06/08/14 0546 06/09/14 0554  NA 137 136 136  K 4.1 4.7 4.4  CL 110 110  111  CO2 18* 18* 17*  GLUCOSE 91 197* 161*  BUN 64* 66* 63*  CREATININE 6.55* 6.55* 6.56*  CALCIUM 7.9* 7.8* 7.9*  PHOS  --   --  5.7*   Liver Function Tests: No results for input(s): AST, ALT, ALKPHOS, BILITOT, PROT, ALBUMIN in the last 168 hours. No results for  input(s): LIPASE, AMYLASE in the last 168 hours. No results for input(s): AMMONIA in the last 168 hours. CBC:  Recent Labs Lab 06/07/14 2120 06/08/14 0546 06/09/14 0554  WBC 5.6 4.9 6.3  NEUTROABS 3.7  --   --   HGB 7.5* 7.0* 6.8*  HCT 23.6* 21.8* 21.1*  MCV 87.4 87.6 86.5  PLT 292 255 256   Cardiac Enzymes:  Recent Labs Lab 06/07/14 2120  TROPONINI <0.03   BNP (last 3 results) No results for input(s): PROBNP in the last 8760 hours. CBG:  Recent Labs Lab 06/08/14 1620 06/08/14 2023 06/09/14 0024 06/09/14 0419 06/09/14 0758  GLUCAP 307* 305* 252* 176* 139*    No results found for this or any previous visit (from the past 240 hour(s)).   Studies: Dg Chest 2 View  06/07/2014   CLINICAL DATA:  Productive cough for 3 weeks. Vomiting. Near syncope tonight. Multiple falls over the last few days.  EXAM: CHEST  2 VIEW  COMPARISON:  04/25/2014  FINDINGS: Shallow inspiration with elevation of the right hemidiaphragm. Normal heart size and pulmonary vascularity. No focal airspace disease or consolidation in the lungs. No blunting of costophrenic angles. No pneumothorax. Mediastinal contours appear intact. Degenerative changes in the spine. Calcified and tortuous aorta.  IMPRESSION: No active cardiopulmonary disease.   Electronically Signed   By: Lucienne Capers M.D.   On: 06/07/2014 22:11   Ct Head Wo Contrast  06/07/2014   CLINICAL DATA:  Near syncope tonight. Symptoms are exacerbated when bending forward to. Multiple falls over the past few days. Facial bruising.  EXAM: CT HEAD WITHOUT CONTRAST  TECHNIQUE: Contiguous axial images were obtained from the base of the skull through the vertex without intravenous contrast.  COMPARISON:  None.  FINDINGS: Diffuse cerebral atrophy. Mild ventricular dilatation consistent with central atrophy. Patchy periventricular changes in the deep white matter consistent with small vessel ischemia. No mass effect or midline shift. No abnormal extra-axial  fluid collections. Gray-white matter junctions are distinct. Basal cisterns are not effaced. No evidence of acute intracranial hemorrhage. No depressed skull fractures. Visualized paranasal sinuses and mastoid air cells are not opacified. Vascular calcifications.  IMPRESSION: No acute intracranial abnormalities. Chronic atrophy and small vessel ischemic changes.   Electronically Signed   By: Lucienne Capers M.D.   On: 06/07/2014 22:13   US Renal  06/08/2014   CLINICAL DATA:  Acute on chronic renal failure. Nephrolithiasis. Diabetes and hypertension.  EXAM: RENAL/URINARY TRACT ULTRASOUND COMPLETE  COMPARISON:  None.  FINDINGS: Right Kidney:  Length: 12.8 cm. Diffusely increased renal parenchymal echogenicity. No mass or hydronephrosis visualized.  Left Kidney:  Length: 12.2 cm. Diffusely increased renal parenchymal echogenicity. No mass or hydronephrosis visualized.  Bladder:  Nearly completely empty, and therefore not well visualized.  IMPRESSION: No evidence of hydronephrosis.  Diffusely increased renal parenchymal echogenicity, consistent with medical renal disease.   Electronically Signed   By: Earle Gell M.D.   On: 06/08/2014 13:16    Scheduled Meds: . sodium chloride   Intravenous Once  . sodium chloride   Intravenous Once  . atenolol  75 mg Oral BID  . atorvastatin  10 mg Oral  QHS  . furosemide  80 mg Intravenous BID  . heparin  5,000 Units Subcutaneous 3 times per day  . insulin aspart  0-9 Units Subcutaneous 6 times per day  . iron polysaccharides  150 mg Oral BID  . pantoprazole  40 mg Oral Daily  . sodium bicarbonate  650 mg Oral TID  . sodium chloride  3 mL Intravenous Q12H   Continuous Infusions: . sodium chloride 125 mL/hr at 06/09/14 0844    Principal Problem:   ARF (acute renal failure) Active Problems:   Syncope and collapse   Normocytic anemia   Diabetes mellitus without complication   Essential hypertension   Hyperlipidemia   Edema extremities   Acute renal failure  syndrome   Faintness   Contusion of face   Dyspnea   Chronic diastolic heart failure    Time spent: 30 minutes    [facility] Pager [phone]. If 7PM-7AM, please contact night-coverage at [email or web address], password [facility] 06/09/2014, 9:12 AM  LOS: 2 days

## 2014-06-09 NOTE — Progress Notes (Signed)
Subjective: Interval History: has no complaint of nausea or vomiting. Her diarrhea has improved. Presently she denies any difficulty in breathing..  Objective: Vital signs in last 24 hours: Temp:  [97.5 F (36.4 C)-98.8 F (37.1 C)] 98.5 F (36.9 C) (01/05 0500) Pulse Rate:  [74-86] 84 (01/05 0500) Resp:  [17-18] 18 (01/05 0500) BP: (144-155)/(59-68) 147/64 mmHg (01/05 0500) SpO2:  [91 %-95 %] 95 % (01/05 0500) Weight:  [81.3 kg (179 lb 3.7 oz)] 81.3 kg (179 lb 3.7 oz) (01/05 0500) Weight change: -3.069 kg (-6 lb 12.3 oz)  Intake/Output from previous day: 01/04 0701 - 01/05 0700 In: 1405.8 [P.O.:360; I.V.:1045.8] Out: 1875 [Urine:1875] Intake/Output this shift:    General appearance: alert, cooperative and no distress Resp: clear to auscultation bilaterally Cardio: regular rate and rhythm, S1, S2 normal, no murmur, click, rub or gallop GI: soft, non-tender; bowel sounds normal; no masses,  no organomegaly Extremities: edema Trace edema bilaterally  Lab Results:  Recent Labs  06/08/14 0546 06/09/14 0554  WBC 4.9 6.3  HGB 7.0* 6.8*  HCT 21.8* 21.1*  PLT 255 256   BMET:  Recent Labs  06/08/14 0546 06/09/14 0554  NA 136 136  K 4.7 4.4  CL 110 111  CO2 18* 17*  GLUCOSE 197* 161*  BUN 66* 63*  CREATININE 6.55* 6.56*  CALCIUM 7.8* 7.9*   No results for input(s): PTH in the last 72 hours. Iron Studies:  Recent Labs  06/08/14 0546  IRON 24*  TIBC 221*  FERRITIN 121    Studies/Results: Dg Chest 2 View  06/07/2014   CLINICAL DATA:  Productive cough for 3 weeks. Vomiting. Near syncope tonight. Multiple falls over the last few days.  EXAM: CHEST  2 VIEW  COMPARISON:  04/25/2014  FINDINGS: Shallow inspiration with elevation of the right hemidiaphragm. Normal heart size and pulmonary vascularity. No focal airspace disease or consolidation in the lungs. No blunting of costophrenic angles. No pneumothorax. Mediastinal contours appear intact. Degenerative changes in the  spine. Calcified and tortuous aorta.  IMPRESSION: No active cardiopulmonary disease.   Electronically Signed   By: Lucienne Capers M.D.   On: 06/07/2014 22:11   Ct Head Wo Contrast  06/07/2014   CLINICAL DATA:  Near syncope tonight. Symptoms are exacerbated when bending forward to. Multiple falls over the past few days. Facial bruising.  EXAM: CT HEAD WITHOUT CONTRAST  TECHNIQUE: Contiguous axial images were obtained from the base of the skull through the vertex without intravenous contrast.  COMPARISON:  None.  FINDINGS: Diffuse cerebral atrophy. Mild ventricular dilatation consistent with central atrophy. Patchy periventricular changes in the deep white matter consistent with small vessel ischemia. No mass effect or midline shift. No abnormal extra-axial fluid collections. Gray-white matter junctions are distinct. Basal cisterns are not effaced. No evidence of acute intracranial hemorrhage. No depressed skull fractures. Visualized paranasal sinuses and mastoid air cells are not opacified. Vascular calcifications.  IMPRESSION: No acute intracranial abnormalities. Chronic atrophy and small vessel ischemic changes.   Electronically Signed   By: Lucienne Capers M.D.   On: 06/07/2014 22:13   US Renal  06/08/2014   CLINICAL DATA:  Acute on chronic renal failure. Nephrolithiasis. Diabetes and hypertension.  EXAM: RENAL/URINARY TRACT ULTRASOUND COMPLETE  COMPARISON:  None.  FINDINGS: Right Kidney:  Length: 12.8 cm. Diffusely increased renal parenchymal echogenicity. No mass or hydronephrosis visualized.  Left Kidney:  Length: 12.2 cm. Diffusely increased renal parenchymal echogenicity. No mass or hydronephrosis visualized.  Bladder:  Nearly completely empty, and therefore  not well visualized.  IMPRESSION: No evidence of hydronephrosis.  Diffusely increased renal parenchymal echogenicity, consistent with medical renal disease.   Electronically Signed   By: Earle Gell M.D.   On: 06/08/2014 13:16    I have reviewed  the patient's current medications.  Assessment/Plan: Problem #1 acute kidney injury superimposed on chronic. Presently her renal function seems to be stable. The etiology for her renal failure was felt to be secondary to prerenal syndrome/ATN/ACE/ARBS. Patient presently non-oliguric with 24-hour urine output of 1800 cc Problem #2 possible chronic renal failure. No previous blood work and is very difficult to determine the stage. Patient with bilateral increased diffused echogenicity/history of kidney stone/proteinuria. Problem #3 anemia: Possibly comminution of iron deficiency and anemia of chronic disease. Her hemoglobin and hematocrit declining. Iron saturation is 11% and ferritin is 121. Problem #4 diabetes Problem #5 hypertension: Her blood pressure is reasonably controlled Problem #6 metabolic acidosis: Most likely secondary to chronic renal failure Problem #7 tablet for disease: Calcium is range however her phosphorus is high. Presently she is not on a binder.  Plan: We'll start patient on Nu-Iron 150 mg by mouth twice a day We will increase her IV fluid to 125 mL per hour We'll check her basic metabolic panel and vitamin D level in the morning. We'll start patient on sodium bicarbonate 650 mg by mouth 3 times a day   LOS: 2 days   Montae Stager S 06/09/2014,8:20 AM

## 2014-06-09 NOTE — Progress Notes (Signed)
Inpatient Diabetes Program Recommendations  AACE/ADA: New Consensus Statement on Inpatient Glycemic Control (2013)  Target Ranges:  Prepandial:   less than 140 mg/dL      Peak postprandial:   less than 180 mg/dL (1-2 hours)      Critically ill patients:  140 - 180 mg/dL   Results for Brenda Lee, Brenda Lee (MRN [id number]) as of 06/09/2014 09:15  Ref. Range 06/08/2014 04:12 06/08/2014 07:38 06/08/2014 11:39 06/08/2014 16:20 06/08/2014 20:23 06/09/2014 00:24 06/09/2014 04:19 06/09/2014 07:58  Glucose-Capillary Latest Range: 70-99 mg/dL 191 (H) 164 (H) 209 (H) 307 (H) 305 (H) 252 (H) 176 (H) 139 (H)   Diabetes history: DM2  Outpatient Diabetes medications: Amaryl 1 mg QHS, Metformin 500 mg daily Current orders for Inpatient glycemic control: Novolog 0-9 units Q4H  Inpatient Diabetes Program Recommendations Correction (SSI): Please consider changing frequency of CBGs and Novolog correction to ACHS if patient is eating well and tolerating diet. Insulin - Meal Coverage: Noted patient received one time dose of Solumedrol 80 mg at 10:25 on 06/08/14 which is contributing to elevated post prandial glucose. Please consider ordering Novolog 4 units TID with meals for meal coverage if patient eating at least 50% of meals. Outpatient Diabetes Medication: Noted in H&P that patient reports having hypoglycemia at home. Please consider re-evaluating outpatient diabetes medication prior to discharge.  Thanks, Barnie Alderman, RN, MSN, CCRN, CDE Diabetes Coordinator Inpatient Diabetes Program [phone] (Team Pager) [phone] ([facility] office) [phone] [facility] office)

## 2014-06-10 ENCOUNTER — Inpatient Hospital Stay (HOSPITAL_COMMUNITY): Payer: BLUE CROSS/BLUE SHIELD

## 2014-06-10 DIAGNOSIS — J9601 Acute respiratory failure with hypoxia: Secondary | ICD-10-CM

## 2014-06-10 DIAGNOSIS — J9600 Acute respiratory failure, unspecified whether with hypoxia or hypercapnia: Secondary | ICD-10-CM | POA: Diagnosis not present

## 2014-06-10 LAB — BASIC METABOLIC PANEL
ANION GAP: 7 (ref 5–15)
BUN: 68 mg/dL — ABNORMAL HIGH (ref 6–23)
CHLORIDE: 109 meq/L (ref 96–112)
CO2: 20 mmol/L (ref 19–32)
Calcium: 7.9 mg/dL — ABNORMAL LOW (ref 8.4–10.5)
Creatinine, Ser: 6.71 mg/dL — ABNORMAL HIGH (ref 0.50–1.10)
GFR calc Af Amer: 7 mL/min — ABNORMAL LOW (ref >90)
GFR calc non Af Amer: 6 mL/min — ABNORMAL LOW (ref >90)
Glucose, Bld: 114 mg/dL — ABNORMAL HIGH (ref 70–99)
Potassium: 4.6 mmol/L (ref 3.5–5.1)
Sodium: 136 mmol/L (ref 135–145)

## 2014-06-10 LAB — BLOOD GAS, ARTERIAL
Acid-base deficit: 8.2 mmol/L — ABNORMAL HIGH (ref 0.0–2.0)
Acid-base deficit: 6.2 mmol/L — ABNORMAL HIGH (ref 0.0–2.0)
Bicarbonate: 17.4 mEq/L — ABNORMAL LOW (ref 20.0–24.0)
Bicarbonate: 19.3 mEq/L — ABNORMAL LOW (ref 20.0–24.0)
Drawn by: 23534
Drawn by: 23534
O2 CONTENT: 1.5 L/min
O2 CONTENT: 3 L/min
O2 SAT: 92.9 %
O2 SAT: 95.8 %
PCO2 ART: 42.2 mmHg (ref 35.0–45.0)
PO2 ART: 66.2 mmHg — AB (ref 80.0–100.0)
Patient temperature: 37
Patient temperature: 37
TCO2: 16.9 mmol/L (ref 0–100)
TCO2: 18.8 mmol/L (ref 0–100)
pCO2 arterial: 38.6 mmHg (ref 35.0–45.0)
pH, Arterial: 7.275 — ABNORMAL LOW (ref 7.350–7.450)
pH, Arterial: 7.283 — ABNORMAL LOW (ref 7.350–7.450)
pO2, Arterial: 79.1 mmHg — ABNORMAL LOW (ref 80.0–100.0)

## 2014-06-10 LAB — CBC
HEMATOCRIT: 24.2 % — AB (ref 36.0–46.0)
Hemoglobin: 7.9 g/dL — ABNORMAL LOW (ref 12.0–15.0)
MCH: 28.2 pg (ref 26.0–34.0)
MCHC: 32.6 g/dL (ref 30.0–36.0)
MCV: 86.4 fL (ref 78.0–100.0)
Platelets: 265 10*3/uL (ref 150–400)
RBC: 2.8 MIL/uL — ABNORMAL LOW (ref 3.87–5.11)
RDW: 14.2 % (ref 11.5–15.5)
WBC: 8.9 10*3/uL (ref 4.0–10.5)

## 2014-06-10 LAB — GLUCOSE, CAPILLARY
Glucose-Capillary: 119 mg/dL — ABNORMAL HIGH (ref 70–99)
Glucose-Capillary: 173 mg/dL — ABNORMAL HIGH (ref 70–99)
Glucose-Capillary: 133 mg/dL — ABNORMAL HIGH (ref 70–99)
Glucose-Capillary: 156 mg/dL — ABNORMAL HIGH (ref 70–99)

## 2014-06-10 LAB — COMPLEMENT, TOTAL: Compl, Total (CH50): >60 U/mL — ABNORMAL HIGH (ref 31–60)

## 2014-06-10 LAB — C3 COMPLEMENT: C3 COMPLEMENT: 100 mg/dL (ref 90–180)

## 2014-06-10 LAB — ANTISTREPTOLYSIN O TITER: ASO: 31 IU/mL (ref <409)

## 2014-06-10 LAB — C4 COMPLEMENT: COMPLEMENT C4, BODY FLUID: 20 mg/dL (ref 10–40)

## 2014-06-10 LAB — VITAMIN D 25 HYDROXY (VIT D DEFICIENCY, FRACTURES): VIT D 25 HYDROXY: 13 ng/mL — AB (ref 30.0–100.0)

## 2014-06-10 MED ORDER — NITROGLYCERIN 2 % TD OINT
0.2500 [in_us] | TOPICAL_OINTMENT | Freq: Once | TRANSDERMAL | Status: AC
  Administered 2014-06-10: 0.5 [in_us] via TOPICAL
  Filled 2014-06-10: qty 1

## 2014-06-10 MED ORDER — FUROSEMIDE 10 MG/ML IJ SOLN
80.0000 mg | INTRAMUSCULAR | Status: AC
  Administered 2014-06-10: 80 mg via INTRAVENOUS

## 2014-06-10 MED ORDER — FUROSEMIDE 10 MG/ML IJ SOLN
80.0000 mg | Freq: Three times a day (TID) | INTRAMUSCULAR | Status: DC
  Administered 2014-06-10 – 2014-06-11 (×4): 80 mg via INTRAVENOUS
  Filled 2014-06-10 (×4): qty 8

## 2014-06-10 NOTE — Progress Notes (Addendum)
Brenda Lee  MRN: [id number]  DOB/AGE: March 23, 1951 64 y.o.  Primary Care Physician:VYAS,DHRUV B., MD  Admit date: 06/07/2014  Chief Complaint:  Chief Complaint  Patient presents with  . Near Syncope  . Cough    S-Pt presented on  06/07/2014 with  Chief Complaint  Patient presents with  . Near Syncope  . Cough  .    Pt today feels better  Meds . sodium chloride   Intravenous Once  . atenolol  75 mg Oral BID  . atorvastatin  10 mg Oral QHS  . furosemide  80 mg Intravenous BID  . heparin  5,000 Units Subcutaneous 3 times per day  . insulin aspart  0-15 Units Subcutaneous TID WC  . insulin aspart  0-5 Units Subcutaneous QHS  . iron polysaccharides  150 mg Oral BID  . pantoprazole  40 mg Oral Daily  . sodium bicarbonate  650 mg Oral TID  . sodium chloride  3 mL Intravenous Q12H     Physical Exam: Vital signs in last 24 hours: Temp:  [97.9 F (36.6 C)-99 F (37.2 C)] 98.1 F (36.7 C) (01/06 0559) Pulse Rate:  [68-76] 69 (01/06 0559) Resp:  [18] 18 (01/06 0559) BP: (151-158)/(59-72) 158/59 mmHg (01/06 0559) SpO2:  [92 %-98 %] 96 % (01/06 0559) Weight:  [184 lb 1.4 oz (83.5 kg)] 184 lb 1.4 oz (83.5 kg) (01/06 0559) Weight change: 4 lb 13.6 oz (2.2 kg) Last BM Date: 06/07/14  Intake/Output from previous day: 01/05 0701 - 01/06 0700 In: 1110 [P.O.:800; Blood:310] Out: 2850 [Urine:2850]     Physical Exam: General- pt is awake,alert, oriented to time place and person Resp- Mild  acute REsp distress, Decreased BS at bases.Rhonchi + CVS- S1S2 regular in rate and rhythm GIT- BS+, soft, NT, ND EXT- NO LE Edema, NO Cyanosis   Lab Results: CBC  Recent Labs  06/09/14 1856 06/10/14 0549  WBC 8.7 8.9  HGB 8.0* 7.9*  HCT 24.5* 24.2*  PLT 261 265    BMET  Recent Labs  06/09/14 0554 06/10/14 0549  NA 136 136  K 4.4 4.6  CL 111 109  CO2 17* 20  GLUCOSE 161* 114*  BUN 63* 68*  CREATININE 6.56* 6.71*  CALCIUM 7.9* 7.9*   Trend Creat  2015  6.55=>6.71 NO previous data available    MICRO No results found for this or any previous visit (from the past 240 hour(s)).    Lab Results  Component Value Date   PTH 630* 06/08/2014   CALCIUM 7.9* 06/10/2014   PHOS 5.7* 06/09/2014     Autoimmune work up Hep B/C negative Complements normal ANCA negative ANA negative  Renal U/s Right Kidney:: 12.8 cm Left Kidney:: 12.2 cm.  Diffusely increased renal parenchymal echogenicity. No mass or hydronephrosis visualized.     Impression: 1)Renal  AKI secondary to ATN/AIN                AKI vs CKD progression ?               Creat minimally high.               CKD stage not sure as no other lab data .               CKD since no other lab data available to see when this started                I called Dr Vyas/Pt PCp office to see last  creat                No Serum Creat labs available                2013- Microalbuminuria present               CKD secondary to DM                Progression of CKD now marked with AKI vs CKD progression                Proteinura will check  2)HTN Medication- On Diuretics On Beta blockers    3)Anemia HGb NOt at goal (9--11) Iron deficiency Now on PO iron  4)CKD Mineral-Bone Disorder PTH elevated  Secondary Hyperparathyroidism  present Phosphorus not at goal.   5)CHF- Diastolic CHF Primary MD following  6)Electrolytes Normokalemic NOrmonatremic   7)Acid base Co2 not  at goal On PO Bicarb    Plan:  Will d/c IVF Agree with diuresis I  discussed GFr , breathing issues and CXr results with pt. If fluid status not better with diuresis will start Renal replacement therapy.   Addendum  pt made more than 2liters  NO need of Hd today  Sakari Raisanen S 06/10/2014, 8:58 AM

## 2014-06-10 NOTE — Progress Notes (Signed)
Notified Dr Maudie Mercury regarding pts labored breathing. Dr Maudie Mercury ordered to D/C IV fluids at this time, Stat Chest X Ray, and to administer 0800 IV Lasix now. Administering at this time. Pt states, "I do feel better after the breathing treatment, but am still having trouble". O2 sat is 93% at this time. Continuing to monitor pt very frequently. Bed is in lowest position and call bell within reach. Husband at bedside.

## 2014-06-10 NOTE — Progress Notes (Signed)
Husband called nurse due to pt having trouble breathing. O2 sat 92 % on 1L O2. Placed O2 on 2L [place]. Auscultated lung sounds, inspiratory wheezes. Some labored breathing at this time. Notified RT, requesting PRN Nebulizer treatment. Will continue to monitor pt.  Bed is in lowest position.

## 2014-06-10 NOTE — Progress Notes (Signed)
[facility] PROGRESS NOTE  Brenda Lee [id number] DOB: [birth year] DOA: 06/07/2014 PCP: Glenda Chroman., MD  Assessment/Plan:  Acute respiratory failure with hypoxia: likely related to acute on chronic diastolic heart failure secondary to IV fluids. ABG with ph 7.27, CO2 38.6, O2 66.2, bicarb 17.4. Chest xray with Lungs mildly hypoexpanded. New vascular congestion and mild cardiomegaly, with increased interstitial markings, concerning for pulmonary edema. IV fluids stopped during the night.  Will increase lasix every 8 hours vs every 12 hours and give additional dose now. continue oxygen supplementation add low dose nitro paste. Will repeat ABG this afternoon and re-evaluate. Monitor closely   Acute on Chronic diastolic heart failure. See #1.  No chest pain. Troponin negative. No events on tele. Echo with EF 60% and grade II diastolic dysfunction and mitral stenosis. BB as above. Volume status -1.9L. Weight 83.5kg up from 81.3kg yesterday. Lasix as above.    ARF (acute renal failure): likely acute on chronic and likely multifactorial i.e. ATN/ACE/ARB. Renal US reveals diffusely increased renal parenchymal echogenicity, consistent with medical renal disease. Creatinine tending up slightly today. Urine output 2850. Evaluated by nephrology who opines CKD at least stage III. Follow recommendations. Of note, followed up with PCP office who reports having no labs except for A1c.  Active Problems:  Metabolic Acidosis: related to #1. ABG as above. Contnue Sodium bicarb po per renal. Will monitor.    Syncope and collapse: likely related to dehydration and orthostasis.. CT head negative. No events on tele. Continue gently hydration and holding diuretics.    Dyspnea: 6 week hx and reports completing 1 round antibiotics and 2 steroid tapers with no improvement. See #1.  Chest xray on admission without infiltrate or edema. Hg is 6.8. Oxygen saturation level >90% on 3L. BS with crackles. No  leukocytosis, afebrile and non-toxic. Provide oxygen supplementation.     Normocytic anemia: likely related to chronic disease. No s/sx of active bleed. Hg 7.9 s/p 1 unit PRBC's. Await FOBT. Baseline unknown. Likely related to chronic disease. PO iron.  Aemia panel with low iron.   Diabetes mellitus without complication: PCP report A1c 5.4 last month. Holding oral agents for now. Use SSI. CBG range 119-173. Reportedly Episode hypoglycemia prior to admission. Anticipating adjusting oral meds at discharge.    Essential hypertension: fair control holding home ACE and HCTZ and triamterene as well as ARB and CCB. Continue BB. Monitor.     Hyperlipidemia: lipid panel HDL 29 and LDL chol 118 otherwise unremarkable. Continue statin   Code Status: full Family Communication: daughter at bedside Disposition Plan: home when ready   Consultants:  nephrology  Procedures:  2 d-echo The estimated ejection fraction was in the range of 60% to 65%. Wall motion was normal; there were no regional wall motion abnormalities. Features are consistent with a pseudonormal left ventricular filling pattern, with concomitant abnormal relaxation and increased filling pressure (grade 2 diastolic dysfunction  Antibiotics:  none  HPI/Subjective: Somewhat drowsy but quite responsive. Reports little improvement in sob since 3am  Objective: Filed Vitals:   06/10/14 1041  BP: 153/57  Pulse: 70  Temp:   Resp: 24    Intake/Output Summary (Last 24 hours) at 06/10/14 1121 Last data filed at 06/10/14 0800  Gross per 24 hour  Intake    750 ml  Output   2850 ml  Net  -2100 ml   Filed Weights   06/08/14 0050 06/09/14 0500 06/10/14 0559  Weight: 82.2 kg (181 lb 3.5 oz) 81.3 kg (179 lb  3.7 oz) 83.5 kg (184 lb 1.4 oz)    Exam:   General:  Somewhat ill appearing  Cardiovascular: S1 and S2 no MGR no LE edema  Respiratory: mild to moderate increased work of breathing at rest, using  abdominal accessory muscles, crackles heard bilateral bases  Abdomen: obese soft +BS non-tender to palpation  Musculoskeletal: joints without swelling/erythema   Data Reviewed: Basic Metabolic Panel:  Recent Labs Lab 06/07/14 2120 06/08/14 0546 06/08/14 1542 06/09/14 0554 06/10/14 0549  NA 137 136  --  136 136  K 4.1 4.7  --  4.4 4.6  CL 110 110  --  111 109  CO2 18* 18*  --  17* 20  GLUCOSE 91 197*  --  161* 114*  BUN 64* 66*  --  63* 68*  CREATININE 6.55* 6.55*  --  6.56* 6.71*  CALCIUM 7.9* 7.8* 7.9* 7.9* 7.9*  PHOS  --   --   --  5.7*  --    Liver Function Tests: No results for input(s): AST, ALT, ALKPHOS, BILITOT, PROT, ALBUMIN in the last 168 hours. No results for input(s): LIPASE, AMYLASE in the last 168 hours. No results for input(s): AMMONIA in the last 168 hours. CBC:  Recent Labs Lab 06/07/14 2120 06/08/14 0546 06/09/14 0554 06/09/14 1856 06/10/14 0549  WBC 5.6 4.9 6.3 8.7 8.9  NEUTROABS 3.7  --   --  7.1  --   HGB 7.5* 7.0* 6.8* 8.0* 7.9*  HCT 23.6* 21.8* 21.1* 24.5* 24.2*  MCV 87.4 87.6 86.5 87.5 86.4  PLT 292 255 256 261 265   Cardiac Enzymes:  Recent Labs Lab 06/07/14 2120  TROPONINI <0.03   BNP (last 3 results) No results for input(s): PROBNP in the last 8760 hours. CBG:  Recent Labs Lab 06/09/14 0758 06/09/14 1128 06/09/14 1624 06/09/14 2020 06/10/14 0713  GLUCAP 139* 199* 188* 209* 119*    No results found for this or any previous visit (from the past 240 hour(s)).   Studies: Dg Chest 1 View  06/10/2014   CLINICAL DATA:  Productive cough for 3 weeks. Syncope and sudden onset of shortness of breath. Initial encounter.  EXAM: CHEST - 1 VIEW  COMPARISON:  Chest radiograph from 06/07/2014  FINDINGS: The lungs are mildly hypoexpanded. New vascular congestion is noted, with increased interstitial markings, concerning for pulmonary edema. No definite pleural effusion or pneumothorax is seen.  The cardiomediastinal silhouette is mildly  enlarged. No acute osseous abnormalities are identified.  IMPRESSION: Lungs mildly hypoexpanded. New vascular congestion and mild cardiomegaly, with increased interstitial markings, concerning for pulmonary edema.   Electronically Signed   By: Garald Balding M.D.   On: 06/10/2014 02:57   US Renal  06/08/2014   CLINICAL DATA:  Acute on chronic renal failure. Nephrolithiasis. Diabetes and hypertension.  EXAM: RENAL/URINARY TRACT ULTRASOUND COMPLETE  COMPARISON:  None.  FINDINGS: Right Kidney:  Length: 12.8 cm. Diffusely increased renal parenchymal echogenicity. No mass or hydronephrosis visualized.  Left Kidney:  Length: 12.2 cm. Diffusely increased renal parenchymal echogenicity. No mass or hydronephrosis visualized.  Bladder:  Nearly completely empty, and therefore not well visualized.  IMPRESSION: No evidence of hydronephrosis.  Diffusely increased renal parenchymal echogenicity, consistent with medical renal disease.   Electronically Signed   By: Earle Gell M.D.   On: 06/08/2014 13:16    Scheduled Meds: . sodium chloride   Intravenous Once  . atenolol  75 mg Oral BID  . atorvastatin  10 mg Oral QHS  . furosemide  80 mg Intravenous 3 times per day  . heparin  5,000 Units Subcutaneous 3 times per day  . insulin aspart  0-15 Units Subcutaneous TID WC  . insulin aspart  0-5 Units Subcutaneous QHS  . iron polysaccharides  150 mg Oral BID  . pantoprazole  40 mg Oral Daily  . sodium bicarbonate  650 mg Oral TID  . sodium chloride  3 mL Intravenous Q12H   Continuous Infusions:   Principal Problem:   ARF (acute renal failure) Active Problems:   Syncope and collapse   Normocytic anemia   Diabetes mellitus without complication   Essential hypertension   Hyperlipidemia   Edema extremities   Acute renal failure syndrome   Faintness   Contusion of face   Dyspnea   Chronic diastolic heart failure   Metabolic acidosis   Acute respiratory failure    Time spent: 35 minutes    [facility] Pager [phone]. If 7PM-7AM, please contact night-coverage at [email or web address], password [email or web address] 06/10/2014, 11:21 AM  LOS: 3 days

## 2014-06-11 ENCOUNTER — Inpatient Hospital Stay (HOSPITAL_COMMUNITY): Payer: BLUE CROSS/BLUE SHIELD

## 2014-06-11 ENCOUNTER — Encounter (HOSPITAL_COMMUNITY): Payer: Self-pay

## 2014-06-11 DIAGNOSIS — I5031 Acute diastolic (congestive) heart failure: Secondary | ICD-10-CM | POA: Diagnosis present

## 2014-06-11 DIAGNOSIS — N390 Urinary tract infection, site not specified: Secondary | ICD-10-CM

## 2014-06-11 DIAGNOSIS — I4891 Unspecified atrial fibrillation: Secondary | ICD-10-CM

## 2014-06-11 DIAGNOSIS — I5033 Acute on chronic diastolic (congestive) heart failure: Secondary | ICD-10-CM

## 2014-06-11 HISTORY — DX: Unspecified atrial fibrillation: I48.91

## 2014-06-11 LAB — GLUCOSE, CAPILLARY
GLUCOSE-CAPILLARY: 142 mg/dL — AB (ref 70–99)
GLUCOSE-CAPILLARY: 159 mg/dL — AB (ref 70–99)
Glucose-Capillary: 127 mg/dL — ABNORMAL HIGH (ref 70–99)
Glucose-Capillary: 150 mg/dL — ABNORMAL HIGH (ref 70–99)
Glucose-Capillary: 202 mg/dL — ABNORMAL HIGH (ref 70–99)

## 2014-06-11 LAB — BASIC METABOLIC PANEL
Anion gap: 10 (ref 5–15)
BUN: 79 mg/dL — AB (ref 6–23)
CHLORIDE: 105 meq/L (ref 96–112)
CO2: 22 mmol/L (ref 19–32)
Calcium: 7.7 mg/dL — ABNORMAL LOW (ref 8.4–10.5)
Creatinine, Ser: 6.91 mg/dL — ABNORMAL HIGH (ref 0.50–1.10)
GFR calc Af Amer: 7 mL/min — ABNORMAL LOW (ref >90)
GFR calc non Af Amer: 6 mL/min — ABNORMAL LOW (ref >90)
GLUCOSE: 139 mg/dL — AB (ref 70–99)
POTASSIUM: 4.1 mmol/L (ref 3.5–5.1)
SODIUM: 137 mmol/L (ref 135–145)

## 2014-06-11 LAB — URINE CULTURE: Colony Count: >=100000

## 2014-06-11 LAB — CBC
HEMATOCRIT: 23.7 % — AB (ref 36.0–46.0)
HEMATOCRIT: 27.2 % — AB (ref 36.0–46.0)
HEMOGLOBIN: 9.2 g/dL — AB (ref 12.0–15.0)
Hemoglobin: 7.6 g/dL — ABNORMAL LOW (ref 12.0–15.0)
MCH: 27.5 pg (ref 26.0–34.0)
MCH: 29.2 pg (ref 26.0–34.0)
MCHC: 32.1 g/dL (ref 30.0–36.0)
MCHC: 33.8 g/dL (ref 30.0–36.0)
MCV: 85.9 fL (ref 78.0–100.0)
MCV: 86.3 fL (ref 78.0–100.0)
Platelets: 228 10*3/uL (ref 150–400)
Platelets: 238 10*3/uL (ref 150–400)
RBC: 2.76 MIL/uL — ABNORMAL LOW (ref 3.87–5.11)
RBC: 3.15 MIL/uL — ABNORMAL LOW (ref 3.87–5.11)
RDW: 14 % (ref 11.5–15.5)
RDW: 14 % (ref 11.5–15.5)
WBC: 5.8 10*3/uL (ref 4.0–10.5)
WBC: 5.6 10*3/uL (ref 4.0–10.5)

## 2014-06-11 LAB — MAGNESIUM: Magnesium: 2 mg/dL (ref 1.5–2.5)

## 2014-06-11 MED ORDER — VITAMIN D (ERGOCALCIFEROL) 1.25 MG (50000 UNIT) PO CAPS
50000.0000 [IU] | ORAL_CAPSULE | ORAL | Status: DC
  Administered 2014-06-11: 50000 [IU] via ORAL
  Filled 2014-06-11: qty 1

## 2014-06-11 MED ORDER — SODIUM CHLORIDE 0.45 % IV SOLN
INTRAVENOUS | Status: DC
  Administered 2014-06-11 – 2014-06-12 (×2): via INTRAVENOUS

## 2014-06-11 MED ORDER — METOPROLOL TARTRATE 1 MG/ML IV SOLN: 5.0000 mg | Freq: Once | INTRAVENOUS | Status: DC

## 2014-06-11 MED ORDER — TECHNETIUM TO 99M ALBUMIN AGGREGATED
6.0000 | Freq: Once | INTRAVENOUS | Status: AC | PRN
  Administered 2014-06-11: 6 via INTRAVENOUS

## 2014-06-11 MED ORDER — AMLODIPINE BESYLATE 5 MG PO TABS
5.0000 mg | ORAL_TABLET | Freq: Every day | ORAL | Status: DC
  Administered 2014-06-11: 5 mg via ORAL
  Filled 2014-06-11: qty 1

## 2014-06-11 MED ORDER — CALCITRIOL 0.25 MCG PO CAPS
0.5000 ug | ORAL_CAPSULE | Freq: Every day | ORAL | Status: DC
  Administered 2014-06-11 – 2014-06-14 (×4): 0.5 ug via ORAL
  Filled 2014-06-11 (×4): qty 2

## 2014-06-11 MED ORDER — SODIUM CHLORIDE 0.9 % IV SOLN
510.0000 mg | Freq: Once | INTRAVENOUS | Status: AC
  Administered 2014-06-11: 510 mg via INTRAVENOUS
  Filled 2014-06-11: qty 17

## 2014-06-11 MED ORDER — FUROSEMIDE 10 MG/ML IJ SOLN
40.0000 mg | Freq: Two times a day (BID) | INTRAMUSCULAR | Status: DC
  Administered 2014-06-11 – 2014-06-12 (×2): 40 mg via INTRAVENOUS
  Filled 2014-06-11 (×2): qty 4

## 2014-06-11 MED ORDER — TECHNETIUM TC 99M DIETHYLENETRIAME-PENTAACETIC ACID
40.0000 | Freq: Once | INTRAVENOUS | Status: AC | PRN
  Administered 2014-06-11: 40 via RESPIRATORY_TRACT

## 2014-06-11 MED ORDER — DILTIAZEM HCL 30 MG PO TABS
30.0000 mg | ORAL_TABLET | Freq: Four times a day (QID) | ORAL | Status: DC
  Administered 2014-06-11 – 2014-06-14 (×12): 30 mg via ORAL
  Filled 2014-06-11 (×13): qty 1

## 2014-06-11 MED ORDER — CEFTRIAXONE SODIUM IN DEXTROSE 40 MG/ML IV SOLN
2.0000 g | INTRAVENOUS | Status: DC
  Administered 2014-06-11 – 2014-06-13 (×3): 2 g via INTRAVENOUS
  Filled 2014-06-11 (×5): qty 50

## 2014-06-11 MED ORDER — DILTIAZEM HCL 25 MG/5ML IV SOLN
5.0000 mg | Freq: Once | INTRAVENOUS | Status: AC
Start: 2014-06-11 — End: 2014-06-11
  Administered 2014-06-11: 5 mg via INTRAVENOUS

## 2014-06-11 MED ORDER — DILTIAZEM HCL 25 MG/5ML IV SOLN
5.0000 mg | Freq: Once | INTRAVENOUS | Status: AC
  Administered 2014-06-11: 5 mg via INTRAVENOUS
  Filled 2014-06-11: qty 5

## 2014-06-11 MED ORDER — METOPROLOL TARTRATE 50 MG PO TABS
50.0000 mg | ORAL_TABLET | Freq: Two times a day (BID) | ORAL | Status: DC
  Administered 2014-06-11 – 2014-06-14 (×6): 50 mg via ORAL
  Filled 2014-06-11 (×6): qty 1

## 2014-06-11 MED ORDER — SODIUM CHLORIDE 0.9 % IV SOLN: Freq: Once | INTRAVENOUS | Status: DC

## 2014-06-11 MED ORDER — METOPROLOL TARTRATE 1 MG/ML IV SOLN: 5.0000 mg | INTRAVENOUS | Status: DC | PRN

## 2014-06-11 NOTE — Progress Notes (Signed)
[facility] PROGRESS NOTE  Brenda Lee [id number] DOB: 1950-06-09 DOA: 06/07/2014 PCP: Glenda Chroman., MD  Assessment/Plan: Acute respiratory failure with hypoxia: likely related to acute on chronic diastolic heart failure secondary to IV fluids. Much improved this am. Normal effort and sats 99% on 3 L. Will continue lasix every 8 hours for now and defer adjustment to nephrology. Continue oxygen supplementation but will wean as able. continue nitro paste.   Acute on Chronic diastolic heart failure.  Echo with EF 60% and grade II diastolic dysfunction and mitral stenosis. Volume status -5.3L. Weight 80.4kg down from 81.3kg yesterday. Lasix as above. Continue atenolol.   ARF (acute renal failure): likely acute on chronic and likely multifactorial i.e. ATN/ACE/ARB. Renal US reveals diffusely increased renal parenchymal echogenicity, consistent with medical renal disease. Creatinine tending up again today likely related to increase in lasix. Urine output 3900. Evaluated by nephrology who opines CKD at least stage III. Follow recommendations. Of note, followed up with PCP office who reports having no labs except for A1c.  Active Problems:  UTI: urine culture with klebsiella pneumoniae. Will start rocephin. She is afebrile and non-toxic appearing.   Metabolic Acidosis: related to #1. Resolved. Contnue Sodium bicarb po per renal. Will monitor.    Syncope and collapse: likely related to dehydration and orthostasis.. CT head negative. No events on tele. Continue gently hydration and holding diuretics.    Dyspnea: improved this am. See #1. Chest xray on admission without infiltrate or edema. Hg is 7.6. Oxygen saturation level >90% on 3L.  No leukocytosis, afebrile and non-toxic. Provide oxygen supplementation.    Normocytic anemia: likely related to chronic disease. No s/sx of active bleed. Hg 7.6 s/p 1 unit PRBC's. Await FOBT. Baseline unknown. Likely related to chronic disease. PO  iron. Aemia panel with low iron.will likely need OP GI consult and possible colonoscopy   Diabetes mellitus without complication: PCP report A1c 5.4 last month. Continue to  oral agents. Use SSI. CBG range 127-142. Reportedly Episode hypoglycemia prior to admission. Anticipating adjusting and/or discontinuing oral meds at discharge.    Essential hypertension: Only fair control. SBP range 141-159. holding home ACE and HCTZ and triamterene as well as ARB. Will resume CCB and Continue BB. Monitor.    Hyperlipidemia: lipid panel HDL 29 and LDL chol 118 otherwise unremarkable. Continue statin    Code Status: full Family Communication: husband at bedside Disposition Plan: home when ready hopefully 24-48 hours   Consultants:  nephrology  Procedures:  2 d-echo The estimated ejection fraction was in the range of 60% to 65%. Wall motion was normal; there were no regional wall motion abnormalities. Features are consistent with a pseudonormal left ventricular filling pattern, with concomitant abnormal relaxation and increased filling pressure (grade 2 diastolic dysfunction  Antibiotics:  none  HPI/Subjective: Reports feeling better and breathing "much better". Denies pain/discomfort. Complains fatigue and inability to sleep at night  Objective: Filed Vitals:   06/11/14 1047  BP: 100/68  Pulse: 138  Temp:   Resp:     Intake/Output Summary (Last 24 hours) at 06/11/14 1203 Last data filed at 06/11/14 0455  Gross per 24 hour  Intake    240 ml  Output   3900 ml  Net  -3660 ml   Filed Weights   06/09/14 0500 06/10/14 0559 06/11/14 0659  Weight: 81.3 kg (179 lb 3.7 oz) 83.5 kg (184 lb 1.4 oz) 80.423 kg (177 lb 4.8 oz)    Exam:   General:  Obese appear comfortable  Cardiovascular: RRR no MGR No LE edema  Respiratory: normal effort with conversation. BS with only fine crackles just in bases. No wheezing or use of accessory muscles  Abdomen: obese but non  distended.  soft +BS non-tender to palpation  Musculoskeletal:  No clubbing or cyanosis  Data Reviewed: Basic Metabolic Panel:  Recent Labs Lab 06/07/14 2120 06/08/14 0546 06/08/14 1542 06/09/14 0554 06/10/14 0549 06/11/14 0601  NA 137 136  --  136 136 137  K 4.1 4.7  --  4.4 4.6 4.1  CL 110 110  --  111 109 105  CO2 18* 18*  --  17* 20 22  GLUCOSE 91 197*  --  161* 114* 139*  BUN 64* 66*  --  63* 68* 79*  CREATININE 6.55* 6.55*  --  6.56* 6.71* 6.91*  CALCIUM 7.9* 7.8* 7.9* 7.9* 7.9* 7.7*  PHOS  --   --   --  5.7*  --   --    Liver Function Tests: No results for input(s): AST, ALT, ALKPHOS, BILITOT, PROT, ALBUMIN in the last 168 hours. No results for input(s): LIPASE, AMYLASE in the last 168 hours. No results for input(s): AMMONIA in the last 168 hours. CBC:  Recent Labs Lab 06/07/14 2120 06/08/14 0546 06/09/14 0554 06/09/14 1856 06/10/14 0549 06/11/14 0601  WBC 5.6 4.9 6.3 8.7 8.9 5.8  NEUTROABS 3.7  --   --  7.1  --   --   HGB 7.5* 7.0* 6.8* 8.0* 7.9* 7.6*  HCT 23.6* 21.8* 21.1* 24.5* 24.2* 23.7*  MCV 87.4 87.6 86.5 87.5 86.4 85.9  PLT 292 255 256 261 265 228   Cardiac Enzymes:  Recent Labs Lab 06/07/14 2120  TROPONINI <0.03   BNP (last 3 results) No results for input(s): PROBNP in the last 8760 hours. CBG:  Recent Labs Lab 06/10/14 1643 06/10/14 1954 06/11/14 0155 06/11/14 0746 06/11/14 1130  GLUCAP 133* 156* 142* 127* 159*    Recent Results (from the past 240 hour(s))  Urine culture     Status: None   Collection Time: 06/07/14 10:45 PM  Result Value Ref Range Status   Specimen Description URINE, CLEAN CATCH  Final   Special Requests NONE  Final   Colony Count   Final    >=100,000 COLONIES/ML Performed at [facility]    Culture   Final    KLEBSIELLA PNEUMONIAE Performed at [facility]    Report Status 06/11/2014 FINAL  Final   Organism ID, Bacteria KLEBSIELLA PNEUMONIAE  Final      Susceptibility   Klebsiella  pneumoniae - MIC*    AMPICILLIN >=32 RESISTANT Resistant     CEFAZOLIN <=4 SENSITIVE Sensitive     CEFTRIAXONE <=1 SENSITIVE Sensitive     CIPROFLOXACIN <=0.25 SENSITIVE Sensitive     GENTAMICIN <=1 SENSITIVE Sensitive     LEVOFLOXACIN <=0.12 SENSITIVE Sensitive     NITROFURANTOIN 128 RESISTANT Resistant     TOBRAMYCIN <=1 SENSITIVE Sensitive     TRIMETH/SULFA <=20 SENSITIVE Sensitive     PIP/TAZO 8 SENSITIVE Sensitive     * KLEBSIELLA PNEUMONIAE     Studies: Dg Chest 1 View  06/10/2014   CLINICAL DATA:  Productive cough for 3 weeks. Syncope and sudden onset of shortness of breath. Initial encounter.  EXAM: CHEST - 1 VIEW  COMPARISON:  Chest radiograph from 06/07/2014  FINDINGS: The lungs are mildly hypoexpanded. New vascular congestion is noted, with increased interstitial markings, concerning for pulmonary edema. No definite pleural effusion or pneumothorax  is seen.  The cardiomediastinal silhouette is mildly enlarged. No acute osseous abnormalities are identified.  IMPRESSION: Lungs mildly hypoexpanded. New vascular congestion and mild cardiomegaly, with increased interstitial markings, concerning for pulmonary edema.   Electronically Signed   By: Garald Balding M.D.   On: 06/10/2014 02:57    Scheduled Meds: . sodium chloride   Intravenous Once  . amLODipine  5 mg Oral Daily  . atenolol  75 mg Oral BID  . atorvastatin  10 mg Oral QHS  . furosemide  80 mg Intravenous 3 times per day  . heparin  5,000 Units Subcutaneous 3 times per day  . insulin aspart  0-15 Units Subcutaneous TID WC  . insulin aspart  0-5 Units Subcutaneous QHS  . iron polysaccharides  150 mg Oral BID  . pantoprazole  40 mg Oral Daily  . sodium bicarbonate  650 mg Oral TID  . sodium chloride  3 mL Intravenous Q12H   Continuous Infusions:   Principal Problem:   ARF (acute renal failure) Active Problems:   Syncope and collapse   Normocytic anemia   Diabetes mellitus without complication   Essential  hypertension   Hyperlipidemia   Edema extremities   Acute renal failure syndrome   Faintness   Contusion of face   Dyspnea   Acute on chronic diastolic heart failure   Metabolic acidosis   Acute respiratory failure   UTI (urinary tract infection)    Time spent: 30 minutes    [facility] Pager [phone] If 7PM-7AM, please contact night-coverage at [email or web address], password [facility] 06/11/2014, 12:03 PM  LOS: 4 days

## 2014-06-11 NOTE — Progress Notes (Signed)
Subjective: Interval History: Patient presently offers no complaints. See her appetite is good and no difficulty breathing.  Objective: Vital signs in last 24 hours: Temp:  [98 F (36.7 C)-98.7 F (37.1 C)] 98.5 F (36.9 C) (01/07 1445) Pulse Rate:  [68-147] 132 (01/07 1445) Resp:  [20-26] 20 (01/07 1445) BP: (100-159)/(51-88) 126/88 mmHg (01/07 1445) SpO2:  [97 %-100 %] 100 % (01/07 1445) Weight:  [80.423 kg (177 lb 4.8 oz)] 80.423 kg (177 lb 4.8 oz) (01/07 0659) Weight change: -3.077 kg (-6 lb 12.6 oz)  Intake/Output from previous day: 01/06 0701 - 01/07 0700 In: 720 [P.O.:720] Out: 3900 [Urine:3900] Intake/Output this shift:    General appearance: alert, cooperative and no distress Resp: clear to auscultation bilaterally Cardio: regular rate and rhythm, S1, S2 normal, no murmur, click, rub or gallop GI: soft, non-tender; bowel sounds normal; no masses,  no organomegaly Extremities: edema Trace edema bilaterally  Lab Results:  Recent Labs  06/10/14 0549 06/11/14 0601  WBC 8.9 5.8  HGB 7.9* 7.6*  HCT 24.2* 23.7*  PLT 265 228   BMET:   Recent Labs  06/10/14 0549 06/11/14 0601  NA 136 137  K 4.6 4.1  CL 109 105  CO2 20 22  GLUCOSE 114* 139*  BUN 68* 79*  CREATININE 6.71* 6.91*  CALCIUM 7.9* 7.7*    Recent Labs  06/08/14 1542  PTH 630*   Iron Studies: No results for input(s): IRON, TIBC, TRANSFERRIN, FERRITIN in the last 72 hours.  Studies/Results: Dg Chest 1 View  06/11/2014   CLINICAL DATA:  Cough and hypertension, history of atrial fibrillation  EXAM: CHEST - 1 VIEW  COMPARISON:  06/10/2014  FINDINGS: Cardiac shadow is stable. Poor inspiratory effort is again noted although the vascular congestion has resolved in the interval from the prior exam. No focal infiltrate is seen. No bony abnormality is noted.  IMPRESSION: Resolution of previously seen congestive failure.   Electronically Signed   By: Inez Catalina M.D.   On: 06/11/2014 15:05   Dg Chest 1  View  06/10/2014   CLINICAL DATA:  Productive cough for 3 weeks. Syncope and sudden onset of shortness of breath. Initial encounter.  EXAM: CHEST - 1 VIEW  COMPARISON:  Chest radiograph from 06/07/2014  FINDINGS: The lungs are mildly hypoexpanded. New vascular congestion is noted, with increased interstitial markings, concerning for pulmonary edema. No definite pleural effusion or pneumothorax is seen.  The cardiomediastinal silhouette is mildly enlarged. No acute osseous abnormalities are identified.  IMPRESSION: Lungs mildly hypoexpanded. New vascular congestion and mild cardiomegaly, with increased interstitial markings, concerning for pulmonary edema.   Electronically Signed   By: Garald Balding M.D.   On: 06/10/2014 02:57   Nm Pulmonary Perf And Vent  06/11/2014   CLINICAL DATA:  Shortness of breath.  EXAM: NUCLEAR MEDICINE VENTILATION - PERFUSION LUNG SCAN  TECHNIQUE: Ventilation images were obtained in multiple projections using inhaled aerosol technetium 99 M DTPA. Perfusion images were obtained in multiple projections after intravenous injection of Tc-53m MAA.  RADIOPHARMACEUTICALS:  40.0 mCi Tc-23m DTPA aerosol and 6.0 mCi Tc-88m MAA  COMPARISON:  Chest radiograph of same day.  FINDINGS: Ventilation: Small subsegmental defect is noted in the posterior segment of the right lower lobe.  Perfusion: Small subsegmental defect is noted and posterior segment of right lower lobe which matches abnormality described on ventilation exam. No other significant defects are noted.  IMPRESSION: Low probability of pulmonary embolus.   Electronically Signed   By: Dionne Ano.D.  On: 06/11/2014 15:07    I have reviewed the patient's current medications.  Assessment/Plan: Problem #1 acute kidney injury superimposed on chronic. The etiology for her renal failure was felt to be secondary to prerenal syndrome/ATN/ACE/ARBS. Patient presently non-oliguric. Patient had 3900 mL of urine output. Her BUN and creatinine  however continued to increase. Presently can't rule out possibility of prerenal syndrome. Problem #2 possible chronic renal failure. No previous blood work and is very difficult to determine the stage. Patient with bilateral increased diffused echogenicity/history of kidney stone/proteinuria. Problem #3 anemia: Possibly comminution of iron deficiency and anemia of chronic disease. Her hemoglobin and hematocrit declining. Iron saturation is 11% and ferritin is 121. Problem #4 diabetes Problem #5 hypertension: Her blood pressure is reasonably controlled Problem #6 metabolic acidosis: Most likely secondary to chronic renal failure Problem #7 tablet for disease: Calcium is range however her phosphorus is high. Patient also with elevated PTH. Problem #8 vitamin D deficiency.  Plan: We'll DC Nu-Iron. We'll start patient on a go-cart several 50,000 international units by mouth once a week Will start patient on Rocaltrol 0.5 g by mouth once a day We'll give patient Feraheme 510 mg IV once We will increase her IV fluid to 125 mL per hour We'll decrease Lasix to 40 mg IV twice a day If her renal function continued to decline patient may benefit from dialysis. And I have discussed with her. Presently patient is asymptomatic and doesn't require dialysis.   LOS: 4 days   Etola Mull S 06/11/2014,3:22 PM

## 2014-06-11 NOTE — Evaluation (Signed)
Physical Therapy Evaluation Patient Details Name: Brenda Lee MRN: [id number] DOB: 15-Dec-1950 Today's Date: 06/11/2014   History of Present Illness  Brenda Lee is a 64 y.o. female with a history of DM2, and HTN who was brought to the [facility] ED due to a syncopal event this evening.   She was in her kitchen getting a glass of water and her husband heard the glass fall to the floor and she reports that she bent over to pick  Up the glass and she blacked out.   When she came to she was disoriented and did not recognize anyone and was taken to the ED.   She had a similar episode 1 week ago and collapsed and fell hitting the left side of her face  And arm.   She developed a bruise around the left eye that is still present.   She denies having any fevers or chills or chest pain or headaches.  She reports having a poor appetite and not drinking much fluid.   She also reports having diarrhea  And stomach upset from her Metformin Rx.  She has also been having low blood sugars lately.     Clinical Impression  Pt is a 64 year old female who presents to PT with dx of ARF.  Pt currently on 3L O2 via [place], and reports she does not wear O2 at home.  Pt was able to transfer to sitting and standing with O2 sats at 94% or above, though after 50 feet of gait sats decreased to 86%.  Pt returned to bed, and recovered to 89% after 1 minute without O2 and to 96% after an additional minute with O2 at 3L.  Pt did have one LOB when first initiating gait, though was able to self correct without assist of PT.  Noted significantly decreased step length, speed and decreased trunk rotation/arm swing during gait; pt reports this is her normal gait pattern.  Per family, pt has been having decreased activity tolerance since 12/2013, and required HHA of husband when walking longer distances up driveway of daughter and required use of handrails to ascend stairs of daughters house.  No dizziness or weakness reported prior to 2 falls  this month, and no previous falls reported; pt reports "i was up then down" in regards to falls.  Recommend continued PT while in the hospital to address strengthening, activity tolerance, and balance for improved functional skills with transition to HHPT at discharge.  Pt may require continued therapy services with OPPT to address activity tolerance for community obstacles/distances after HHPT, and educated pt/family on possible need.  Pt would benefit from use of std cane at this time, as pt had a tendency to hold onto handrail in hallways during gait.      Follow Up Recommendations Home health PT    Equipment Recommendations  Cane (Pt may benefit from std cane, as pt had a tendency to HHA on rails in hallway)       Precautions / Restrictions Precautions Precautions: Fall Restrictions Weight Bearing Restrictions: No      Mobility  Bed Mobility Overal bed mobility: Modified Independent             General bed mobility comments: Increased time to complete task  Transfers Overall transfer level: Needs assistance   Transfers: Sit to/from Stand Sit to Stand: Min guard         General transfer comment: 1 LOB, pt able to self correct, when attempting to take  first step with gait  Ambulation/Gait Ambulation/Gait assistance: Min guard Ambulation Distance (Feet): 50 Feet Assistive device: None (HHA on rail in hallways at times) Gait Pattern/deviations: Decreased stride length   Gait velocity interpretation: at or above normal speed for age/gender General Gait Details: Noted significantly short step length (B), decreased arm swing/trunk rotation, and decreased speed with gait -- pt reports this is her normal gait pattern     Balance Overall balance assessment: History of Falls (2 falls/syncopal episodes this month, no previous falls in past year)                                           Pertinent Vitals/Pain Pain Assessment: No/denies pain    Home  Living Family/patient expects to be discharged to:: Private residence Living Arrangements: Spouse/significant other Available Help at Discharge: Family;Available 24 hours/day Type of Home: House Home Access: Stairs to enter Entrance Stairs-Rails: None Entrance Stairs-Number of Steps: 1 Home Layout: One level Home Equipment: Shower seat - built in Additional Comments: Tub Shower in one bathroom, and Walk in shower with built in seat in other bathroom    Prior Function Level of Independence: Independent                  Extremity/Trunk Assessment               Lower Extremity Assessment: Generalized weakness         Communication   Communication: No difficulties  Cognition Arousal/Alertness: Awake/alert Behavior During Therapy: WFL for tasks assessed/performed Overall Cognitive Status: Within Functional Limits for tasks assessed                       Assessment/Plan    PT Assessment Patient needs continued PT services  PT Diagnosis Abnormality of gait;Generalized weakness   PT Problem List Decreased strength;Decreased activity tolerance;Decreased balance;Decreased mobility  PT Treatment Interventions Balance training;Stair training;Gait training;Functional mobility training;Therapeutic activities;Therapeutic exercise;Manual techniques;Patient/family education;Neuromuscular re-education   PT Goals (Current goals can be found in the Care Plan section) Acute Rehab PT Goals Patient Stated Goal: go home PT Goal Formulation: With patient/family Time For Goal Achievement: 06/25/14 Potential to Achieve Goals: Good    Frequency Min 3X/week    End of Session Equipment Utilized During Treatment: Gait belt Activity Tolerance: Patient limited by fatigue Patient left: in bed;with call bell/phone within reach;with family/visitor present           Time: [id number] PT Time Calculation (min) (ACUTE ONLY): 29 min   Charges:   PT Evaluation $Initial PT  Evaluation Tier I: 1 Procedure     Lonna Cobb, DPT [phone]

## 2014-06-11 NOTE — Consult Note (Signed)
Consulting cardiologist: Dr Carlyle Dolly MD  Clinical Summary Brenda Lee is a 64 y.o.female hx of DM2, HTN Admitted after a syncopal episode. Apparently episode occurred after standing and walking to kitchen, where she bent over to pick up a broke glass. Similar episode 1 week prior.  Has had poor oral intake, as well as diarrhea. She also reports some low blood sugars at times.  On admission noted to have AKI with Cr [id number], metabolic acidosis, and severe anemia. She has had progressive edema and SOB with fluid overload. Today she went into afib with RVR, cardiology is consulted to assist with management.    Admit Cr 6.55, BUN 64, K 4.1, Hgb 7.5, PLT 292 TSH 1.6 ABG 7.09/03/58/16 Trop neg x 1 CXR 06/07/14: No acute process CXR 06/10/13  + vascular congestion, pulm edema Echo Jan 2016 LVEF 60-65%, grade II diastolic dysfunction. Mild MS, mild MR, moderate RV dilatation, PASP 74.  EKG afib RVR  Allergies  Allergen Reactions  . Morphine And Related Nausea And Vomiting    Medications Scheduled Medications: . sodium chloride   Intravenous Once  . sodium chloride   Intravenous Once  . atenolol  75 mg Oral BID  . atorvastatin  10 mg Oral QHS  . cefTRIAXone (ROCEPHIN)  IV  2 g Intravenous Q24H  . diltiazem  30 mg Oral 4 times per day  . furosemide  80 mg Intravenous 3 times per day  . heparin  5,000 Units Subcutaneous 3 times per day  . insulin aspart  0-15 Units Subcutaneous TID WC  . insulin aspart  0-5 Units Subcutaneous QHS  . iron polysaccharides  150 mg Oral BID  . pantoprazole  40 mg Oral Daily  . sodium bicarbonate  650 mg Oral TID  . sodium chloride  3 mL Intravenous Q12H     Infusions:     PRN Medications:  acetaminophen **OR** acetaminophen, clonazePAM, guaiFENesin-dextromethorphan, hydrALAZINE, ipratropium-albuterol, ondansetron **OR** ondansetron (ZOFRAN) IV, oxyCODONE   Past Medical History  Diagnosis Date  . Diabetes mellitus without complication   .  Hypertension   . Anemia   . CKD (chronic kidney disease)   . CHF (congestive heart failure)   . Renal insufficiency     History reviewed. No pertinent past surgical history.  History reviewed. No pertinent family history.  Social History Brenda Lee reports that she has never smoked. She does not have any smokeless tobacco history on file. Brenda Lee reports that she does not drink alcohol.  Review of Systems CONSTITUTIONAL: No weight loss, fever, chills, weakness or fatigue.  HEENT: Eyes: No visual loss, blurred vision, double vision or yellow sclerae. No hearing loss, sneezing, congestion, runny nose or sore throat.  SKIN: No rash or itching.  CARDIOVASCULAR: No chest pain, chest pressure or chest discomfort. No palpitations or edema.  RESPIRATORY: No shortness of breath, cough or sputum.  GASTROINTESTINAL: No anorexia, nausea, vomiting or diarrhea. No abdominal pain or blood.  GENITOURINARY: no polyuria, no dysuria NEUROLOGICAL: No headache, dizziness, syncope, paralysis, ataxia, numbness or tingling in the extremities. No change in bowel or bladder control.  MUSCULOSKELETAL: No muscle, back pain, joint pain or stiffness.  HEMATOLOGIC: No anemia, bleeding or bruising.  LYMPHATICS: No enlarged nodes. No history of splenectomy.  PSYCHIATRIC: No history of depression or anxiety.      Physical Examination Blood pressure 118/79, pulse 128, temperature 98.5 F (36.9 C), temperature source Oral, resp. rate 20, height 5\' 7"  (1.702 m), weight 177 lb 4.8 oz (80.423 kg),  SpO2 100 %.  Intake/Output Summary (Last 24 hours) at 06/11/14 1424 Last data filed at 06/11/14 0455  Gross per 24 hour  Intake    240 ml  Output   2700 ml  Net  -2460 ml    HEENT: sclera clear  Cardiovascular: irreg, no JVD but + hepatogular reflux  Respiratory: coarse bilatearlly  GI: soft, NT, ND  MSK: no LE edema  Neuro: no focal deficits  Psych: appropriate affect   Lab Results  Basic  Metabolic Panel:  Recent Labs Lab 06/07/14 2120 06/08/14 0546 06/08/14 1542 06/09/14 0554 06/10/14 0549 06/11/14 0601  NA 137 136  --  136 136 137  K 4.1 4.7  --  4.4 4.6 4.1  CL 110 110  --  111 109 105  CO2 18* 18*  --  17* 20 22  GLUCOSE 91 197*  --  161* 114* 139*  BUN 64* 66*  --  63* 68* 79*  CREATININE 6.55* 6.55*  --  6.56* 6.71* 6.91*  CALCIUM 7.9* 7.8* 7.9* 7.9* 7.9* 7.7*  PHOS  --   --   --  5.7*  --   --     Liver Function Tests: No results for input(s): AST, ALT, ALKPHOS, BILITOT, PROT, ALBUMIN in the last 168 hours.  CBC:  Recent Labs Lab 06/07/14 2120 06/08/14 0546 06/09/14 0554 06/09/14 1856 06/10/14 0549 06/11/14 0601  WBC 5.6 4.9 6.3 8.7 8.9 5.8  NEUTROABS 3.7  --   --  7.1  --   --   HGB 7.5* 7.0* 6.8* 8.0* 7.9* 7.6*  HCT 23.6* 21.8* 21.1* 24.5* 24.2* 23.7*  MCV 87.4 87.6 86.5 87.5 86.4 85.9  PLT 292 255 256 261 265 228    Cardiac Enzymes:  Recent Labs Lab 06/07/14 2120  TROPONINI <0.03    BNP: Invalid input(s): POCBNP     Impression/Recommendations 1. Acute diastolic heart failure - echo shows grade II diastolic dysfunction, she also has some mild mitral stenosis and pulmonary HTN, though RV function is normal.  - negative 5.4 liters since admission. Currently on lasix 80mg  IV every 8 hrs, with good uop, nearly 4 liters yesterday - continue IV diuretics, per renal notes considering dialysis if fluid status does not improve with diuretics alone.   2. AKI - followed by renal, Cr trending up.  - stop atenolol in setting of renal failure, change to metoprolol  3. Pulmonary HTN - elevated PASP on echo at 74. Likely multifactorial including grade II diastolic dysfunction with active fluid overload, mild to moderate gradient across MV consistent with mild to mod mitral stenosis. Unclear if pulmonary component - once euvolemic would repeat echo to reevaluate PASP once left sided filling pressures decreased, if persistently elevated may  warrant further investigation including RHC.  - ANA negative  4. Anemia - per primary team  5. Afib - new diagnosis this admit - started on oral dilt, had been on oral atenolol - change to metoprolol in setting of severe renal dysfunction, continue oral dilt with titration as needed for rate control. Will write for metoprolol 5mg  IV now.  - chronic anemia, has been transfused this admit, awaiting FOBT results. Will not start anticoag at this time. Also 2 episodes of syncope over the last week.  - TSH 1.64, severe LAE on echo. K 4.1, will add on Mg lab.    Carlyle Dolly, M.D.

## 2014-06-12 ENCOUNTER — Encounter (HOSPITAL_COMMUNITY): Payer: Self-pay | Admitting: Internal Medicine

## 2014-06-12 DIAGNOSIS — I480 Paroxysmal atrial fibrillation: Secondary | ICD-10-CM

## 2014-06-12 DIAGNOSIS — I272 Pulmonary hypertension, unspecified: Secondary | ICD-10-CM

## 2014-06-12 DIAGNOSIS — E876 Hypokalemia: Secondary | ICD-10-CM | POA: Diagnosis not present

## 2014-06-12 HISTORY — DX: Pulmonary hypertension, unspecified: I27.20

## 2014-06-12 LAB — CBC
HCT: 26.7 % — ABNORMAL LOW (ref 36.0–46.0)
Hemoglobin: 8.9 g/dL — ABNORMAL LOW (ref 12.0–15.0)
MCH: 28.4 pg (ref 26.0–34.0)
MCHC: 33.3 g/dL (ref 30.0–36.0)
MCV: 85.3 fL (ref 78.0–100.0)
Platelets: 222 10*3/uL (ref 150–400)
RBC: 3.13 MIL/uL — ABNORMAL LOW (ref 3.87–5.11)
RDW: 13.8 % (ref 11.5–15.5)
WBC: 5.9 10*3/uL (ref 4.0–10.5)

## 2014-06-12 LAB — BASIC METABOLIC PANEL
Anion gap: 13 (ref 5–15)
BUN: 82 mg/dL — AB (ref 6–23)
CALCIUM: 7.6 mg/dL — AB (ref 8.4–10.5)
CHLORIDE: 99 meq/L (ref 96–112)
CO2: 23 mmol/L (ref 19–32)
Creatinine, Ser: 6.58 mg/dL — ABNORMAL HIGH (ref 0.50–1.10)
GFR calc Af Amer: 7 mL/min — ABNORMAL LOW (ref >90)
GFR calc non Af Amer: 6 mL/min — ABNORMAL LOW (ref >90)
GLUCOSE: 128 mg/dL — AB (ref 70–99)
Potassium: 3.3 mmol/L — ABNORMAL LOW (ref 3.5–5.1)
Sodium: 135 mmol/L (ref 135–145)

## 2014-06-12 LAB — GLUCOSE, CAPILLARY
GLUCOSE-CAPILLARY: 130 mg/dL — AB (ref 70–99)
GLUCOSE-CAPILLARY: 159 mg/dL — AB (ref 70–99)
Glucose-Capillary: 166 mg/dL — ABNORMAL HIGH (ref 70–99)
Glucose-Capillary: 159 mg/dL — ABNORMAL HIGH (ref 70–99)

## 2014-06-12 LAB — PHOSPHORUS: Phosphorus: 6.3 mg/dL — ABNORMAL HIGH (ref 2.3–4.6)

## 2014-06-12 LAB — CREATININE, URINE, RANDOM: Creatinine, Urine: 26.78 mg/dL

## 2014-06-12 LAB — MAGNESIUM: MAGNESIUM: 1.8 mg/dL (ref 1.5–2.5)

## 2014-06-12 LAB — OCCULT BLOOD X 1 CARD TO LAB, STOOL: Fecal Occult Bld: NEGATIVE

## 2014-06-12 MED ORDER — SODIUM CHLORIDE 0.9 % IV SOLN: 510.0000 mg | Freq: Once | INTRAVENOUS | Status: DC

## 2014-06-12 MED ORDER — POTASSIUM CHLORIDE IN NACL 20-0.45 MEQ/L-% IV SOLN
INTRAVENOUS | Status: DC
  Administered 2014-06-12 – 2014-06-13 (×2): via INTRAVENOUS
  Filled 2014-06-12 (×10): qty 1000

## 2014-06-12 MED ORDER — TRAZODONE HCL 50 MG PO TABS
50.0000 mg | ORAL_TABLET | Freq: Every evening | ORAL | Status: DC | PRN
  Administered 2014-06-12 – 2014-06-13 (×2): 50 mg via ORAL
  Filled 2014-06-12 (×2): qty 1

## 2014-06-12 NOTE — Progress Notes (Signed)
Physical Therapy Treatment Patient Details Name: Brenda Lee MRN: [id number] DOB: 03-20-1951 Today's Date: 06/12/2014    History of Present Illness Brenda Lee is a 64 y.o. female with a history of DM2, and HTN who was brought to the [facility] ED due to a syncopal event this evening.   She was in her kitchen getting a glass of water and her husband heard the glass fall to the floor and she reports that she bent over to pick  Up the glass and she blacked out.   When she came to she was disoriented and did not recognize anyone and was taken to the ED.   She had a similar episode 1 week ago and collapsed and fell hitting the left side of her face  And arm.   She developed a bruise around the left eye that is still present.   She denies having any fevers or chills or chest pain or headaches.  She reports having a poor appetite and not drinking much fluid.   She also reports having diarrhea  And stomach upset from her Metformin Rx.  She has also been having low blood sugars lately.       PT Comments    Pt pleasant and willing to participate in skilled PT session. Pt able to complete bed with Mod(I), sit to stand transfers with S, and was able to complete gait x123ft with min guard and no AD. However pt did demonstrate occasional posterior LOB during gait and also displayed reduced trunk/core rotation during gait, putting her at increased risk for fall. Pt did participate in standing activities including side stepping, standing marches, toe raises, and mini-squats at hallway railing but without HHA. Some difficulty noted in maintaining optimal positioning of pelvis for sidestepping, cues required for appropriate marching form; posterior LOB during backwards stepping as well as mild fatigue at end of session. Pt left upright in chair with family present and all needs met.   Follow Up Recommendations  Home health PT     Equipment Recommendations  Other (comment) (pt does not want to use cane for  mobility)    Recommendations for Other Services       Precautions / Restrictions Precautions Precautions: Fall Restrictions Weight Bearing Restrictions: No    Mobility  Bed Mobility Overal bed mobility: Modified Independent             General bed mobility comments: WNL  Transfers Overall transfer level: Needs assistance Equipment used: None Transfers: Sit to/from Stand Sit to Stand: Supervision            Ambulation/Gait Ambulation/Gait assistance: Min guard Ambulation Distance (Feet): 110 Feet Assistive device: None Gait Pattern/deviations: WFL(Within Functional Limits)   Gait velocity interpretation: at or above normal speed for age/gender General Gait Details: occasional posterior LOB, however pt demonstrated abilty to self-correct with Min guard from PT   Stairs            Wheelchair Mobility    Modified Rankin (Stroke Patients Only)       Balance Overall balance assessment: Needs assistance;History of Falls                           High level balance activites: Side stepping;Backward walking High Level Balance Comments: pt able to complete sidestepping with Min guard however did display difficulty in maintaining appropriate alignment of hips during task; posterior LOB noted during backwards stepping    Cognition Arousal/Alertness: Awake/alert  Behavior During Therapy: WFL for tasks assessed/performed Overall Cognitive Status: Within Functional Limits for tasks assessed                      Exercises Other Exercises Other Exercises: side stepping, backward stepping, toe raises, marches, min-squats; pt did require Min cues for proper technique throughout. No HHA.    General Comments        Pertinent Vitals/Pain Pain Assessment: No/denies pain    Home Living                      Prior Function            PT Goals (current goals can now be found in the care plan section) Progress towards PT goals:  Progressing toward goals    Frequency  Min 3X/week    PT Plan Current plan remains appropriate    Co-evaluation             End of Session Equipment Utilized During Treatment: Gait belt Activity Tolerance: Patient tolerated treatment well Patient left: in chair;with call bell/phone within reach;with family/visitor present     Time: 1135-1201 PT Time Calculation (min) (ACUTE ONLY): 26 min  Charges:  $Gait Training: 8-22 mins $Therapeutic Exercise: 8-22 mins                    G Codes:      Hunt Oris July 12, 2014, 12:16 PM

## 2014-06-12 NOTE — Progress Notes (Signed)
[facility] PROGRESS NOTE  Brenda Lee [id number] DOB: 11-Apr-1951 DOA: 06/07/2014 PCP: Glenda Chroman., MD  Assessment/Plan: Acute respiratory failure with hypoxia: likely related to acute on chronic diastolic heart failure secondary to IV fluids. Resolved this am.  Normal effort and sats 99% on 1L. Diuretics per nephrology. weaning oxygen as able.    Acute on Chronic diastolic heart failure. Much improved.  Echo with EF 60% and grade II diastolic dysfunction and mitral stenosis.  Weight 78.9kg down from 80.4kg yesterday. Continues with LE edema.  Lasix per nephrology. Fluids restarted per nephrology yesterday and creatinine improved.  Atenolol changed to metoprolol per cardiology due to acute renal failure. Evaluated by cardiology who recommends caution with IV fluids. Await FeUrea. Lasix per nephrology  Afib with rvr: in NSR this am. Rate  56-73. Evaluated by cardiology who started metoprolol and diltiazem. No anticoagulation recommended at this time due to anemia.   ARF (acute renal failure): likely acute on chronic and likely multifactorial i.e. ATN/ACE/ARB. Renal US reveals diffusely increased renal parenchymal echogenicity, consistent with medical renal disease. Creatinine trending down today. Urine output 4375 yesterday. Evaluated by nephrology who opines CKD at least stage III.  Follow nephrology recommendations. Urine output good.   Active Problems:  UTI: urine culture with klebsiella pneumoniae. Rocephin day #2. She remains afebrile and non-toxic appearing.   Hypokalemia: likely related to diuretics in setting of acute renal failure. Will check magnesium.   Metabolic Acidosis: related to #1. Resolved. Contnue Sodium bicarb po per renal. Will monitor.    Syncope and collapse: likely related to dehydration and orthostasis.. CT head negative.     Dyspnea: much  improved this am. See #1. Chest xray on admission without infiltrate or edema. Hg is 7.6. Oxygen  saturation level >90% on 1L. No leukocytosis, afebrile and non-toxic. Wean oxygen supplementation as able. .    Normocytic anemia: likely related to chronic disease. No s/sx of active bleed. Hg 8.9 s/p 2 unit PRBC's most recently yesterday. Await FOBT. Baseline unknown. Likely related to chronic disease. PO iron. Aemia panel with low iron.will likely need OP GI consult and possible colonoscopy   Diabetes mellitus without complication: PCP report A1c 5.4 last month. Continue to oral agents. Use SSI. CBG range 130-166. Reportedly Episode hypoglycemia prior to admission. Anticipating adjusting and/or discontinuing oral meds at discharge.    Essential hypertension: Only fair control. SBP range 149-157. holding home ACE and HCTZ and triamterene as well as ARB. Atenolol changed to metoprolol per cardiology and diltiazem started for afib. See #3    Hyperlipidemia: lipid panel HDL 29 and LDL chol 118 otherwise unremarkable. Continue statin    Code Status: full Family Communication: husband at bedside Disposition Plan: home when ready   Consultants:  Nephrology  cardiology  Procedures:  2 d-echo The estimated ejection fraction was in the range of 60% to 65%. Wall motion was normal; there were no regional wall motion abnormalities. Features are consistent with a pseudonormal left ventricular filling pattern, with concomitant abnormal relaxation and increased filling pressure (grade 2 diastolic dysfunction  Antibiotics:  Rocephin 06/11/14>>>  HPI/Subjective: Feeling much better today. Having difficulty sleeping at night. Denies pain/discomfort.   Objective: Filed Vitals:   06/12/14 1055  BP: 157/49  Pulse: 73  Temp:   Resp:     Intake/Output Summary (Last 24 hours) at 06/12/14 1119 Last data filed at 06/12/14 0900  Gross per 24 hour  Intake    240 ml  Output   4375 ml  Net  -  4135 ml   Filed Weights   06/10/14 0559 06/11/14 0659 06/12/14 0832  Weight:  83.5 kg (184 lb 1.4 oz) 80.423 kg (177 lb 4.8 oz) 78.926 kg (174 lb)    Exam:   General:  Well nourished appears comfortable  Cardiovascular: RRR No m/g/r trace LE edema  Respiratory: normal effort BS with only very fine crackle in bases no wheeze no rhonchi  Abdomen: obese soft +BS non-distended no guarding no rebound  Musculoskeletal: joints without swelling/erythema   Data Reviewed: Basic Metabolic Panel:  Recent Labs Lab 06/08/14 0546 06/08/14 1542 06/09/14 0554 06/10/14 0549 06/11/14 0601 06/12/14 0613  NA 136  --  136 136 137 135  K 4.7  --  4.4 4.6 4.1 3.3*  CL 110  --  111 109 105 99  CO2 18*  --  17* 20 22 23   GLUCOSE 197*  --  161* 114* 139* 128*  BUN 66*  --  63* 68* 79* 82*  CREATININE 6.55*  --  6.56* 6.71* 6.91* 6.58*  CALCIUM 7.8* 7.9* 7.9* 7.9* 7.7* 7.6*  MG  --   --   --   --  2.0  --   PHOS  --   --  5.7*  --   --  6.3*   Liver Function Tests: No results for input(s): AST, ALT, ALKPHOS, BILITOT, PROT, ALBUMIN in the last 168 hours. No results for input(s): LIPASE, AMYLASE in the last 168 hours. No results for input(s): AMMONIA in the last 168 hours. CBC:  Recent Labs Lab 06/07/14 2120  06/09/14 1856 06/10/14 0549 06/11/14 0601 06/11/14 1920 06/12/14 0613  WBC 5.6  < > 8.7 8.9 5.8 5.6 5.9  NEUTROABS 3.7  --  7.1  --   --   --   --   HGB 7.5*  < > 8.0* 7.9* 7.6* 9.2* 8.9*  HCT 23.6*  < > 24.5* 24.2* 23.7* 27.2* 26.7*  MCV 87.4  < > 87.5 86.4 85.9 86.3 85.3  PLT 292  < > 261 265 228 238 222  < > = values in this interval not displayed. Cardiac Enzymes:  Recent Labs Lab 06/07/14 2120  TROPONINI <0.03   BNP (last 3 results) No results for input(s): PROBNP in the last 8760 hours. CBG:  Recent Labs Lab 06/11/14 0746 06/11/14 1130 06/11/14 1702 06/11/14 2159 06/12/14 0810  GLUCAP 127* 159* 150* 202* 130*    Recent Results (from the past 240 hour(s))  Urine culture     Status: None   Collection Time: 06/07/14 10:45 PM  Result  Value Ref Range Status   Specimen Description URINE, CLEAN CATCH  Final   Special Requests NONE  Final   Colony Count   Final    >=100,000 COLONIES/ML Performed at [facility]    Culture   Final    KLEBSIELLA PNEUMONIAE Performed at [facility]    Report Status 06/11/2014 FINAL  Final   Organism ID, Bacteria KLEBSIELLA PNEUMONIAE  Final      Susceptibility   Klebsiella pneumoniae - MIC*    AMPICILLIN >=32 RESISTANT Resistant     CEFAZOLIN <=4 SENSITIVE Sensitive     CEFTRIAXONE <=1 SENSITIVE Sensitive     CIPROFLOXACIN <=0.25 SENSITIVE Sensitive     GENTAMICIN <=1 SENSITIVE Sensitive     LEVOFLOXACIN <=0.12 SENSITIVE Sensitive     NITROFURANTOIN 128 RESISTANT Resistant     TOBRAMYCIN <=1 SENSITIVE Sensitive     TRIMETH/SULFA <=20 SENSITIVE Sensitive     PIP/TAZO  8 SENSITIVE Sensitive     * KLEBSIELLA PNEUMONIAE     Studies: Dg Chest 1 View  06/11/2014   CLINICAL DATA:  Cough and hypertension, history of atrial fibrillation  EXAM: CHEST - 1 VIEW  COMPARISON:  06/10/2014  FINDINGS: Cardiac shadow is stable. Poor inspiratory effort is again noted although the vascular congestion has resolved in the interval from the prior exam. No focal infiltrate is seen. No bony abnormality is noted.  IMPRESSION: Resolution of previously seen congestive failure.   Electronically Signed   By: Inez Catalina M.D.   On: 06/11/2014 15:05   Nm Pulmonary Perf And Vent  06/11/2014   CLINICAL DATA:  Shortness of breath.  EXAM: NUCLEAR MEDICINE VENTILATION - PERFUSION LUNG SCAN  TECHNIQUE: Ventilation images were obtained in multiple projections using inhaled aerosol technetium 99 M DTPA. Perfusion images were obtained in multiple projections after intravenous injection of Tc-50m MAA.  RADIOPHARMACEUTICALS:  40.0 mCi Tc-66m DTPA aerosol and 6.0 mCi Tc-58m MAA  COMPARISON:  Chest radiograph of same day.  FINDINGS: Ventilation: Small subsegmental defect is noted in the posterior segment of the  right lower lobe.  Perfusion: Small subsegmental defect is noted and posterior segment of right lower lobe which matches abnormality described on ventilation exam. No other significant defects are noted.  IMPRESSION: Low probability of pulmonary embolus.   Electronically Signed   By: Sabino Dick M.D.   On: 06/11/2014 15:07    Scheduled Meds: . sodium chloride   Intravenous Once  . sodium chloride   Intravenous Once  . atorvastatin  10 mg Oral QHS  . calcitRIOL  0.5 mcg Oral Daily  . cefTRIAXone (ROCEPHIN)  IV  2 g Intravenous Q24H  . diltiazem  30 mg Oral 4 times per day  . furosemide  40 mg Intravenous BID  . heparin  5,000 Units Subcutaneous 3 times per day  . insulin aspart  0-15 Units Subcutaneous TID WC  . insulin aspart  0-5 Units Subcutaneous QHS  . metoprolol tartrate  50 mg Oral BID  . pantoprazole  40 mg Oral Daily  . sodium bicarbonate  650 mg Oral TID  . sodium chloride  3 mL Intravenous Q12H  . Vitamin D (Ergocalciferol)  50,000 Units Oral Q7 days   Continuous Infusions: . sodium chloride 125 mL/hr at 06/12/14 0910    Principal Problem:   ARF (acute renal failure) Active Problems:   Syncope and collapse   Normocytic anemia   Diabetes mellitus without complication   Essential hypertension   Hyperlipidemia   Edema extremities   Acute renal failure syndrome   Faintness   Contusion of face   Dyspnea   Acute on chronic diastolic heart failure   Metabolic acidosis   Acute respiratory failure   UTI (urinary tract infection)   Atrial fibrillation with RVR   Acute diastolic heart failure   A-fib    Time spent: 35 minutes    [facility] Pager [phone]. If 7PM-7AM, please contact night-coverage at [email or web address], password [facility] 06/12/2014, 11:19 AM  LOS: 5 days

## 2014-06-12 NOTE — Progress Notes (Signed)
Patient ID: Brenda Lee, female   DOB: 03/16/1951, 64 y.o.   MRN: [id number]     Subjective:    SOB improved, no long requiring oxygen  Objective:   Temp:  [98.1 F (36.7 C)-98.8 F (37.1 C)] 98.4 F (36.9 C) (01/08 0523) Pulse Rate:  [56-147] 56 (01/08 0523) Resp:  [20] 20 (01/08 0523) BP: (100-157)/(53-88) 157/57 mmHg (01/08 0523) SpO2:  [86 %-100 %] 100 % (01/08 0523) Weight:  [174 lb (78.926 kg)] 174 lb (78.926 kg) (01/08 0832) Last BM Date: 06/10/14  Filed Weights   06/10/14 0559 06/11/14 0659 06/12/14 0832  Weight: 184 lb 1.4 oz (83.5 kg) 177 lb 4.8 oz (80.423 kg) 174 lb (78.926 kg)    Intake/Output Summary (Last 24 hours) at 06/12/14 0913 Last data filed at 06/12/14 0523  Gross per 24 hour  Intake      0 ml  Output   4375 ml  Net  -4375 ml    Telemetry: NSR  Exam:  General: NAD  Resp: CTAB  Cardiac: RRR, no m/r/g, no JVD  [id number] soft, NT, ND  MSK: no LE edema  Neuro: no focal deficits   Lab Results:  Basic Metabolic Panel:  Recent Labs Lab 06/10/14 0549 06/11/14 0601 06/12/14 0613  NA 136 137 135  K 4.6 4.1 3.3*  CL 109 105 99  CO2 20 22 23   GLUCOSE 114* 139* 128*  BUN 68* 79* 82*  CREATININE 6.71* 6.91* 6.58*  CALCIUM 7.9* 7.7* 7.6*  MG  --  2.0  --     Liver Function Tests: No results for input(s): AST, ALT, ALKPHOS, BILITOT, PROT, ALBUMIN in the last 168 hours.  CBC:  Recent Labs Lab 06/11/14 0601 06/11/14 1920 06/12/14 0613  WBC 5.8 5.6 5.9  HGB 7.6* 9.2* 8.9*  HCT 23.7* 27.2* 26.7*  MCV 85.9 86.3 85.3  PLT 228 238 222    Cardiac Enzymes:  Recent Labs Lab 06/07/14 2120  TROPONINI <0.03    BNP: No results for input(s): PROBNP in the last 8760 hours.  Coagulation: No results for input(s): INR in the last 168 hours.  ECG:   Medications:   Scheduled Medications: . sodium chloride   Intravenous Once  . sodium chloride   Intravenous Once  . atorvastatin  10 mg Oral QHS  . calcitRIOL  0.5 mcg Oral  Daily  . cefTRIAXone (ROCEPHIN)  IV  2 g Intravenous Q24H  . diltiazem  30 mg Oral 4 times per day  . furosemide  40 mg Intravenous BID  . heparin  5,000 Units Subcutaneous 3 times per day  . insulin aspart  0-15 Units Subcutaneous TID WC  . insulin aspart  0-5 Units Subcutaneous QHS  . metoprolol tartrate  50 mg Oral BID  . pantoprazole  40 mg Oral Daily  . sodium bicarbonate  650 mg Oral TID  . sodium chloride  3 mL Intravenous Q12H  . Vitamin D (Ergocalciferol)  50,000 Units Oral Q7 days     Infusions: . sodium chloride 125 mL/hr at 06/12/14 0910     PRN Medications:  acetaminophen **OR** acetaminophen, clonazePAM, guaiFENesin-dextromethorphan, hydrALAZINE, ipratropium-albuterol, metoprolol, ondansetron **OR** ondansetron (ZOFRAN) IV, oxyCODONE     Assessment/Plan   1. Acute diastolic heart failure - echo shows grade II diastolic dysfunction, she also has some mild mitral stenosis and pulmonary HTN, though RV function is normal.  - I/Os inaccurate, no input documented for yesterday. She is on lasix 40mg  IV bid. CXR with improved edema, SOB  improved no longer requiring O2. She is likely euvolemic.  - IVF started by renal this AM. If indicated from kidney standpoint will require signfiicant caution not to tilt her back into diastolic HF. Will send FeUrea this AM to further evaluate if she may be prerenal - diuretics and IVF per renal  2. AKI - followed by renal  3. Pulmonary HTN - elevated PASP on echo at 74. Likely multifactorial including grade II diastolic dysfunction with active fluid overload, mild to moderate gradient across MV consistent with mild to mod mitral stenosis. Unclear if pulmonary component - once euvolemic would repeat echo to reevaluate PASP once left sided filling pressures decreased, if persistently elevated may warrant further investigation including RHC.  - ANA negative  4. Anemia - per primary team  5. Afib - new diagnosis this admit. Back in  NSR today.  - on oral dilt and metoprolol - chronic anemia, has been transfused this admit, most recently yesterday. Will not start anticoag at this time. May consider if blood counts remain stable post transfusion and etiology of anemia is better understood.  Also 2 episodes of syncope over the last week.  - TSH 1.64, severe LAE on echo. K 4.1, Mg 2. VQ scan low probability for PE.  - follow on telemetry. If stable heart rates and bp likely change to long acting dilt 120mg  tomorrow.         Carlyle Dolly, M.D.

## 2014-06-12 NOTE — Progress Notes (Addendum)
Subjective: Interval History: Patient states that she's feeling much better. She denies any difficulty in breathing and her appetite is good.  Objective: Vital signs in last 24 hours: Temp:  [98.1 F (36.7 C)-98.8 F (37.1 C)] 98.4 F (36.9 C) (01/08 0523) Pulse Rate:  [56-139] 73 (01/08 1055) Resp:  [20] 20 (01/08 0523) BP: (111-157)/(49-88) 157/49 mmHg (01/08 1055) SpO2:  [86 %-100 %] 100 % (01/08 0523) Weight:  [78.926 kg (174 lb)] 78.926 kg (174 lb) (01/08 0832) Weight change:   Intake/Output from previous day: 01/07 0701 - 01/08 0700 In: -  Out: 4375 [Urine:4375] Intake/Output this shift: Total I/O In: 240 [P.O.:240] Out: -   General appearance: alert, cooperative and no distress Resp: clear to auscultation bilaterally Cardio: regular rate and rhythm, S1, S2 normal, no murmur, click, rub or gallop GI: soft, non-tender; bowel sounds normal; no masses,  no organomegaly Extremities: edema Trace edema bilaterally  Lab Results:  Recent Labs  06/11/14 1920 06/12/14 0613  WBC 5.6 5.9  HGB 9.2* 8.9*  HCT 27.2* 26.7*  PLT 238 222   BMET:   Recent Labs  06/11/14 0601 06/12/14 0613  NA 137 135  K 4.1 3.3*  CL 105 99  CO2 22 23  GLUCOSE 139* 128*  BUN 79* 82*  CREATININE 6.91* 6.58*  CALCIUM 7.7* 7.6*   No results for input(s): PTH in the last 72 hours. Iron Studies: No results for input(s): IRON, TIBC, TRANSFERRIN, FERRITIN in the last 72 hours.  Studies/Results: Dg Chest 1 View  06/11/2014   CLINICAL DATA:  Cough and hypertension, history of atrial fibrillation  EXAM: CHEST - 1 VIEW  COMPARISON:  06/10/2014  FINDINGS: Cardiac shadow is stable. Poor inspiratory effort is again noted although the vascular congestion has resolved in the interval from the prior exam. No focal infiltrate is seen. No bony abnormality is noted.  IMPRESSION: Resolution of previously seen congestive failure.   Electronically Signed   By: Inez Catalina M.D.   On: 06/11/2014 15:05   Nm  Pulmonary Perf And Vent  06/11/2014   CLINICAL DATA:  Shortness of breath.  EXAM: NUCLEAR MEDICINE VENTILATION - PERFUSION LUNG SCAN  TECHNIQUE: Ventilation images were obtained in multiple projections using inhaled aerosol technetium 99 M DTPA. Perfusion images were obtained in multiple projections after intravenous injection of Tc-44m MAA.  RADIOPHARMACEUTICALS:  40.0 mCi Tc-53m DTPA aerosol and 6.0 mCi Tc-31m MAA  COMPARISON:  Chest radiograph of same day.  FINDINGS: Ventilation: Small subsegmental defect is noted in the posterior segment of the right lower lobe.  Perfusion: Small subsegmental defect is noted and posterior segment of right lower lobe which matches abnormality described on ventilation exam. No other significant defects are noted.  IMPRESSION: Low probability of pulmonary embolus.   Electronically Signed   By: Sabino Dick M.D.   On: 06/11/2014 15:07    I have reviewed the patient's current medications.  Assessment/Plan: Problem #1 acute kidney injury superimposed on chronic. The etiology for her renal failure was felt to be secondary to prerenal syndrome/ATN/ACE/ARBS. Patient presently non-oliguric. Patient had 4300 mL of urine output. Her BUN and creatinine presently seems to be improving. Problem #2 possible chronic renal failure. No previous blood work and is very difficult to determine the stage. Patient with bilateral increased diffused echogenicity/history of kidney stone/proteinuria. Problem #3 anemia: Possibly comminution of iron deficiency and anemia of chronic disease. Her hemoglobin is low. Patient has received IV iron 1 dose yesterday. Problem #4 diabetes Problem #5 hypertension: Her  blood pressure is reasonably controlled Problem #6 metabolic acidosis: Most likely secondary to chronic renal failure Problem #7 metabolic bone disease f disease: Calcium is range however her phosphorus is high. Patient also with elevated PTH. Problem #8 vitamin D deficiency: Patient on  vitamin D supplement. Problem #9 hypokalemia most likely secondary to Lasix. Plan: we'll DC Lasix We'll change her IV fluid to half normal saline with 20 mEq of KCl at 135 mL per hour   LOS: 5 days   Dionta Larke S 06/12/2014,12:19 PM

## 2014-06-13 LAB — TYPE AND SCREEN
ABO/RH(D): O POS
Antibody Screen: NEGATIVE
UNIT DIVISION: 0
UNIT DIVISION: 0
Unit division: 0

## 2014-06-13 LAB — CBC
HCT: 27.7 % — ABNORMAL LOW (ref 36.0–46.0)
Hemoglobin: 9.4 g/dL — ABNORMAL LOW (ref 12.0–15.0)
MCH: 28.7 pg (ref 26.0–34.0)
MCHC: 33.9 g/dL (ref 30.0–36.0)
MCV: 84.7 fL (ref 78.0–100.0)
Platelets: 236 10*3/uL (ref 150–400)
RBC: 3.27 MIL/uL — ABNORMAL LOW (ref 3.87–5.11)
RDW: 13.6 % (ref 11.5–15.5)
WBC: 5.5 10*3/uL (ref 4.0–10.5)

## 2014-06-13 LAB — GLUCOSE, CAPILLARY
GLUCOSE-CAPILLARY: 134 mg/dL — AB (ref 70–99)
Glucose-Capillary: 194 mg/dL — ABNORMAL HIGH (ref 70–99)
Glucose-Capillary: 225 mg/dL — ABNORMAL HIGH (ref 70–99)
Glucose-Capillary: 192 mg/dL — ABNORMAL HIGH (ref 70–99)

## 2014-06-13 LAB — BASIC METABOLIC PANEL
ANION GAP: 11 (ref 5–15)
BUN: 72 mg/dL — ABNORMAL HIGH (ref 6–23)
CO2: 23 mmol/L (ref 19–32)
CREATININE: 5.87 mg/dL — AB (ref 0.50–1.10)
Calcium: 8 mg/dL — ABNORMAL LOW (ref 8.4–10.5)
Chloride: 101 mEq/L (ref 96–112)
GFR calc Af Amer: 8 mL/min — ABNORMAL LOW (ref >90)
GFR calc non Af Amer: 7 mL/min — ABNORMAL LOW (ref >90)
Glucose, Bld: 133 mg/dL — ABNORMAL HIGH (ref 70–99)
Potassium: 3.5 mmol/L (ref 3.5–5.1)
Sodium: 135 mmol/L (ref 135–145)

## 2014-06-13 LAB — UIFE/LIGHT CHAINS/TP QN, 24-HR UR
TIME-UPE24: 24 h
Time: 24 hours
VOLUME, URINE-UPE24: 24 mL

## 2014-06-13 LAB — PHOSPHORUS: Phosphorus: 5.2 mg/dL — ABNORMAL HIGH (ref 2.3–4.6)

## 2014-06-13 NOTE — Progress Notes (Signed)
[facility] PROGRESS NOTE  Brenda Lee [id number] DOB: 10-29-1950 DOA: 06/07/2014 PCP: Glenda Chroman., MD    Code Status: Full code Family Communication: Discussed with husband and son on 06/12/14. Disposition Plan: Discharged home when clinically appropriate.   Consultants:  Nephrology  Cardiology  Procedures: VQ scan low probability PE  2-D echocardiogram:- Left ventricle: The cavity size was normal. Wall thickness was normal. Systolic function was normal. The estimated ejection fraction was in the range of 60% to 65%. Wall motion was normal; there were no regional wall motion abnormalities. Features are consistent with a pseudonormal left ventricular filling pattern, with concomitant abnormal relaxation and increased filling pressure (grade 2 diastolic dysfunction). - Aortic valve: Moderately calcified annulus. Trileaflet; moderately thickened leaflets. - Mitral valve: Mildly to moderately calcified annulus. Mildly thickened leaflets . The findings are consistent with mild to moderate stenosis. There was mild regurgitation. Mean gradient (D): 5 mm Hg. - Left atrium: The atrium was severely dilated. - Right ventricle: The cavity size was moderately dilated. It shares the apex with the LV. Systolic function was normal. RV TAPSe is 2.0 cm. - Right atrium: The atrium was moderately dilated. - Pulmonary arteries: Systolic pressure was severely increased. PA peak pressure: 74 mm Hg (S). - Technically difficult study.   Antibiotics:  Rocephin 06/11/14>>  HPI/Subjective: The patient is laying in bed, asleep, but easily arousable. She has no new complaints.  Objective: Filed Vitals:   06/13/14 1451  BP: 163/70  Pulse: 75  Temp: 98.8 F (37.1 C)  Resp: 18    Intake/Output Summary (Last 24 hours) at 06/13/14 1548 Last data filed at 06/13/14 1453  Gross per 24 hour  Intake    720 ml  Output   3050 ml  Net  -2330 ml   Filed  Weights   06/11/14 0659 06/12/14 0832 06/13/14 0415  Weight: 80.423 kg (177 lb 4.8 oz) 78.926 kg (174 lb) 78.3 kg (172 lb 9.9 oz)    Exam:   General:  Pleasant 64 year old woman in no acute distress.  Cardiovascular: S1, S2, with no murmurs rubs or gallops.  Respiratory: Faint crackles in the bases, otherwise clear.  Abdomen: Mildly obese, positive bowel sounds, soft, nontender, nondistended.  Musculoskeletal/extremities: No acute hot red joints. No pedal edema.   Data Reviewed: Basic Metabolic Panel:  Recent Labs Lab 06/09/14 0554 06/10/14 0549 06/11/14 0601 06/12/14 [id number] 06/12/14 1220 06/13/14 0614  NA 136 136 137 135  --  135  K 4.4 4.6 4.1 3.3*  --  3.5  CL 111 109 105 99  --  101  CO2 17* 20 22 23   --  23  GLUCOSE 161* 114* 139* 128*  --  133*  BUN 63* 68* 79* 82*  --  72*  CREATININE 6.56* 6.71* 6.91* 6.58*  --  5.87*  CALCIUM 7.9* 7.9* 7.7* 7.6*  --  8.0*  MG  --   --  2.0  --  1.8  --   PHOS 5.7*  --   --  6.3*  --  5.2*   Liver Function Tests: No results for input(s): AST, ALT, ALKPHOS, BILITOT, PROT, ALBUMIN in the last 168 hours. No results for input(s): LIPASE, AMYLASE in the last 168 hours. No results for input(s): AMMONIA in the last 168 hours. CBC:  Recent Labs Lab 06/07/14 2120  06/09/14 1856 06/10/14 0549 06/11/14 0601 06/11/14 1920 06/12/14 0613 06/13/14 0614  WBC 5.6  < > 8.7 8.9 5.8 5.6 5.9 5.5  NEUTROABS 3.7  --  7.1  --   --   --   --   --   HGB 7.5*  < > 8.0* 7.9* 7.6* 9.2* 8.9* 9.4*  HCT 23.6*  < > 24.5* 24.2* 23.7* 27.2* 26.7* 27.7*  MCV 87.4  < > 87.5 86.4 85.9 86.3 85.3 84.7  PLT 292  < > 261 265 228 238 222 236  < > = values in this interval not displayed. Cardiac Enzymes:  Recent Labs Lab 06/07/14 2120  TROPONINI <0.03   BNP (last 3 results) No results for input(s): PROBNP in the last 8760 hours. CBG:  Recent Labs Lab 06/12/14 1133 06/12/14 1646 06/12/14 2138 06/13/14 0731 06/13/14 1117  GLUCAP 166* 159* 159*  134* 194*    Recent Results (from the past 240 hour(s))  Urine culture     Status: None   Collection Time: 06/07/14 10:45 PM  Result Value Ref Range Status   Specimen Description URINE, CLEAN CATCH  Final   Special Requests NONE  Final   Colony Count   Final    >=100,000 COLONIES/ML Performed at [facility]    Culture   Final    KLEBSIELLA PNEUMONIAE Performed at [facility]    Report Status 06/11/2014 FINAL  Final   Organism ID, Bacteria KLEBSIELLA PNEUMONIAE  Final      Susceptibility   Klebsiella pneumoniae - MIC*    AMPICILLIN >=32 RESISTANT Resistant     CEFAZOLIN <=4 SENSITIVE Sensitive     CEFTRIAXONE <=1 SENSITIVE Sensitive     CIPROFLOXACIN <=0.25 SENSITIVE Sensitive     GENTAMICIN <=1 SENSITIVE Sensitive     LEVOFLOXACIN <=0.12 SENSITIVE Sensitive     NITROFURANTOIN 128 RESISTANT Resistant     TOBRAMYCIN <=1 SENSITIVE Sensitive     TRIMETH/SULFA <=20 SENSITIVE Sensitive     PIP/TAZO 8 SENSITIVE Sensitive     * KLEBSIELLA PNEUMONIAE     Studies: No results found.  Scheduled Meds: . atorvastatin  10 mg Oral QHS  . calcitRIOL  0.5 mcg Oral Daily  . cefTRIAXone (ROCEPHIN)  IV  2 g Intravenous Q24H  . diltiazem  30 mg Oral 4 times per day  . [START ON 06/17/2014] ferumoxytol  510 mg Intravenous Once  . heparin  5,000 Units Subcutaneous 3 times per day  . insulin aspart  0-15 Units Subcutaneous TID WC  . insulin aspart  0-5 Units Subcutaneous QHS  . metoprolol tartrate  50 mg Oral BID  . pantoprazole  40 mg Oral Daily  . sodium bicarbonate  650 mg Oral TID  . sodium chloride  3 mL Intravenous Q12H  . Vitamin D (Ergocalciferol)  50,000 Units Oral Q7 days   Continuous Infusions: . 0.45 % NaCl with KCl 20 mEq / L 135 mL/hr at 06/13/14 1225   Assessment and plan:  Principal Problem:   Acute kidney injury Active Problems:   Metabolic acidosis   Acute respiratory failure   UTI (urinary tract infection)   Atrial fibrillation with RVR    A-fib   Syncope and collapse   Pulmonary hypertension   Normocytic anemia   Diabetes mellitus without complication   Essential hypertension   Hyperlipidemia   Edema extremities   Faintness   Contusion of face   Acute on chronic diastolic heart failure   Acute diastolic heart failure   Hypokalemia   1. Acute respiratory failure with hypoxia. Etiology secondary to acute on chronic diastolic heart failure. The patient received IV fluid hydration following admission for treatment of acute  renal failure. She was treated successfully with IV Lasix. Her follow-up chest x-ray revealed resolution of pulmonary edema. Her oxygen saturations have improved and will need to be monitored on room air. Per cardiology, once the patient is euvolemic and better from her acute illnesses, he recommended repeating the echocardiogram to reevaluate pulmonary artery pressure and if persistently elevated, may warrant further investigation including an RHC. Patient's ANA was negative.  Acute on chronic diastolic heart failure.  Her 2-D echocardiogram revealed an ejection fraction of [id number], grade 2 diastolic dysfunction, mild mitral stenosis, severe left atrial enlargement, and pulmonary hypertension. She was adequately diuresed on IV Lasix for several days and off of IV fluids. IV fluids have been restarted and Lasix is being held per nephrology given that the patient's urine output is increasing and may increase even further as her kidney function improves. Her I/O is -13 L so far.  Atrial fibrillation, likely paroxysmal. New diagnosis. She is back in normal sinus rhythm per exam. She was continued on her beta blocker and diltiazem was started. We'll transition diltiazem to 120-180 mg daily on 06/14/14. Her TSH was within normal limits and the results of her 2-D echocardiogram were dictated above. VQ scan revealed low probability for PE. Cardiology consulted and changed her beta blocker from atenolol to  metoprolol because of her renal function. Cardiology did not recommend starting anticoagulation at this time given that the etiology of her anemia is unknown and that she had 2 episodes of syncope prior to the hospitalization.  Hypertension. Her blood pressure is trending up. We'll adjust diltiazem and transitione to once daily tomorrow morning continue metoprolol.  Acute kidney injury superimposed on likely chronic kidney disease. Baseline creatinine is unknown. Etiology likely secondary to prerenal azotemia, but could be multifactorial including ATN/ACE/ARB therapy. Renal ultrasound revealed diffusely increased renal parenchymal echogenicity, consistent with medical renal disease. Nephrology was consulted and is assisting with management.  IV Lasix and IV fluids were started and adjusted accordingly. Her renal function/creatinine is actually improving. Her urine output continues to be within normal limits and increasing.  continue to monitor.  Will discontinue Foley catheter soon, but will continue strict ins and outs.    UTI, secondary to Klebsiella pneumoniae. We'll continue Rocephin.  Metabolic acidosis, likely secondary to acute renal failure. Resolved status post treatment with sodium bicarbonate.  Hypokalemia. Continue potassium chloride repletion/supplementation as needed.  Normocytic anemia. Etiology unknown, but likely associated with chronic disease. The patient has no active signs or symptoms of GI or GU bleeding. Anemia panel noted for a low iron level. Status post a total of 2 units of packed red blood cells with improvement in her hemoglobin. She is also status post IV iron per nephrology. She will need outpatient GI referral/workup.  Type 2 diabetes mellitus. Her hemoglobin A1c was 5.4 recently. Given her acute renal failure and metabolic acidosis, would not restart oral medications. Would recommend long-acting insulin at the time of discharge.  Syncope and  collapse. Likely secondary to dehydration and orthostasis. CT scan of her head was negative. No neurological findings on exam.      Time spent: 30 minutes    [facility] Pager [phone] . If 7PM-7AM, please contact night-coverage at [email or web address], password [facility] 06/13/2014, 3:48 PM  LOS: 6 days

## 2014-06-13 NOTE — Progress Notes (Signed)
Subjective: Interval History: Patient states that she's feeling much better. She denies any difficulty in breathing and her appetite is good.  Objective: Vital signs in last 24 hours: Temp:  [98.2 F (36.8 C)-99.9 F (37.7 C)] 98.2 F (36.8 C) (01/09 [id number]) Pulse Rate:  [68-82] 73 (01/09 0633) Resp:  [18] 18 (01/09 0633) BP: (151-171)/(47-70) 168/60 mmHg (01/09 0633) SpO2:  [93 %-97 %] 97 % (01/09 0633) Weight:  [78.3 kg (172 lb 9.9 oz)] 78.3 kg (172 lb 9.9 oz) (01/09 0415) Weight change:   Intake/Output from previous day: 01/08 0701 - 01/09 0700 In: 480 [P.O.:480] Out: 4550 [Urine:4550] Intake/Output this shift:    General appearance: alert, cooperative and no distress Resp: clear to auscultation bilaterally Cardio: regular rate and rhythm, S1, S2 normal, no murmur, click, rub or gallop GI: soft, non-tender; bowel sounds normal; no masses,  no organomegaly Extremities: edema Trace edema bilaterally  Lab Results:  Recent Labs  06/12/14 0613 06/13/14 0614  WBC 5.9 5.5  HGB 8.9* 9.4*  HCT 26.7* 27.7*  PLT 222 236   BMET:   Recent Labs  06/12/14 0613 06/13/14 0614  NA 135 135  K 3.3* 3.5  CL 99 101  CO2 23 23  GLUCOSE 128* 133*  BUN 82* 72*  CREATININE 6.58* 5.87*  CALCIUM 7.6* 8.0*   No results for input(s): PTH in the last 72 hours. Iron Studies: No results for input(s): IRON, TIBC, TRANSFERRIN, FERRITIN in the last 72 hours.  Studies/Results: Dg Chest 1 View  06/11/2014   CLINICAL DATA:  Cough and hypertension, history of atrial fibrillation  EXAM: CHEST - 1 VIEW  COMPARISON:  06/10/2014  FINDINGS: Cardiac shadow is stable. Poor inspiratory effort is again noted although the vascular congestion has resolved in the interval from the prior exam. No focal infiltrate is seen. No bony abnormality is noted.  IMPRESSION: Resolution of previously seen congestive failure.   Electronically Signed   By: Inez Catalina M.D.   On: 06/11/2014 15:05   Nm Pulmonary Perf And  Vent  06/11/2014   CLINICAL DATA:  Shortness of breath.  EXAM: NUCLEAR MEDICINE VENTILATION - PERFUSION LUNG SCAN  TECHNIQUE: Ventilation images were obtained in multiple projections using inhaled aerosol technetium 99 M DTPA. Perfusion images were obtained in multiple projections after intravenous injection of Tc-21m MAA.  RADIOPHARMACEUTICALS:  40.0 mCi Tc-34m DTPA aerosol and 6.0 mCi Tc-69m MAA  COMPARISON:  Chest radiograph of same day.  FINDINGS: Ventilation: Small subsegmental defect is noted in the posterior segment of the right lower lobe.  Perfusion: Small subsegmental defect is noted and posterior segment of right lower lobe which matches abnormality described on ventilation exam. No other significant defects are noted.  IMPRESSION: Low probability of pulmonary embolus.   Electronically Signed   By: Sabino Dick M.D.   On: 06/11/2014 15:07    I have reviewed the patient's current medications.  Assessment/Plan: Problem #1 acute kidney injury superimposed on chronic. The etiology for her renal failure was felt to be secondary to prerenal syndrome/ATN/ACE/ARBS. Patient presently non-oliguric. Presently her BUN and creatinine is improving. Patient seems to be on polyuric phase of recovery ATN. Problem #2 possible chronic renal failure. No previous blood work and is very difficult to determine the stage. Patient with bilateral increased diffused echogenicity/history of kidney stone/proteinuria. Problem #3 anemia: Her hemoglobin is below our target goal however seems to be improving. Patient has received 1 dose of IV iron. Problem #4 diabetes Problem #5 hypertension: Her blood pressure is  reasonably controlled Problem #6 metabolic acidosis: her CO2 is 23 and stable. Problem #7 metabolic bone disease f disease: Calcium is range however her phosphorus is high. Today her phosphorus however has improved. Problem #8 vitamin D deficiency: Patient on vitamin D supplement. Problem #9 hypokalemia : Patient  on potassium supplement and her potassium has improved. Plan: We'll continue his present management We'll check her basic metabolic panel in the morning.   LOS: 6 days   Angelette Ganus S 06/13/2014,9:51 AM

## 2014-06-14 DIAGNOSIS — I5031 Acute diastolic (congestive) heart failure: Secondary | ICD-10-CM

## 2014-06-14 LAB — BASIC METABOLIC PANEL
ANION GAP: 10 (ref 5–15)
BUN: 63 mg/dL — AB (ref 6–23)
CO2: 23 mmol/L (ref 19–32)
CREATININE: 5.38 mg/dL — AB (ref 0.50–1.10)
Calcium: 8.3 mg/dL — ABNORMAL LOW (ref 8.4–10.5)
Chloride: 106 mEq/L (ref 96–112)
GFR, EST AFRICAN AMERICAN: 9 mL/min — AB (ref >90)
GFR, EST NON AFRICAN AMERICAN: 8 mL/min — AB (ref >90)
Glucose, Bld: 176 mg/dL — ABNORMAL HIGH (ref 70–99)
Potassium: 4 mmol/L (ref 3.5–5.1)
Sodium: 139 mmol/L (ref 135–145)

## 2014-06-14 LAB — CBC
HCT: 27.2 % — ABNORMAL LOW (ref 36.0–46.0)
HEMOGLOBIN: 9.2 g/dL — AB (ref 12.0–15.0)
MCH: 28.9 pg (ref 26.0–34.0)
MCHC: 33.8 g/dL (ref 30.0–36.0)
MCV: 85.5 fL (ref 78.0–100.0)
Platelets: 219 10*3/uL (ref 150–400)
RBC: 3.18 MIL/uL — ABNORMAL LOW (ref 3.87–5.11)
RDW: 13.6 % (ref 11.5–15.5)
WBC: 5.9 10*3/uL (ref 4.0–10.5)

## 2014-06-14 LAB — UREA NITROGEN, URINE: Urea Nitrogen, Ur: 157 mg/dL

## 2014-06-14 LAB — GLUCOSE, CAPILLARY
GLUCOSE-CAPILLARY: 165 mg/dL — AB (ref 70–99)
Glucose-Capillary: 202 mg/dL — ABNORMAL HIGH (ref 70–99)

## 2014-06-14 MED ORDER — CEFUROXIME AXETIL 500 MG PO TABS: 500.0000 mg | ORAL_TABLET | Freq: Two times a day (BID) | ORAL | Status: DC

## 2014-06-14 MED ORDER — OMEPRAZOLE 20 MG PO CPDR: 20.0000 mg | DELAYED_RELEASE_CAPSULE | Freq: Every day | ORAL | Status: DC

## 2014-06-14 MED ORDER — SODIUM BICARBONATE 650 MG PO TABS: 650.0000 mg | ORAL_TABLET | Freq: Two times a day (BID) | ORAL | Status: DC

## 2014-06-14 MED ORDER — METOPROLOL TARTRATE 50 MG PO TABS: 50.0000 mg | ORAL_TABLET | Freq: Two times a day (BID) | ORAL | Status: DC

## 2014-06-14 MED ORDER — DILTIAZEM HCL ER COATED BEADS 180 MG PO CP24
180.0000 mg | ORAL_CAPSULE | Freq: Every day | ORAL | Status: DC
  Administered 2014-06-14: 180 mg via ORAL
  Filled 2014-06-14: qty 1

## 2014-06-14 MED ORDER — VITAMIN D (ERGOCALCIFEROL) 1.25 MG (50000 UNIT) PO CAPS: 50000.0000 [IU] | ORAL_CAPSULE | ORAL | Status: DC

## 2014-06-14 MED ORDER — FERROUS SULFATE 325 (65 FE) MG PO TBEC: 325.0000 mg | DELAYED_RELEASE_TABLET | Freq: Every day | ORAL | Status: DC

## 2014-06-14 MED ORDER — GLIMEPIRIDE 2 MG PO TABS: ORAL_TABLET | ORAL | Status: AC

## 2014-06-14 MED ORDER — GI COCKTAIL ~~LOC~~
30.0000 mL | Freq: Three times a day (TID) | ORAL | Status: DC | PRN
  Administered 2014-06-14: 30 mL via ORAL
  Filled 2014-06-14: qty 30

## 2014-06-14 MED ORDER — DILTIAZEM HCL ER COATED BEADS 180 MG PO CP24: 180.0000 mg | ORAL_CAPSULE | Freq: Every day | ORAL | Status: AC

## 2014-06-14 MED ORDER — SODIUM CHLORIDE 0.9 % IV SOLN: INTRAVENOUS | Status: DC

## 2014-06-14 NOTE — Discharge Summary (Signed)
Physician Discharge Summary  Brenda Lee [id number] DOB: [birth year] DOA: 06/07/2014  PCP: Glenda Chroman., MD  Admit date: 06/07/2014 Discharge date: 06/14/2014  Time spent: Greater than 30 minutes  Recommendations for Outpatient Follow-up:  1. Recommend follow-up of the patient's renal function and serum potassium. 2. 24-hour urine for IFE result was pending at the time of discharge. 3. Recommend reassessment of the patient's blood pressure and heart rate on new medications metoprolol and diltiazem. 4. Recommend referral to gastroenterology for outpatient evaluation of iron deficiency anemia. 5. Home health physical therapy was ordered at discharge.   Discharge Diagnoses:  1. Syncope and collapse secondary to dehydration and orthostasis. Resolved. 2. Acute kidney injury, secondary to prerenal azotemia and ATN in the setting of ACE/ARB therapy. 3. Likely chronic kidney disease given the results of the renal ultrasound, stage unknown. ----Admission BUN was 64 and creatinine was 6.55. Discharge BUN was 63 and creatinine was [id number]. 4. Metabolic acidosis secondary to acute renal failure. Resolved. 5.. Acute respiratory failure with hypoxia, secondary to acute on chronic diastolic dysfunction. --- Echocardiogram revealed an ejection fraction of 123456, grade 2 diastolic dysfunction, mild mitral stenosis, severe left atrial enlargement, and pulmonary hypertension. --- Hypoxia resolved. 6. Acute on chronic diastolic heart failure. 7. Newly diagnosed paroxysmal atrial fibrillation. --- Rhythm returned to normal sinus rhythm. 8. Diabetes mellitus, type II. --- Hemoglobin A1c was 5.3 recently. 9. Klebsiella pneumonia urinary tract infection. 10. Hypokalemia, supplemented and repleted. 11. Hyperlipidemia. Lipid panel revealed an HDL of 29 and LDL cholesterol of 118. 12. Essential hypertension. 13. Normocytic anemia, iron deficiency. --- Status post 2 units of packed red blood cells and IV  iron. --- Hemoglobin 9.2 upon discharge.    Discharge Condition: Improved.  Diet recommendation: Heart healthy/carbohydrate modified.  Filed Weights   06/12/14 0832 06/13/14 0415 06/14/14 0619  Weight: 78.926 kg (174 lb) 78.3 kg (172 lb 9.9 oz) 81.647 kg (180 lb)    History of present illness:   The patient is a 64 year old woman with a history of type 2 diabetes mellitus and hypertension, who presented to the emergency department on 06/07/14 after passing out at home. In the ED, she was afebrile and hemodynamically stable. Her lab data were significant for a BUN of 64, creatinine of 6.55, normal troponin I, glucose of 91, hemoglobin of 7.5, and a urinalysis that revealed many bacteria, many squamous cells, 3-6 WBCs, and 100 protein. CT of her head revealed no acute intracranial findings, but with chronic atrophy and small vessel ischemic changes. Her chest x-ray revealed no active cardiopulmonary disease. She was admitted for further evaluation and management.    Hospital Course:  Acute kidney injury superimposed on likely chronic kidney disease. Baseline creatinine was unknown. The patient was started on vigorous IV fluids. ACE inhibitor and ARB were discontinued.  Nephrology was consulted. During hospital course, IV fluids were adjusted and the patient was started on Lasix for several days following his discontinuation and restart and eventual discontinuation again. A number of studies were ordered. Renal ultrasound revealed diffusely increased renal parenchymal echogenicity, consistent with medical renal disease. ANA was negative. ASO was negative. C3 and C4 complement were within normal limits. Hepatitis C serology was negative. Hepatitis B surface AG was negative. Urinalysis was positive for infection. She was started on Rocephin. 24-hour urine for protein electrophoresis was pending at the time of discharge. During hospitalization, the patient's urine output was nonoliguric. There  was a discussion with the patient by nephrology for the possibility of starting hemodialysis.  However, the patient's creatinine began to improve. She began to diuresis significantly. The etiology of her acute renal failure was thought to be secondary to ATN with contribution from ARB/ACE inhibitor therapy. When her creatinine began to decrease, nephrology recommended discharge with the patient's close follow-up with Dr. Lowanda Foster. UTI, secondary to Klebsiella pneumoniae. The patient was started on Rocephin empirically. A urine culture was ordered in the emergency department. It eventually grew out greater than 100,000 colonies of Klebsiella pneumoniae, sensitive to Rocephin. She received several days of IV therapy and was discharged on a few more days of Ceftin. Acute respiratory failure with hypoxia. The patient's oxygen saturations were noted to fall in the 80s during the hospital course. A chest x-ray was ordered and revealed pulmonary edema. She was noted to have a history of diastolic dysfunction., Therefore, the etiology was secondary to acute on chronic diastolic heart failur as the patient received IV fluid hydration following admission for treatment of acute renal failure. She was treated successfully with IV Lasix. Her follow-up chest x-ray revealed resolution of pulmonary edema. Her oxygen saturations have improved and she was oxygenating in the 90s on room air at the time of discharge. Acute on chronic diastolic heart failure. Her 2-D echocardiogram revealed an ejection fraction of 123456, grade 2 diastolic dysfunction, mild mitral stenosis, severe left atrial enlargement, and pulmonary hypertension. She was adequately diuresed on IV Lasix for several days and off of IV fluids. IV fluids were restarted and Lasix was eventually held per nephrology given that the patient's urine output was increasing as she may have had post ATN diuresis. Her I/O was -16 L at the time of discharge. Cardiology was  consulted. Per cardiology, once the patient is euvolemic and better from her acute illnesses, he recommended repeating the echocardiogram to reevaluate pulmonary artery pressure and if persistently elevated, may warrant further investigation including an RHC. Patient's ANA was negative. Atrial fibrillation, likely paroxysmal. New diagnosis. The patient developed newly diagnosed paroxysmal atrial fibrillation with rapid ventricular response. She was treated with IV diltiazem and then transitioned to oral diltiazem. She was continued on treatment with atenolol. Cardiology consulted and changed her beta blocker from atenolol to metoprolol because of her renal function. Cardiology did not recommend starting anticoagulation at this time given that the etiology of her anemia was unknown and that she had 2 episodes of syncope prior to the hospitalization. Her rhythm converted to normal sinus and she remained in normal sinus rhythm. Her TSH was within normal limits and the results of her 2-D echocardiogram were dictated above. VQ scan revealed low probability for PE. Hypertension. ACE inhibitor and ARB were withheld. She was restarted on atenolol. Eventually, diltiazem was added for both rate control and blood pressure control. As stated above, it was discontinued in favor of metoprolol by cardiology. The patient's blood pressure was stable at the time of discharge. Metabolic acidosis, likely secondary to acute renal failure. The patient's CO2 was low on admission, secondary to acute renal failure. Following bicarbonate added to the IV fluids and oral bicarbonate, her CO2 normalized. She was discharged on oral sodium bicarbonate tablets. Hypokalemia. She was repleted/supplemented orally and in the IV fluids. Her serum potassium normalized. Normocytic anemia. The etiology was unknown, but was likely associated with chronic disease. The patient had no active signs or symptoms of GI or GU bleeding. Per family,  she had refused outpatient colonoscopy. Anemia panel noted for a low iron level. She was transfused a total of 2 units of packed red  blood cells with improvement in her hemoglobin. She also received IV iron per nephrology. She will need outpatient GI referral/workup if she agrees to it. Type 2 diabetes mellitus. Her hemoglobin A1c was 5.4 recently. Her oral medications were withheld and she was treated with sliding scale NovoLog. Given her acute renal failure and metabolic acidosis, metformin was discontinued indefinitely. At the time of discharge, she was instructed to take Amaryl once daily, but only if her capillary blood glucose was greater than 160. She voiced understanding. Syncope and collapse. Likely secondary to dehydration and orthostasis. CT scan of her head was negative. There were no worrisome neurological findings on exam or during the hospitalization.     Procedures: VQ scan low probability PE  2-D echocardiogram:- Left ventricle: The cavity size was normal. Wall thickness was normal. Systolic function was normal. The estimated ejection fraction was in the range of 60% to 65%. Wall motion was normal; there were no regional wall motion abnormalities. Features are consistent with a pseudonormal left ventricular filling pattern, with concomitant abnormal relaxation and increased filling pressure (grade 2 diastolic dysfunction). - Aortic valve: Moderately calcified annulus. Trileaflet; moderately thickened leaflets. - Mitral valve: Mildly to moderately calcified annulus. Mildly thickened leaflets . The findings are consistent with mild to moderate stenosis. There was mild regurgitation. Mean gradient (D): 5 mm Hg. - Left atrium: The atrium was severely dilated. - Right ventricle: The cavity size was moderately dilated. It shares the apex with the LV. Systolic function was normal. RV TAPSe is 2.0 cm. - Right atrium: The atrium was moderately  dilated. - Pulmonary arteries: Systolic pressure was severely increased. PA peak pressure: 74 mm Hg (S). - Technically difficult study.   Consultations:  Cardiology  Nephrology  Discharge Exam: Filed Vitals:   06/14/14 1349  BP: 185/84  Pulse: 79  Temp: 98.4 F (36.9 C)  Resp: 18    General: Pleasant 64 year old woman in no acute distress.  Cardiovascular: S1, S2, with no murmurs rubs or gallops.  Respiratory: Faint crackles in the bases, otherwise clear.  Abdomen: Mildly obese, positive bowel sounds, soft, nontender, nondistended.  Musculoskeletal/extremities: No acute hot red joints. No pedal edema.      Discharge Instructions   Discharge Instructions    Diet general    Complete by:  As directed      Discharge instructions    Complete by:  As directed   1. Medication changes were outlined on your medication list. 2. You are anemic and will need to have your doctor refer you to a gastroenterologist for further evaluation. 3. Call [facility] and Dr. Nelly Laurence office for hospital follow-up appointments.     Increase activity slowly    Complete by:  As directed           Current Discharge Medication List    START taking these medications   Details  cefUROXime (CEFTIN) 500 MG tablet Take 1 tablet (500 mg total) by mouth 2 (two) times daily with a meal. Antibiotic to be taken for 4 more days. Qty: 8 tablet, Refills: 0    diltiazem (CARDIZEM CD) 180 MG 24 hr capsule Take 1 capsule (180 mg total) by mouth daily. New medication for your blood pressure and heart rate. Qty: 30 capsule, Refills: 3    ferrous sulfate 325 (65 FE) MG EC tablet Take 1 tablet (325 mg total) by mouth daily. Iron supplement. Take with food.    metoprolol (LOPRESSOR) 50 MG tablet Take 1 tablet (50 mg total) by mouth 2 (  two) times daily. New medication for your blood pressure and heart rate. Qty: 60 tablet, Refills: 3    omeprazole (PRILOSEC) 20 MG capsule Take 1 capsule (20 mg total)  by mouth daily. For gastric acid reflux. Qty: 30 capsule, Refills: 1.    sodium bicarbonate 650 MG tablet Take 1 tablet (650 mg total) by mouth 2 (two) times daily. Medication to replace your bicarbonate losses. Qty: 60 tablet, Refills: 3    Vitamin D, Ergocalciferol, (DRISDOL) 50000 UNITS CAPS capsule Take 1 capsule (50,000 Units total) by mouth every 7 (seven) days. Qty: 30 capsule, Refills: 3      CONTINUE these medications which have CHANGED   Details  glimepiride (AMARYL) 2 MG tablet Take one-half tablet daily only if your blood sugar is greater than 160. Check your blood sugar daily.      CONTINUE these medications which have NOT CHANGED   Details  aspirin EC 81 MG tablet Take 81 mg by mouth daily.    clonazePAM (KLONOPIN) 0.5 MG tablet Take 0.5 mg by mouth 2 (two) times daily as needed for anxiety.  Refills: 2    fexofenadine (ALLEGRA) 180 MG tablet Take 180 mg by mouth daily. Refills: 0    fluticasone (FLONASE) 50 MCG/ACT nasal spray Place 2 sprays into both nostrils daily. Qty: 16 g, Refills: 2    sodium chloride (OCEAN) 0.65 % SOLN nasal spray Place 1 spray into both nostrils as needed for congestion. Qty: 480 mL, Refills: 0    atorvastatin (LIPITOR) 10 MG tablet Take 10 mg by mouth at bedtime.      STOP taking these medications     atenolol (TENORMIN) 50 MG tablet      amLODipine-valsartan (EXFORGE) 10-320 MG per tablet      azithromycin (ZITHROMAX) 250 MG tablet        Allergies  Allergen Reactions  . Morphine And Related Nausea And Vomiting   Follow-up Information    Follow up with [facility].   Contact information:   [address] [place] [address] [phone]       Follow up with [facility], [place].   Specialty:  Nephrology   Why:   Call for the appointment to follow-upwith the kidney doctor, Dr. Vivi Ferns information:   [address] [address] [phone]       Follow  up with Arnoldo Lenis, MD.   Specialty:  Cardiology   Why:  Cardiologist   Contact information:   [address] [place] [address] [phone]       Follow up with VYAS,DHRUV B., MD.   Specialty:  Internal Medicine   Why:  As needed   Contact information:   [place] [address] [phone]        The results of significant diagnostics from this hospitalization (including imaging, microbiology, ancillary and laboratory) are listed below for reference.    Significant Diagnostic Studies: Dg Chest 1 View  06/11/2014   CLINICAL DATA:  Cough and hypertension, history of atrial fibrillation  EXAM: CHEST - 1 VIEW  COMPARISON:  06/10/2014  FINDINGS: Cardiac shadow is stable. Poor inspiratory effort is again noted although the vascular congestion has resolved in the interval from the prior exam. No focal infiltrate is seen. No bony abnormality is noted.  IMPRESSION: Resolution of previously seen congestive failure.   Electronically Signed   By: Inez Catalina M.D.   On: 06/11/2014 15:05  Dg Chest 1 View  06/10/2014   CLINICAL DATA:  Productive cough for 3 weeks. Syncope and sudden onset of shortness of breath. Initial encounter.  EXAM: CHEST - 1 VIEW  COMPARISON:  Chest radiograph from 06/07/2014  FINDINGS: The lungs are mildly hypoexpanded. New vascular congestion is noted, with increased interstitial markings, concerning for pulmonary edema. No definite pleural effusion or pneumothorax is seen.  The cardiomediastinal silhouette is mildly enlarged. No acute osseous abnormalities are identified.  IMPRESSION: Lungs mildly hypoexpanded. New vascular congestion and mild cardiomegaly, with increased interstitial markings, concerning for pulmonary edema.   Electronically Signed   By: Garald Balding M.D.   On: 06/10/2014 02:57   Dg Chest 2 View  06/07/2014   CLINICAL DATA:  Productive cough for 3 weeks. Vomiting. Near syncope tonight. Multiple falls over the last few days.  EXAM:  CHEST  2 VIEW  COMPARISON:  04/25/2014  FINDINGS: Shallow inspiration with elevation of the right hemidiaphragm. Normal heart size and pulmonary vascularity. No focal airspace disease or consolidation in the lungs. No blunting of costophrenic angles. No pneumothorax. Mediastinal contours appear intact. Degenerative changes in the spine. Calcified and tortuous aorta.  IMPRESSION: No active cardiopulmonary disease.   Electronically Signed   By: Lucienne Capers M.D.   On: 06/07/2014 22:11   Ct Head Wo Contrast  06/07/2014   CLINICAL DATA:  Near syncope tonight. Symptoms are exacerbated when bending forward to. Multiple falls over the past few days. Facial bruising.  EXAM: CT HEAD WITHOUT CONTRAST  TECHNIQUE: Contiguous axial images were obtained from the base of the skull through the vertex without intravenous contrast.  COMPARISON:  None.  FINDINGS: Diffuse cerebral atrophy. Mild ventricular dilatation consistent with central atrophy. Patchy periventricular changes in the deep white matter consistent with small vessel ischemia. No mass effect or midline shift. No abnormal extra-axial fluid collections. Gray-white matter junctions are distinct. Basal cisterns are not effaced. No evidence of acute intracranial hemorrhage. No depressed skull fractures. Visualized paranasal sinuses and mastoid air cells are not opacified. Vascular calcifications.  IMPRESSION: No acute intracranial abnormalities. Chronic atrophy and small vessel ischemic changes.   Electronically Signed   By: Lucienne Capers M.D.   On: 06/07/2014 22:13   US Renal  06/08/2014   CLINICAL DATA:  Acute on chronic renal failure. Nephrolithiasis. Diabetes and hypertension.  EXAM: RENAL/URINARY TRACT ULTRASOUND COMPLETE  COMPARISON:  None.  FINDINGS: Right Kidney:  Length: 12.8 cm. Diffusely increased renal parenchymal echogenicity. No mass or hydronephrosis visualized.  Left Kidney:  Length: 12.2 cm. Diffusely increased renal parenchymal echogenicity. No  mass or hydronephrosis visualized.  Bladder:  Nearly completely empty, and therefore not well visualized.  IMPRESSION: No evidence of hydronephrosis.  Diffusely increased renal parenchymal echogenicity, consistent with medical renal disease.   Electronically Signed   By: Earle Gell M.D.   On: 06/08/2014 13:16   Nm Pulmonary Perf And Vent  06/11/2014   CLINICAL DATA:  Shortness of breath.  EXAM: NUCLEAR MEDICINE VENTILATION - PERFUSION LUNG SCAN  TECHNIQUE: Ventilation images were obtained in multiple projections using inhaled aerosol technetium 99 M DTPA. Perfusion images were obtained in multiple projections after intravenous injection of Tc-54m MAA.  RADIOPHARMACEUTICALS:  40.0 mCi Tc-46m DTPA aerosol and 6.0 mCi Tc-64m MAA  COMPARISON:  Chest radiograph of same day.  FINDINGS: Ventilation: Small subsegmental defect is noted in the posterior segment of the right lower lobe.  Perfusion: Small subsegmental defect is noted and posterior segment of right lower lobe which matches abnormality  described on ventilation exam. No other significant defects are noted.  IMPRESSION: Low probability of pulmonary embolus.   Electronically Signed   By: Sabino Dick M.D.   On: 06/11/2014 15:07    Microbiology: Recent Results (from the past 240 hour(s))  Urine culture     Status: None   Collection Time: 06/07/14 10:45 PM  Result Value Ref Range Status   Specimen Description URINE, CLEAN CATCH  Final   Special Requests NONE  Final   Colony Count   Final    >=100,000 COLONIES/ML Performed at [facility]    Culture   Final    KLEBSIELLA PNEUMONIAE Performed at [facility]    Report Status 06/11/2014 FINAL  Final   Organism ID, Bacteria KLEBSIELLA PNEUMONIAE  Final      Susceptibility   Klebsiella pneumoniae - MIC*    AMPICILLIN >=32 RESISTANT Resistant     CEFAZOLIN <=4 SENSITIVE Sensitive     CEFTRIAXONE <=1 SENSITIVE Sensitive     CIPROFLOXACIN <=0.25 SENSITIVE Sensitive     GENTAMICIN  <=1 SENSITIVE Sensitive     LEVOFLOXACIN <=0.12 SENSITIVE Sensitive     NITROFURANTOIN 128 RESISTANT Resistant     TOBRAMYCIN <=1 SENSITIVE Sensitive     TRIMETH/SULFA <=20 SENSITIVE Sensitive     PIP/TAZO 8 SENSITIVE Sensitive     * KLEBSIELLA PNEUMONIAE     Labs: Basic Metabolic Panel:  Recent Labs Lab 06/09/14 0554 06/10/14 0549 06/11/14 0601 06/12/14 0613 06/12/14 1220 06/13/14 0614 06/14/14 0550  NA 136 136 137 135  --  135 139  K 4.4 4.6 4.1 3.3*  --  3.5 4.0  CL 111 109 105 99  --  101 106  CO2 17* 20 22 23   --  23 23  GLUCOSE 161* 114* 139* 128*  --  133* 176*  BUN 63* 68* 79* 82*  --  72* 63*  CREATININE 6.56* 6.71* 6.91* 6.58*  --  5.87* 5.38*  CALCIUM 7.9* 7.9* 7.7* 7.6*  --  8.0* 8.3*  MG  --   --  2.0  --  1.8  --   --   PHOS 5.7*  --   --  6.3*  --  5.2*  --    Liver Function Tests: No results for input(s): AST, ALT, ALKPHOS, BILITOT, PROT, ALBUMIN in the last 168 hours. No results for input(s): LIPASE, AMYLASE in the last 168 hours. No results for input(s): AMMONIA in the last 168 hours. CBC:  Recent Labs Lab 06/07/14 2120  06/09/14 1856  06/11/14 0601 06/11/14 1920 06/12/14 0613 06/13/14 0614 06/14/14 0550  WBC 5.6  < > 8.7  < > 5.8 5.6 5.9 5.5 5.9  NEUTROABS 3.7  --  7.1  --   --   --   --   --   --   HGB 7.5*  < > 8.0*  < > 7.6* 9.2* 8.9* 9.4* 9.2*  HCT 23.6*  < > 24.5*  < > 23.7* 27.2* 26.7* 27.7* 27.2*  MCV 87.4  < > 87.5  < > 85.9 86.3 85.3 84.7 85.5  PLT 292  < > 261  < > 228 238 222 236 219  < > = values in this interval not displayed. Cardiac Enzymes:  Recent Labs Lab 06/07/14 2120  TROPONINI <0.03   BNP: BNP (last 3 results) No results for input(s): PROBNP in the last 8760 hours. CBG:  Recent Labs Lab 06/13/14 1117 06/13/14 1640 06/13/14 2056 06/14/14 0724 06/14/14 1119  GLUCAP  194* 225* 192* 165* 202*       Signed:  Lonnel Gjerde  [facility] 06/14/2014, 1:58 PM

## 2014-06-14 NOTE — Progress Notes (Signed)
Subjective: Interval History: No new complaints. Her appetite is good and no nausea or vomiting.  Objective: Vital signs in last 24 hours: Temp:  [98.2 F (36.8 C)-98.8 F (37.1 C)] 98.2 F (36.8 C) (01/10 0619) Pulse Rate:  [72-75] 72 (01/10 0942) Resp:  [17-18] 17 (01/10 0619) BP: (163-186)/(57-72) 186/72 mmHg (01/10 0942) SpO2:  [96 %-97 %] 97 % (01/10 0619) Weight:  [81.647 kg (180 lb)] 81.647 kg (180 lb) (01/10 0619) Weight change: 2.722 kg (6 lb)  Intake/Output from previous day: 01/09 0701 - 01/10 0700 In: 480 [P.O.:480] Out: 2550 [Urine:2550] Intake/Output this shift:    General appearance: alert, cooperative and no distress Resp: clear to auscultation bilaterally Cardio: regular rate and rhythm, S1, S2 normal, no murmur, click, rub or gallop GI: soft, non-tender; bowel sounds normal; no masses,  no organomegaly Extremities: edema Trace edema bilaterally  Lab Results:  Recent Labs  06/13/14 0614 06/14/14 0550  WBC 5.5 5.9  HGB 9.4* 9.2*  HCT 27.7* 27.2*  PLT 236 219   BMET:   Recent Labs  06/13/14 0614 06/14/14 0550  NA 135 139  K 3.5 4.0  CL 101 106  CO2 23 23  GLUCOSE 133* 176*  BUN 72* 63*  CREATININE 5.87* 5.38*  CALCIUM 8.0* 8.3*   No results for input(s): PTH in the last 72 hours. Iron Studies: No results for input(s): IRON, TIBC, TRANSFERRIN, FERRITIN in the last 72 hours.  Studies/Results: No results found.  I have reviewed the patient's current medications.  Assessment/Plan: Problem #1 acute kidney injury superimposed on chronic. The etiology for her renal failure was felt to be secondary to prerenal syndrome/ATN/ACE/ARBS. Patient presently non-oliguric. Presently her BUN and creatinine is progressively improving. Patient at this moment is symptomatic. Problem #2 possible chronic renal failure. No previous blood work and is very difficult to determine the stage. Patient with bilateral increased diffused echogenicity/history of kidney  stone/proteinuria. Problem #3 anemia: Her hemoglobin is low but stable. Problem #4 diabetes Problem #5 hypertension: Her blood pressure is reasonably controlled Problem #6 metabolic acidosis: her CO2 is 23 and stable. Problem #7 metabolic bone disease f disease: Calcium is range and her phosphorus is improving. Problem #8 vitamin D deficiency: Patient on vitamin D supplement. Problem #9 hypokalemia : Patient on potassium supplement and her potassium has corrected. Plan: We'll change her IV fluid to normal saline at 135 mL per hour We'll check her basic metabolic panel and phosphorus in the morning.   LOS: 7 days   Aleigh Grunden S 06/14/2014,9:59 AM

## 2014-06-16 LAB — UIFE/LIGHT CHAINS/TP QN, 24-HR UR
ALBUMIN, U: DETECTED
Alpha 1, Urine: DETECTED — AB
Alpha 2, Urine: DETECTED — AB
BETA UR: DETECTED — AB
GAMMA UR: DETECTED — AB
Total Protein, Urine: 179 mg/dL — ABNORMAL HIGH (ref 5–24)

## 2014-06-16 NOTE — Care Management Utilization Note (Signed)
UR complete 

## 2014-06-19 ENCOUNTER — Ambulatory Visit (INDEPENDENT_AMBULATORY_CARE_PROVIDER_SITE_OTHER): Payer: BLUE CROSS/BLUE SHIELD | Admitting: Cardiology

## 2014-06-19 ENCOUNTER — Encounter: Payer: Self-pay | Admitting: Cardiology

## 2014-06-19 ENCOUNTER — Encounter: Payer: Self-pay | Admitting: *Deleted

## 2014-06-19 VITALS — BP 182/81 | HR 74 | Ht 67.0 in | Wt 175.8 lb

## 2014-06-19 DIAGNOSIS — I272 Pulmonary hypertension, unspecified: Secondary | ICD-10-CM

## 2014-06-19 DIAGNOSIS — I4891 Unspecified atrial fibrillation: Secondary | ICD-10-CM

## 2014-06-19 DIAGNOSIS — I270 Primary pulmonary hypertension: Secondary | ICD-10-CM

## 2014-06-19 DIAGNOSIS — I10 Essential (primary) hypertension: Secondary | ICD-10-CM

## 2014-06-19 DIAGNOSIS — I5032 Chronic diastolic (congestive) heart failure: Secondary | ICD-10-CM

## 2014-06-19 DIAGNOSIS — N184 Chronic kidney disease, stage 4 (severe): Secondary | ICD-10-CM

## 2014-06-19 NOTE — Progress Notes (Signed)
Clinical Summary Brenda Lee is a 64 y.o.female seen today for follow up of the following medical problems.  1. Chronic diastolic heart failure - echo shows LVEF 60-65%, grade II diastolic dysfunction, she also has some mild mitral stenosis (mean grad 5) and pulmonary HTN (PASP 74), though RV function is normal. - denies any significant SOB. No orthopnea Still with some LE edema but better than before. Weights stable at home around 175 lbs - limiting salt intake. Currently not requiring diuretics.     2. CKD - followed by nephrlogogy, discharge Cr 5.38 and GFR 8  3. Pulmonary HTN elevated PASP on echo at 74. Likely multifactorial including grade II diastolic dysfunction with active fluid overload, mild to moderate gradient across MV consistent with mild to mod mitral stenosis. - stable LE edema, no significant SOB  4. Afib - new diagnosis during recent admission - on metoprolol for rate control. Severe anemia during admission requiring transfusion, she has not been started on anticoag.  - denies any palpitations, checking rates regularly typically in 70s. No lightheadedness or dizziness.   5. Anemia - transfused 2 units pRBCs during recent admit and received IV iron. Discharge Hgb 9.2. Followed by renal and pcp.   6. HTN - checking bp at home daily - typically 11140s/80. - compliant with meds  Past Medical History  Diagnosis Date  . Diabetes mellitus without complication   . Hypertension   . Anemia   . CKD (chronic kidney disease)   . CHF (congestive heart failure)   . Renal insufficiency   . Acute kidney injury 06/08/2014  . Acute on chronic diastolic heart failure [id number]  . Pulmonary hypertension 06/12/2014  . Atrial fibrillation with RVR 06/11/2014     Allergies  Allergen Reactions  . Morphine And Related Nausea And Vomiting     Current Outpatient Prescriptions  Medication Sig Dispense Refill  . aspirin EC 81 MG tablet Take 81 mg by mouth daily.    Marland Kitchen  atorvastatin (LIPITOR) 10 MG tablet Take 10 mg by mouth at bedtime.    . cefUROXime (CEFTIN) 500 MG tablet Take 1 tablet (500 mg total) by mouth 2 (two) times daily with a meal. Antibiotic to be taken for 4 more days. 8 tablet 0  . clonazePAM (KLONOPIN) 0.5 MG tablet Take 0.5 mg by mouth 2 (two) times daily as needed for anxiety.   2  . diltiazem (CARDIZEM CD) 180 MG 24 hr capsule Take 1 capsule (180 mg total) by mouth daily. New medication for your blood pressure and heart rate. 30 capsule 3  . ferrous sulfate 325 (65 FE) MG EC tablet Take 1 tablet (325 mg total) by mouth daily. Iron supplement. Take with food.    . fexofenadine (ALLEGRA) 180 MG tablet Take 180 mg by mouth daily.  0  . fluticasone (FLONASE) 50 MCG/ACT nasal spray Place 2 sprays into both nostrils daily. 16 g 2  . glimepiride (AMARYL) 2 MG tablet Take one-half tablet daily only if your blood sugar is greater than 160. Check your blood sugar daily.    . metoprolol (LOPRESSOR) 50 MG tablet Take 1 tablet (50 mg total) by mouth 2 (two) times daily. New medication for your blood pressure and heart rate. 60 tablet 3  . omeprazole (PRILOSEC) 20 MG capsule Take 1 capsule (20 mg total) by mouth daily. For gastric acid reflux. 30 capsule 1.  . sodium bicarbonate 650 MG tablet Take 1 tablet (650 mg total) by mouth 2 (two)  times daily. Medication to replace your bicarbonate losses. 60 tablet 3  . sodium chloride (OCEAN) 0.65 % SOLN nasal spray Place 1 spray into both nostrils as needed for congestion. 480 mL 0  . Vitamin D, Ergocalciferol, (DRISDOL) 50000 UNITS CAPS capsule Take 1 capsule (50,000 Units total) by mouth every 7 (seven) days. 30 capsule 3   No current facility-administered medications for this visit.     No past surgical history on file.   Allergies  Allergen Reactions  . Morphine And Related Nausea And Vomiting      No family history on file.   Social History Brenda Lee reports that she has never smoked. She does  not have any smokeless tobacco history on file. Brenda Lee reports that she does not drink alcohol.   Review of Systems CONSTITUTIONAL: No weight loss, fever, chills, weakness or fatigue.  HEENT: Eyes: No visual loss, blurred vision, double vision or yellow sclerae.No hearing loss, sneezing, congestion, runny nose or sore throat.  SKIN: No rash or itching.  CARDIOVASCULAR: per HPI RESPIRATORY: No shortness of breath, cough or sputum.  GASTROINTESTINAL: No anorexia, nausea, vomiting or diarrhea. No abdominal pain or blood.  GENITOURINARY: No burning on urination, no polyuria NEUROLOGICAL: No headache, dizziness, syncope, paralysis, ataxia, numbness or tingling in the extremities. No change in bowel or bladder control.  MUSCULOSKELETAL: + leg swelling LYMPHATICS: No enlarged nodes. No history of splenectomy.  PSYCHIATRIC: No history of depression or anxiety.  ENDOCRINOLOGIC: No reports of sweating, cold or heat intolerance. No polyuria or polydipsia.  Marland Kitchen   Physical Examination p 74 bp 182/81 Wt 175 lbs BMI 28 Gen: resting comfortably, no acute distress HEENT: no scleral icterus, pupils equal round and reactive, no palptable cervical adenopathy,  CV: RRR, no m/r/g, no JVD Resp: Clear to auscultation bilaterally GI: abdomen is soft, non-tender, non-distended, normal bowel sounds, no hepatosplenomegaly MSK: extremities are warm, trace bilateral edema Skin: warm, no rash Neuro:  no focal deficits Psych: appropriate affect   Diagnostic Studies Jan 2016 Echo Study Conclusions  - Left ventricle: The cavity size was normal. Wall thickness was normal. Systolic function was normal. The estimated ejection fraction was in the range of 60% to 65%. Wall motion was normal; there were no regional wall motion abnormalities. Features are consistent with a pseudonormal left ventricular filling pattern, with concomitant abnormal relaxation and increased filling pressure (grade 2  diastolic dysfunction). - Aortic valve: Moderately calcified annulus. Trileaflet; moderately thickened leaflets. - Mitral valve: Mildly to moderately calcified annulus. Mildly thickened leaflets . The findings are consistent with mild to moderate stenosis. There was mild regurgitation. Mean gradient (D): 5 mm Hg. - Left atrium: The atrium was severely dilated. - Right ventricle: The cavity size was moderately dilated. It shares the apex with the LV. Systolic function was normal. RV TAPSe is 2.0 cm. - Right atrium: The atrium was moderately dilated. - Pulmonary arteries: Systolic pressure was severely increased. PA peak pressure: 74 mm Hg (S). - Technically difficult study.   06/2014 VQ scan Perfusion: Small subsegmental defect is noted and posterior segment of right lower lobe which matches abnormality described on ventilation exam. No other significant defects are noted.  IMPRESSION: Low probability of pulmonary embolus.   Assessment and Plan  1. Chronic diastolic hear failure - denies current symptoms, stable weights at home. Currently not requiring diuretics, trying to limit due to significant renal dysfunction.  2. CKD stage IV - per renal, will request there recent notes and labs   3.  Pulmonary HTN - once euvolemic would repeat echo to reevaluate PASP once left sided filling pressures decreased, if persistently elevated may warrant further investigation including RHC.  - ANA negative, VQ scan negative  4. Afib - continue rate control - significant anemia requriing recent transfusion, we have not started anticoag at this point. Continue to follow blood counts  5. HTN - elevated in clinic, she reports white coat HTN. Home numbers at goal per report - will provide bp log   F/u 4 weeks  Arnoldo Lenis, M.D.

## 2014-06-19 NOTE — Patient Instructions (Signed)
Your physician recommends that you schedule a follow-up appointment in: 4 weeks with Dr. Harl Bowie  Your physician recommends that you continue on your current medications as directed. Please refer to the Current Medication list given to you today.  Your physician has requested that you regularly monitor and record your blood pressure readings at home. Please use the same machine at the same time of day to check your readings and record them to bring to your follow-up visit.  WE WILL REQUEST LABS  Thank you for choosing [facility]!!

## 2014-07-03 ENCOUNTER — Other Ambulatory Visit (HOSPITAL_COMMUNITY): Payer: Self-pay | Admitting: Nephrology

## 2014-07-03 DIAGNOSIS — N186 End stage renal disease: Secondary | ICD-10-CM

## 2014-07-03 DIAGNOSIS — Z992 Dependence on renal dialysis: Principal | ICD-10-CM

## 2014-07-06 ENCOUNTER — Encounter (HOSPITAL_COMMUNITY): Payer: Self-pay | Admitting: Pharmacy Technician

## 2014-07-06 ENCOUNTER — Other Ambulatory Visit: Payer: Self-pay | Admitting: Radiology

## 2014-07-07 ENCOUNTER — Other Ambulatory Visit (HOSPITAL_COMMUNITY): Payer: Self-pay | Admitting: Nephrology

## 2014-07-07 ENCOUNTER — Encounter (HOSPITAL_COMMUNITY): Payer: Self-pay

## 2014-07-07 ENCOUNTER — Ambulatory Visit (HOSPITAL_COMMUNITY)
Admission: RE | Admit: 2014-07-07 | Discharge: 2014-07-07 | Disposition: A | Discharge status: Home / Self Care | Payer: BLUE CROSS/BLUE SHIELD | Source: Ambulatory Visit | Attending: Nephrology | Admitting: Nephrology

## 2014-07-07 DIAGNOSIS — N179 Acute kidney failure, unspecified: Secondary | ICD-10-CM | POA: Diagnosis not present

## 2014-07-07 DIAGNOSIS — N186 End stage renal disease: Secondary | ICD-10-CM | POA: Insufficient documentation

## 2014-07-07 DIAGNOSIS — I272 Other secondary pulmonary hypertension: Secondary | ICD-10-CM | POA: Insufficient documentation

## 2014-07-07 DIAGNOSIS — I5033 Acute on chronic diastolic (congestive) heart failure: Secondary | ICD-10-CM | POA: Insufficient documentation

## 2014-07-07 DIAGNOSIS — I120 Hypertensive chronic kidney disease with stage 5 chronic kidney disease or end stage renal disease: Secondary | ICD-10-CM | POA: Diagnosis present

## 2014-07-07 DIAGNOSIS — Z7982 Long term (current) use of aspirin: Secondary | ICD-10-CM | POA: Insufficient documentation

## 2014-07-07 DIAGNOSIS — Z79899 Other long term (current) drug therapy: Secondary | ICD-10-CM | POA: Diagnosis not present

## 2014-07-07 DIAGNOSIS — E119 Type 2 diabetes mellitus without complications: Secondary | ICD-10-CM | POA: Insufficient documentation

## 2014-07-07 DIAGNOSIS — I4891 Unspecified atrial fibrillation: Secondary | ICD-10-CM | POA: Insufficient documentation

## 2014-07-07 DIAGNOSIS — Z992 Dependence on renal dialysis: Principal | ICD-10-CM

## 2014-07-07 HISTORY — PX: OTHER SURGICAL HISTORY: SHX169

## 2014-07-07 LAB — CBC WITH DIFFERENTIAL/PLATELET
BASOS ABS: 0.1 10*3/uL (ref 0.0–0.1)
BASOS PCT: 1 % (ref 0–1)
EOS ABS: 0.2 10*3/uL (ref 0.0–0.7)
EOS PCT: 3 % (ref 0–5)
HCT: 28 % — ABNORMAL LOW (ref 36.0–46.0)
Hemoglobin: 9.5 g/dL — ABNORMAL LOW (ref 12.0–15.0)
Lymphocytes Relative: 21 % (ref 12–46)
Lymphs Abs: 1.3 10*3/uL (ref 0.7–4.0)
MCH: 27.9 pg (ref 26.0–34.0)
MCHC: 33.9 g/dL (ref 30.0–36.0)
MCV: 82.1 fL (ref 78.0–100.0)
MONOS PCT: 5 % (ref 3–12)
Monocytes Absolute: 0.3 10*3/uL (ref 0.1–1.0)
Neutro Abs: 4.3 10*3/uL (ref 1.7–7.7)
Neutrophils Relative %: 70 % (ref 43–77)
Platelets: 202 10*3/uL (ref 150–400)
RBC: 3.41 MIL/uL — AB (ref 3.87–5.11)
RDW: 13.3 % (ref 11.5–15.5)
WBC: 6.1 10*3/uL (ref 4.0–10.5)

## 2014-07-07 LAB — PROTIME-INR
INR: 1.16 (ref 0.00–1.49)
Prothrombin Time: 14.9 seconds (ref 11.6–15.2)

## 2014-07-07 LAB — GLUCOSE, CAPILLARY: GLUCOSE-CAPILLARY: 143 mg/dL — AB (ref 70–99)

## 2014-07-07 MED ORDER — MIDAZOLAM HCL 2 MG/2ML IJ SOLN
INTRAMUSCULAR | Status: AC | PRN
  Administered 2014-07-07: 0.5 mg via INTRAVENOUS

## 2014-07-07 MED ORDER — CEFAZOLIN SODIUM-DEXTROSE 2-3 GM-% IV SOLR
2.0000 g | Freq: Once | INTRAVENOUS | Status: AC
  Administered 2014-07-07: 2 g via INTRAVENOUS

## 2014-07-07 MED ORDER — HEPARIN SODIUM (PORCINE) 1000 UNIT/ML IJ SOLN
INTRAMUSCULAR | Status: AC
  Filled 2014-07-07: qty 1

## 2014-07-07 MED ORDER — GELATIN ABSORBABLE 12-7 MM EX MISC
CUTANEOUS | Status: AC
  Filled 2014-07-07: qty 1

## 2014-07-07 MED ORDER — FENTANYL CITRATE 0.05 MG/ML IJ SOLN
INTRAMUSCULAR | Status: AC
  Filled 2014-07-07: qty 2

## 2014-07-07 MED ORDER — FENTANYL CITRATE 0.05 MG/ML IJ SOLN
INTRAMUSCULAR | Status: AC | PRN
  Administered 2014-07-07: 25 ug via INTRAVENOUS

## 2014-07-07 MED ORDER — CEFAZOLIN SODIUM-DEXTROSE 2-3 GM-% IV SOLR
INTRAVENOUS | Status: AC
  Administered 2014-07-07: 2 g via INTRAVENOUS
  Filled 2014-07-07: qty 50

## 2014-07-07 MED ORDER — MIDAZOLAM HCL 2 MG/2ML IJ SOLN
INTRAMUSCULAR | Status: AC
Start: 2014-07-07 — End: 2014-07-07
  Filled 2014-07-07: qty 2

## 2014-07-07 MED ORDER — LIDOCAINE HCL (PF) 1 % IJ SOLN
INTRAMUSCULAR | Status: AC
  Filled 2014-07-07: qty 30

## 2014-07-07 NOTE — Discharge Instructions (Signed)
Tunneled Catheter Insertion, Care After °Refer to this sheet in the next few weeks. These instructions provide you with information on caring for yourself after your procedure. Your caregiver may also give you more specific instructions. Your treatment has been planned according to current medical practices, but problems sometimes occur. Call your caregiver if you have any problems or questions after your procedure.  °HOME CARE INSTRUCTIONS °· Rest at home the day of the procedure. You will likely be able to return to normal activities the following day. °· Follow your caregiver's specific instructions for the type of device that you have. °· Only take over-the-counter or prescription medicines as directed by your caregiver. °· Keep the insertion site of the catheter clean and dry at all times. °¨ Change the bandages (dressings) over the catheter site as directed by your caregiver. °¨ Wash the area around the catheter site during each dressing change. Sponge bathe the area using a germ-killing (antiseptic) solution as directed by your caregiver. °¨ Look for redness or swelling at the insertion site during each dressing change. °· Apply an antibiotic ointment as directed by your caregiver. °· Flush your catheter as directed to keep it from becoming clogged. °· Always wash your hands thoroughly before changing dressings or flushing the catheter. °· Do not let air enter the catheter. °¨ Never open the cap at the catheter tip. °¨ Always make sure there is no air in the syringe or in the tubing for infusions.    °· Do not lift anything heavy. °· Do not drive until your caregiver approves. °· Do not shower or bathe until your caregiver approves. When you shower or bathe, place a piece of plastic wrap over the catheter site. Do not allow the catheter site or the dressing to get wet. If taking a bath, do not allow the catheter to get submerged in the water. °If the catheter was inserted through an arm vein:  °· Avoid  wearing tight clothes or jewelry on the arm that has the catheter.   °· Do not sleep with your head on the arm that has the catheter.   °· Do not allow use of a blood pressure cuff on the arm that has the catheter.   °· Do not let anyone draw blood from the arm that has the catheter, except through the catheter itself. °SEEK MEDICAL CARE IF: °· You have bleeding at the insertion site of the catheter.   °· You feel weak or nauseous.   °· Your catheter is not working properly.   °· You have redness, pain, swelling, and warmth at the insertion site.   °· You notice fluid draining from the insertion site.   °SEEK IMMEDIATE MEDICAL CARE IF: °· Your catheter breaks or has a hole in it.   °· Your catheter comes loose or gets pulled completely out. If this happens, hold firm pressure over the area with your hand or a clean cloth.   °· You have a fever. °· You have chills.   °· Your catheter becomes totally blocked.   °· You have swelling in your arm, shoulder, neck, or face.   °· You have bleeding from the insertion site that does not stop.   °· You develop chest pain or have trouble breathing.   °· You feel dizzy or faint.   °MAKE SURE YOU: °· Understand these instructions. °· Will watch your condition. °· Will get help right away if you are not doing well or get worse. °Document Released: 05/08/2012 Document Revised: 01/22/2013 Document Reviewed: 05/08/2012 °[employer]® Patient Information ©2015 [employer]. This information is not intended to replace advice   advice given to you by your health care provider. Make sure you discuss any questions you have with your health care provider.

## 2014-07-07 NOTE — Procedures (Signed)
Placement of right jugular HD catheter.  Tip in upper right atrium.  No immediate complication.

## 2014-07-07 NOTE — Sedation Documentation (Signed)
Patient denies pain and is resting comfortably.  

## 2014-07-07 NOTE — H&P (Signed)
Chief Complaint: Worsening renal function  Referring Physician(s): Befekadu,Belayenh S  History of Present Illness: Brenda Lee is a 64 y.o. 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 Diabetes mellitus without complication   . Hypertension   . Anemia   . CKD (chronic kidney disease)   . CHF (congestive heart failure)   . Renal insufficiency   . Acute kidney injury 06/08/2014  . Acute on chronic diastolic heart failure [id number]  . Pulmonary hypertension 06/12/2014  . Atrial fibrillation with RVR 06/11/2014    History reviewed. No pertinent past surgical history.  Allergies: Morphine and related  Medications: Prior to Admission medications   Medication Sig Start Date End Date Taking? Authorizing Provider  aspirin EC 81 MG tablet Take 81 mg by mouth daily.   Yes Historical Provider, MD  atorvastatin (LIPITOR) 10 MG tablet Take 10 mg by mouth at bedtime. 04/21/14  Yes Historical Provider, MD  calcium acetate (PHOSLO) 667 MG capsule Take 667 mg by mouth 3 (three) times daily with meals.   Yes Historical Provider, MD  diltiazem (CARDIZEM CD) 180 MG 24 hr capsule Take 1 capsule (180 mg total) by mouth daily. New medication for your blood pressure and heart rate. 06/14/14  Yes Rexene Alberts, MD  ferrous sulfate 325 (65 FE) MG EC tablet Take 1 tablet (325 mg total) by mouth daily. Iron supplement. Take with food. 06/14/14  Yes Rexene Alberts, MD  fexofenadine (ALLEGRA) 180 MG tablet Take 180 mg by mouth daily. 05/01/14  Yes Historical Provider, MD  fluticasone (FLONASE) 50 MCG/ACT nasal spray Place 2 sprays into both nostrils daily. Patient taking differently: Place 2 sprays into both nostrils daily as needed for allergies.  04/25/14  Yes Merryl Hacker, MD  glimepiride (AMARYL)  2 MG tablet Take one-half tablet daily only if your blood sugar is greater than 160. Check your blood sugar daily. Patient taking differently: Take 1 mg by mouth daily as needed (if blood sugar is greater than 160). Check blood sugar daily. 06/14/14  Yes Rexene Alberts, MD  metoprolol (LOPRESSOR) 50 MG tablet Take 1 tablet (50 mg total) by mouth 2 (two) times daily. New medication for your blood pressure and heart rate. 06/14/14  Yes Rexene Alberts, MD  omeprazole (PRILOSEC) 20 MG capsule Take 1 capsule (20 mg total) by mouth daily. For gastric acid reflux. 06/14/14  Yes Rexene Alberts, MD  sodium bicarbonate 650 MG tablet Take 1 tablet (650 mg total) by mouth 2 (two) times daily. Medication to replace your bicarbonate losses. 06/14/14  Yes Rexene Alberts, MD  sodium chloride (OCEAN) 0.65 % SOLN nasal spray Place 1 spray into both nostrils as needed for congestion. 04/25/14  Yes Merryl Hacker, MD  Vitamin D, Ergocalciferol, (DRISDOL) 50000 UNITS CAPS capsule Take 1 capsule (50,000 Units total) by mouth every 7 (seven) days. Patient taking differently: Take 50,000 Units by mouth every 7 (seven) days. On Thursday. 06/14/14  Yes Rexene Alberts, MD    History reviewed. No pertinent family history.  History   Social History  . Marital Status: Married    Spouse Name: N/A    Number of Children: N/A  . Years of Education: N/A   Social History Main Topics  . Smoking status: Never Smoker   . Smokeless tobacco: None  . Alcohol  Use: No  . Drug Use: None  . Sexual Activity: None   Other Topics Concern  . None   Social History Narrative    Review of Systems: A 12 point ROS discussed and pertinent positives are indicated in the HPI above.  All other systems are negative.  Review of Systems  Constitutional: Positive for fatigue. Negative for fever, activity change and appetite change.  Respiratory: Positive for shortness of breath. Negative for cough.   Gastrointestinal: Negative for abdominal pain.   Musculoskeletal: Negative for back pain.  Neurological: Positive for weakness.  Psychiatric/Behavioral: Negative for behavioral problems and confusion.     Vital Signs: BP 217/90 mmHg  Pulse 71  Temp(Src) 97 F (36.1 C) (Oral)  Resp 20  Ht 5\' 7"  (1.702 m)  Wt 81.647 kg (180 lb)  BMI 28.19 kg/m2  SpO2 99%  Physical Exam  Constitutional: She is oriented to person, place, and time. She appears well-nourished.  Cardiovascular: Normal rate, regular rhythm and normal heart sounds.   No murmur heard. Pulmonary/Chest: Effort normal and breath sounds normal. She has no wheezes.  Abdominal: Soft. Bowel sounds are normal.  Musculoskeletal: Normal range of motion.  Neurological: She is alert and oriented to person, place, and time.  Skin: Skin is warm and dry.  Psychiatric: She has a normal mood and affect. Her behavior is normal. Judgment and thought content normal.  Nursing note and vitals reviewed.   Imaging: Dg Chest 1 View  06/11/2014   CLINICAL DATA:  Cough and hypertension, history of atrial fibrillation  EXAM: CHEST - 1 VIEW  COMPARISON:  06/10/2014  FINDINGS: Cardiac shadow is stable. Poor inspiratory effort is again noted although the vascular congestion has resolved in the interval from the prior exam. No focal infiltrate is seen. No bony abnormality is noted.  IMPRESSION: Resolution of previously seen congestive failure.   Electronically Signed   By: Inez Catalina M.D.   On: 06/11/2014 15:05   Dg Chest 1 View  06/10/2014   CLINICAL DATA:  Productive cough for 3 weeks. Syncope and sudden onset of shortness of breath. Initial encounter.  EXAM: CHEST - 1 VIEW  COMPARISON:  Chest radiograph from 06/07/2014  FINDINGS: The lungs are mildly hypoexpanded. New vascular congestion is noted, with increased interstitial markings, concerning for pulmonary edema. No definite pleural effusion or pneumothorax is seen.  The cardiomediastinal silhouette is mildly enlarged. No acute osseous  abnormalities are identified.  IMPRESSION: Lungs mildly hypoexpanded. New vascular congestion and mild cardiomegaly, with increased interstitial markings, concerning for pulmonary edema.   Electronically Signed   By: Garald Balding M.D.   On: 06/10/2014 02:57   Dg Chest 2 View  06/07/2014   CLINICAL DATA:  Productive cough for 3 weeks. Vomiting. Near syncope tonight. Multiple falls over the last few days.  EXAM: CHEST  2 VIEW  COMPARISON:  04/25/2014  FINDINGS: Shallow inspiration with elevation of the right hemidiaphragm. Normal heart size and pulmonary vascularity. No focal airspace disease or consolidation in the lungs. No blunting of costophrenic angles. No pneumothorax. Mediastinal contours appear intact. Degenerative changes in the spine. Calcified and tortuous aorta.  IMPRESSION: No active cardiopulmonary disease.   Electronically Signed   By: Lucienne Capers M.D.   On: 06/07/2014 22:11   Ct Head Wo Contrast  06/07/2014   CLINICAL DATA:  Near syncope tonight. Symptoms are exacerbated when bending forward to. Multiple falls over the past few days. Facial bruising.  EXAM: CT HEAD WITHOUT CONTRAST  TECHNIQUE: Contiguous axial  images were obtained from the base of the skull through the vertex without intravenous contrast.  COMPARISON:  None.  FINDINGS: Diffuse cerebral atrophy. Mild ventricular dilatation consistent with central atrophy. Patchy periventricular changes in the deep white matter consistent with small vessel ischemia. No mass effect or midline shift. No abnormal extra-axial fluid collections. Gray-white matter junctions are distinct. Basal cisterns are not effaced. No evidence of acute intracranial hemorrhage. No depressed skull fractures. Visualized paranasal sinuses and mastoid air cells are not opacified. Vascular calcifications.  IMPRESSION: No acute intracranial abnormalities. Chronic atrophy and small vessel ischemic changes.   Electronically Signed   By: Lucienne Capers M.D.   On:  06/07/2014 22:13   US Renal  06/08/2014   CLINICAL DATA:  Acute on chronic renal failure. Nephrolithiasis. Diabetes and hypertension.  EXAM: RENAL/URINARY TRACT ULTRASOUND COMPLETE  COMPARISON:  None.  FINDINGS: Right Kidney:  Length: 12.8 cm. Diffusely increased renal parenchymal echogenicity. No mass or hydronephrosis visualized.  Left Kidney:  Length: 12.2 cm. Diffusely increased renal parenchymal echogenicity. No mass or hydronephrosis visualized.  Bladder:  Nearly completely empty, and therefore not well visualized.  IMPRESSION: No evidence of hydronephrosis.  Diffusely increased renal parenchymal echogenicity, consistent with medical renal disease.   Electronically Signed   By: Earle Gell M.D.   On: 06/08/2014 13:16   Nm Pulmonary Perf And Vent  06/11/2014   CLINICAL DATA:  Shortness of breath.  EXAM: NUCLEAR MEDICINE VENTILATION - PERFUSION LUNG SCAN  TECHNIQUE: Ventilation images were obtained in multiple projections using inhaled aerosol technetium 99 M DTPA. Perfusion images were obtained in multiple projections after intravenous injection of Tc-72m MAA.  RADIOPHARMACEUTICALS:  40.0 mCi Tc-13m DTPA aerosol and 6.0 mCi Tc-33m MAA  COMPARISON:  Chest radiograph of same day.  FINDINGS: Ventilation: Small subsegmental defect is noted in the posterior segment of the right lower lobe.  Perfusion: Small subsegmental defect is noted and posterior segment of right lower lobe which matches abnormality described on ventilation exam. No other significant defects are noted.  IMPRESSION: Low probability of pulmonary embolus.   Electronically Signed   By: Sabino Dick M.D.   On: 06/11/2014 15:07    Labs:  CBC:  Recent Labs  06/11/14 1920 06/12/14 0613 06/13/14 0614 06/14/14 0550  WBC 5.6 5.9 5.5 5.9  HGB 9.2* 8.9* 9.4* 9.2*  HCT 27.2* 26.7* 27.7* 27.2*  PLT 238 222 236 219    COAGS: No results for input(s): INR, APTT in the last 8760 hours.  BMP:  Recent Labs  06/11/14 0601 06/12/14 0613  06/13/14 0614 06/14/14 0550  NA 137 135 135 139  K 4.1 3.3* 3.5 4.0  CL 105 99 101 106  CO2 22 23 23 23   GLUCOSE 139* 128* 133* 176*  BUN 79* 82* 72* 63*  CALCIUM 7.7* 7.6* 8.0* 8.3*  CREATININE 6.91* 6.58* 5.87* 5.38*  GFRNONAA 6* 6* 7* 8*  GFRAA 7* 7* 8* 9*    LIVER FUNCTION TESTS: No results for input(s): BILITOT, AST, ALT, ALKPHOS, PROT, ALBUMIN in the last 8760 hours.  TUMOR MARKERS: No results for input(s): AFPTM, CEA, CA199, CHROMGRNA in the last 8760 hours.  Assessment and Plan:  Worsening renal function Acute on chronic renal failure Now scheduled for HD catheter--initiate dialysis tomorrow Pt aware of procedure benefits and risks including but not limited to: Infection; vessel damage Agreeable to proceed Consent signed andin chart  Thank you for this interesting consult.  I greatly enjoyed meeting Brenda Lee and look forward to participating  in their care.  Signed: Amberia Bayless A 07/07/2014, 10:22 AM   I spent a total of 20 minutes face to face in clinical consultation, greater than 50% of which was counseling/coordinating care for hemo dialysis catheter placement

## 2014-07-14 ENCOUNTER — Other Ambulatory Visit: Payer: Self-pay | Admitting: *Deleted

## 2014-07-14 DIAGNOSIS — N185 Chronic kidney disease, stage 5: Secondary | ICD-10-CM

## 2014-07-14 DIAGNOSIS — Z01810 Encounter for preprocedural cardiovascular examination: Secondary | ICD-10-CM

## 2014-07-31 ENCOUNTER — Encounter: Payer: Self-pay | Admitting: Surgery

## 2014-08-03 ENCOUNTER — Ambulatory Visit (INDEPENDENT_AMBULATORY_CARE_PROVIDER_SITE_OTHER): Payer: BLUE CROSS/BLUE SHIELD | Admitting: Cardiology

## 2014-08-03 ENCOUNTER — Other Ambulatory Visit: Payer: Self-pay | Admitting: Surgery

## 2014-08-03 ENCOUNTER — Encounter: Payer: Self-pay | Admitting: Cardiology

## 2014-08-03 ENCOUNTER — Encounter: Payer: Self-pay | Admitting: Surgery

## 2014-08-03 ENCOUNTER — Ambulatory Visit (INDEPENDENT_AMBULATORY_CARE_PROVIDER_SITE_OTHER): Payer: BLUE CROSS/BLUE SHIELD | Admitting: Surgery

## 2014-08-03 ENCOUNTER — Ambulatory Visit (HOSPITAL_COMMUNITY)
Admission: RE | Admit: 2014-08-03 | Discharge: 2014-08-03 | Disposition: A | Discharge status: Home / Self Care | Payer: BLUE CROSS/BLUE SHIELD | Source: Ambulatory Visit | Attending: Surgery | Admitting: Surgery

## 2014-08-03 VITALS — BP 212/89 | HR 79 | Resp 16 | Ht 67.0 in | Wt 177.0 lb

## 2014-08-03 VITALS — BP 190/79 | HR 83 | Ht 67.0 in | Wt 177.0 lb

## 2014-08-03 DIAGNOSIS — I10 Essential (primary) hypertension: Secondary | ICD-10-CM

## 2014-08-03 DIAGNOSIS — N186 End stage renal disease: Secondary | ICD-10-CM

## 2014-08-03 DIAGNOSIS — I4891 Unspecified atrial fibrillation: Secondary | ICD-10-CM

## 2014-08-03 DIAGNOSIS — N185 Chronic kidney disease, stage 5: Secondary | ICD-10-CM

## 2014-08-03 DIAGNOSIS — Z01810 Encounter for preprocedural cardiovascular examination: Secondary | ICD-10-CM

## 2014-08-03 DIAGNOSIS — I272 Pulmonary hypertension, unspecified: Secondary | ICD-10-CM

## 2014-08-03 DIAGNOSIS — I270 Primary pulmonary hypertension: Secondary | ICD-10-CM

## 2014-08-03 DIAGNOSIS — I5032 Chronic diastolic (congestive) heart failure: Secondary | ICD-10-CM

## 2014-08-03 NOTE — Progress Notes (Signed)
Clinical Summary Ms. Schuttler is a 64 y.o.female seen today for follow up of the following medical problems.   1. Chronic diastolic heart failure - echo shows LVEF 60-65%, grade II diastolic dysfunction, she also has some mild mitral stenosis (mean grad 5) and pulmonary HTN (PASP 74), though RV function is normal. - denies any breathing issues. Weight is stable at 177 lbs. Decreaed LE edema since starting dialysis.    2. ESRD - followed by nephrlogogy - recently had HD catheter placed and started regular HD   3. Pulmonary HTN elevated PASP on echo at 74. Likely multifactorial including grade II diastolic dysfunction with active fluid overload, mild to moderate gradient across MV consistent with mild to mod mitral stenosis. - stable LE edema, no significant SOB  4. Afib - new diagnosis during recent admission - on metoprolol for rate control. Severe anemia during admission requiring transfusion, she has not been started on anticoag.   - denies any palpitations since our last visit.   5. Anemia - transfused 2 units pRBCs during recent admit and received IV iron. Discharge Hgb 9.2. Followed by renal and pcp.  - Hgb stable last labs at 9.5 since discharge.   6. HTN - checking bp at home daily. Typically 130s/80s at home. Does have some white coat HTN.  - compliant with meds Past Medical History  Diagnosis Date  . Diabetes mellitus without complication   . Hypertension   . Anemia   . CKD (chronic kidney disease)   . CHF (congestive heart failure)   . Renal insufficiency   . Acute kidney injury 06/08/2014  . Acute on chronic diastolic heart failure [id number]  . Pulmonary hypertension 06/12/2014  . Atrial fibrillation with RVR 06/11/2014     Allergies  Allergen Reactions  . Morphine And Related Nausea And Vomiting     Current Outpatient Prescriptions  Medication Sig Dispense Refill  . aspirin EC 81 MG tablet Take 81 mg by mouth daily.    Marland Kitchen atorvastatin (LIPITOR) 10 MG  tablet Take 10 mg by mouth at bedtime.    . calcium acetate (PHOSLO) 667 MG capsule Take 667 mg by mouth 3 (three) times daily with meals.    Marland Kitchen diltiazem (CARDIZEM CD) 180 MG 24 hr capsule Take 1 capsule (180 mg total) by mouth daily. New medication for your blood pressure and heart rate. 30 capsule 3  . ferrous sulfate 325 (65 FE) MG EC tablet Take 1 tablet (325 mg total) by mouth daily. Iron supplement. Take with food.    . fexofenadine (ALLEGRA) 180 MG tablet Take 180 mg by mouth daily.  0  . fluticasone (FLONASE) 50 MCG/ACT nasal spray Place 2 sprays into both nostrils daily. (Patient taking differently: Place 2 sprays into both nostrils daily as needed for allergies. ) 16 g 2  . glimepiride (AMARYL) 2 MG tablet Take one-half tablet daily only if your blood sugar is greater than 160. Check your blood sugar daily. (Patient taking differently: Take 1 mg by mouth daily as needed (if blood sugar is greater than 160). Check blood sugar daily.)    . metoprolol (LOPRESSOR) 50 MG tablet Take 1 tablet (50 mg total) by mouth 2 (two) times daily. New medication for your blood pressure and heart rate. 60 tablet 3  . omeprazole (PRILOSEC) 20 MG capsule Take 1 capsule (20 mg total) by mouth daily. For gastric acid reflux. 30 capsule 1.  . sodium bicarbonate 650 MG tablet Take 1 tablet (650 mg  total) by mouth 2 (two) times daily. Medication to replace your bicarbonate losses. 60 tablet 3  . sodium chloride (OCEAN) 0.65 % SOLN nasal spray Place 1 spray into both nostrils as needed for congestion. 480 mL 0  . Vitamin D, Ergocalciferol, (DRISDOL) 50000 UNITS CAPS capsule Take 1 capsule (50,000 Units total) by mouth every 7 (seven) days. (Patient taking differently: Take 50,000 Units by mouth every 7 (seven) days. On Thursday.) 30 capsule 3   No current facility-administered medications for this visit.     No past surgical history on file.   Allergies  Allergen Reactions  . Morphine And Related Nausea And  Vomiting      No family history on file.   Social History Ms. Zora reports that she has never smoked. She does not have any smokeless tobacco history on file. Ms. Cluff reports that she does not drink alcohol.   Review of Systems CONSTITUTIONAL: No weight loss, fever, chills, weakness or fatigue.  HEENT: Eyes: No visual loss, blurred vision, double vision or yellow sclerae.No hearing loss, sneezing, congestion, runny nose or sore throat.  SKIN: No rash or itching.  CARDIOVASCULAR: per HPI RESPIRATORY: No shortness of breath, cough or sputum.  GASTROINTESTINAL: No anorexia, nausea, vomiting or diarrhea. No abdominal pain or blood.  GENITOURINARY: No burning on urination, no polyuria NEUROLOGICAL: No headache, dizziness, syncope, paralysis, ataxia, numbness or tingling in the extremities. No change in bowel or bladder control.  MUSCULOSKELETAL: No muscle, back pain, joint pain or stiffness.  LYMPHATICS: No enlarged nodes. No history of splenectomy.  PSYCHIATRIC: No history of depression or anxiety.  ENDOCRINOLOGIC: No reports of sweating, cold or heat intolerance. No polyuria or polydipsia.  Marland Kitchen   Physical Examination p 83 bp 190/79 Wt 177 lbs BMI 28 Gen: resting comfortably, no acute distress HEENT: no scleral icterus, pupils equal round and reactive, no palptable cervical adenopathy,  CV: RRR, no m/r/g, no JVD Resp: Clear to auscultation bilaterally GI: abdomen is soft, non-tender, non-distended, normal bowel sounds, no hepatosplenomegaly MSK: extremities are warm, 1+ bilateral edema Skin: warm, no rash Neuro:  no focal deficits Psych: appropriate affect   Diagnostic Studies Jan 2016 Echo Study Conclusions  - Left ventricle: The cavity size was normal. Wall thickness was normal. Systolic function was normal. The estimated ejection fraction was in the range of 60% to 65%. Wall motion was normal; there were no regional wall motion abnormalities. Features  are consistent with a pseudonormal left ventricular filling pattern, with concomitant abnormal relaxation and increased filling pressure (grade 2 diastolic dysfunction). - Aortic valve: Moderately calcified annulus. Trileaflet; moderately thickened leaflets. - Mitral valve: Mildly to moderately calcified annulus. Mildly thickened leaflets . The findings are consistent with mild to moderate stenosis. There was mild regurgitation. Mean gradient (D): 5 mm Hg. - Left atrium: The atrium was severely dilated. - Right ventricle: The cavity size was moderately dilated. It shares the apex with the LV. Systolic function was normal. RV TAPSe is 2.0 cm. - Right atrium: The atrium was moderately dilated. - Pulmonary arteries: Systolic pressure was severely increased. PA peak pressure: 74 mm Hg (S). - Technically difficult study.   06/2014 VQ scan Perfusion: Small subsegmental defect is noted and posterior segment of right lower lobe which matches abnormality described on ventilation exam. No other significant defects are noted.  IMPRESSION: Low probability of pulmonary embolus.    Assessment and Plan  1. Chronic diastolic hear failure - denies current symptoms, fluid removal priamily by HD though she does  make some urine - continue current meds  2. ESRD - HD per renal   3. Pulmonary HTN - will repeat echo, initial study in setting of severe volume overload. Now that more euvolemic will reevaluate PASP.  - ANA negative, VQ scan negative  4. Afib - continue rate control - significant anemia requriing recent transfusion during recent admission. Hgb has been stable since discharge, discussed starting anticoag. She requests time to think about it and will contact our office. Will also need to coordinate with possible upcoming fistula procedure if she decides to start.   5. HTN - elevated in clinic, she reports white coat HTN. Home numbers at goal per report - will  provide bp log to bring next visit   F/u 6 weeks       Arnoldo Lenis, M.D.

## 2014-08-03 NOTE — Progress Notes (Signed)
Patient name: Brenda Lee MRN: [id number] DOB: [birth year] Sex: female   Referred by: Lowanda Foster  Reason for referral:  Chief Complaint  Patient presents with  . New Evaluation    Ref. by  Dr. Lowanda Foster  C/O  CKD  stage V Eval. for access  Pt. HD: Tue-Thur-Sat.     HISTORY OF PRESENT ILLNESS: This is a very pleasant 64 year old female who started dialysis 3 weeks ago.  She has a right-sided catheter.  She is right-handed.  She dialyzes on Tuesday Thursday Saturday.  The patient's renal failure secondary to hypertension and diabetes.  Her diabetes has been well controlled.  Her most recent hemoglobin A1c was in the 5 range.  She is on a statin for hypercholesterolemia.  She suffers from atrial fibrillation but is not yet on anticoagulation.  yPast Medical History  Diagnosis Date  . Diabetes mellitus without complication   . Hypertension   . Anemia   . CKD (chronic kidney disease)   . CHF (congestive heart failure)   . Renal insufficiency   . Acute kidney injury 06/08/2014  . Acute on chronic diastolic heart failure [id number]  . Pulmonary hypertension 06/12/2014  . Atrial fibrillation with RVR 06/11/2014    Past Surgical History  Procedure Laterality Date  . Jugular cath Right Feb. 2, 2016    HD    History   Social History  . Marital Status: Married    Spouse Name: N/A  . Number of Children: N/A  . Years of Education: N/A   Occupational History  . Not on file.   Social History Main Topics  . Smoking status: Never Smoker   . Smokeless tobacco: Never Used  . Alcohol Use: No  . Drug Use: No  . Sexual Activity: Not on file   Other Topics Concern  . Not on file   Social History Narrative    Family History  Problem Relation Age of Onset  . Aneurysm Mother     She fell and Hit head  . Diabetes Father   . Heart attack Father   . Heart disease Father     Before age 43    Allergies as of 08/03/2014 - Review Complete 08/03/2014  Allergen Reaction Noted  .  Morphine and related Nausea And Vomiting 04/25/2014    Current Outpatient Prescriptions on File Prior to Visit  Medication Sig Dispense Refill  . aspirin EC 81 MG tablet Take 81 mg by mouth daily.    Marland Kitchen atorvastatin (LIPITOR) 10 MG tablet Take 10 mg by mouth at bedtime.    . calcium acetate (PHOSLO) 667 MG capsule Take 667 mg by mouth 3 (three) times daily with meals.    Marland Kitchen diltiazem (CARDIZEM CD) 180 MG 24 hr capsule Take 1 capsule (180 mg total) by mouth daily. New medication for your blood pressure and heart rate. 30 capsule 3  . fexofenadine (ALLEGRA) 180 MG tablet Take 180 mg by mouth daily.  0  . fluticasone (FLONASE) 50 MCG/ACT nasal spray Place 2 sprays into both nostrils daily. (Patient taking differently: Place 2 sprays into both nostrils daily as needed for allergies. ) 16 g 2  . glimepiride (AMARYL) 2 MG tablet Take one-half tablet daily only if your blood sugar is greater than 160. Check your blood sugar daily. (Patient taking differently: Take 1 mg by mouth daily as needed (if blood sugar is greater than 160). Check blood sugar daily.)    . metoprolol (LOPRESSOR) 50 MG tablet Take 1  tablet (50 mg total) by mouth 2 (two) times daily. New medication for your blood pressure and heart rate. 60 tablet 3  . omeprazole (PRILOSEC) 20 MG capsule Take 1 capsule (20 mg total) by mouth daily. For gastric acid reflux. 30 capsule 1.  . sodium chloride (OCEAN) 0.65 % SOLN nasal spray Place 1 spray into both nostrils as needed for congestion. 480 mL 0  . Vitamin D, Ergocalciferol, (DRISDOL) 50000 UNITS CAPS capsule Take 1 capsule (50,000 Units total) by mouth every 7 (seven) days. (Patient taking differently: Take 50,000 Units by mouth every 7 (seven) days. On Thursday.) 30 capsule 3  . zolpidem (AMBIEN) 5 MG tablet Take 5 mg by mouth at bedtime.  0   No current facility-administered medications on file prior to visit.     REVIEW OF SYSTEMS: Cardiovascular: No chest pain, chest pressure,  palpitations, orthopnea, or dyspnea on exertion. No claudication or rest pain,  No history of DVT or phlebitis.positive for leg swelling  Pulmonary: No productive cough, asthma or wheezing. Neurologic: No weakness, paresthesias, aphasia, or amaurosis. No dizziness. Hematologic: No bleeding problems or clotting disorders. Musculoskeletal: No joint pain or joint swelling. Gastrointestinal: No blood in stool or hematemesis Genitourinary: No dysuria or hematuria. Psychiatric:: No history of major depression. Integumentary: No rashes or ulcers. Constitutional: No fever or chills.  PHYSICAL EXAMINATION: General: The patient appears their stated age.  Vital signs are BP 212/89 mmHg  Pulse 79  Resp 16  Ht 5\' 7"  (1.702 m)  Wt 177 lb (80.287 kg)  BMI 27.72 kg/m2  SpO2 100% HEENT:  No gross abnormalities Pulmonary: Respirations are non-labored Musculoskeletal: There are no major deformities.   Neurologic: No focal weakness or paresthesias are detected, Skin: There are no ulcer or rashes noted. Psychiatric: The patient has normal affect. Cardiovascular: There is a regular rate and rhythm without significant murmur appreciated.palpable radial brachial pulse on the left   Diagnostic Studies: Abnormal arterial study. Excellent left cephalic vein beginning at the wrist    Assessment:  End stage renal disease plan:  I discussed proceeding with a left radiocephalic fistula.  We discussed the risks of non-maturity and the need for future interventions.  We also discussed the risk of steal syndrome.  I am going to try to do this on Thursday so as to accommodate her schedule.  This may need to be done by one of my partners in order to get her taken care of as soon as possible.     Eldridge Abrahams, M.D. [facility] Office: [phone] Pager:  [phone]

## 2014-08-03 NOTE — Patient Instructions (Signed)
Your physician has requested that you have an echocardiogram. Echocardiography is a painless test that uses sound waves to create images of your heart. It provides your doctor with information about the size and shape of your heart and how well your heart's chambers and valves are working. This procedure takes approximately one hour. There are no restrictions for this procedure. Office will contact with results via phone or letter.   Continue all current medications. Please call office back in approximately 1 week to give update on your decision about starting Coumadin. Follow up in  6 weeks

## 2014-08-05 ENCOUNTER — Other Ambulatory Visit (INDEPENDENT_AMBULATORY_CARE_PROVIDER_SITE_OTHER): Payer: Medicare Other

## 2014-08-05 ENCOUNTER — Other Ambulatory Visit: Payer: Self-pay

## 2014-08-05 DIAGNOSIS — I5032 Chronic diastolic (congestive) heart failure: Secondary | ICD-10-CM | POA: Diagnosis not present

## 2014-08-05 DIAGNOSIS — I272 Pulmonary hypertension, unspecified: Secondary | ICD-10-CM

## 2014-08-05 DIAGNOSIS — I270 Primary pulmonary hypertension: Secondary | ICD-10-CM | POA: Diagnosis not present

## 2014-08-05 DIAGNOSIS — I4891 Unspecified atrial fibrillation: Secondary | ICD-10-CM

## 2014-08-10 ENCOUNTER — Telehealth: Payer: Self-pay | Admitting: *Deleted

## 2014-08-10 NOTE — Telephone Encounter (Signed)
LM for pt to call back. Forwarded to Dr. Woody Seller

## 2014-08-10 NOTE — Telephone Encounter (Signed)
-----   Message from Arnoldo Lenis, MD sent at 08/07/2014 12:33 PM EST ----- Echo shows the pressures in her heart have decreased some, likely to having more fluid removed with dialysis. Will discuss in more detail at next follow up  Zandra Abts MD

## 2014-08-11 ENCOUNTER — Other Ambulatory Visit: Payer: Self-pay

## 2014-09-04 NOTE — Progress Notes (Signed)
Unable to reach pt by phone for pre-op call. Left pre-op instructions on pt's voicemail, see pre-op checklist.

## 2014-09-04 NOTE — Progress Notes (Signed)
Anesthesia Chart Review:  Pt is 64 year old female scheduled for L arm AV fistula creation on 09/07/2014 with Dr. Oneida Alar.   Pt is same day work up.   PMH includes: HTN, CHF, atrial fibrillation, DM, anemia, ESRD on HD. Never smoker. BMI 27.   Medications include: diltiazem, metoprolol.   Pt hospitalized 1/3-1/03/2015 for syncope and collapse due to dehydration and orthostasis. Complicated by new onset atrial fibrillation, acute on chronic diastolic heart failure, acute respiratory failure, normocytic anemia s/p 2 units PRBCs and IV iron, hypokalemia.   EKG 06/11/2014: atrial fibrillation with RVR.    EKG was obtained during hospitalization as described above. Pt was in NSR at discharge. Saw Dr. Harl Bowie with cardiology for follow up 06/19/2014 and 08/03/2014. His notes indicate pt denied palpitations since discharge from hospital and his exam is documented at CV RRR. The 08/03/2014 visit notes indicate pt is going to think about anticoagulation now that her severe anemia was resolved. This is to be discussed at next follow up visit, currently scheduled for 09/15/2104. Dr. Nelly Laurence notes indicate he is aware of pt's upcoming surgery.   Echo 08/05/2014:  - Left ventricle: The cavity size was normal. Wall thickness wasincreased in a pattern of moderate LVH. Systolic function wasnormal. The estimated ejection fraction was in the range of 55%to 60%. Diastolic function is abnormal, indeterminate grade. - Aortic valve: Mildly calcified annulus. Trileaflet; mildly thickened leaflets. Valve area (VTI): 2.07 cm^2. Valve area (Vmax): 2.04 cm^2. Valve area (Vmean): 2.25 cm^2. - Mitral valve: Mildly calcified annulus. Mildly thickened leaflets. There was mild regurgitation. - Left atrium: The atrium was mildly dilated. - Right atrium: The atrium was mildly dilated. - Pulmonary arteries: Systolic pressure was moderately increased. PA peak pressure: 53 mm Hg (S). PASP may be underestimated based on available TR  spectral Doppler waveform.  If pre-operative labs DOS are acceptable, I anticipate pt can proceed with surgery as scheduled.   Willeen Cass, FNP-BC [facility]/Anesthesiology Phone: [phone] 09/04/2014 2:01 PM

## 2014-09-06 MED ORDER — CEFUROXIME SODIUM 1.5 G IJ SOLR
1.5000 g | INTRAMUSCULAR | Status: AC
  Administered 2014-09-07: 1.5 g via INTRAVENOUS
  Filled 2014-09-06: qty 1.5

## 2014-09-07 ENCOUNTER — Encounter (HOSPITAL_COMMUNITY): Payer: Self-pay | Admitting: Critical Care Medicine

## 2014-09-07 ENCOUNTER — Encounter (HOSPITAL_COMMUNITY)
Admission: RE | Disposition: A | Discharge status: Home / Self Care | Payer: Self-pay | Source: Ambulatory Visit | Attending: Vascular Surgery

## 2014-09-07 ENCOUNTER — Ambulatory Visit (HOSPITAL_COMMUNITY): Payer: Medicare Other | Admitting: Emergency Medicine

## 2014-09-07 ENCOUNTER — Ambulatory Visit (HOSPITAL_COMMUNITY)
Admission: RE | Admit: 2014-09-07 | Discharge: 2014-09-07 | Disposition: A | Discharge status: Home / Self Care | Payer: Medicare Other | Source: Ambulatory Visit | Attending: Vascular Surgery | Admitting: Vascular Surgery

## 2014-09-07 DIAGNOSIS — Z992 Dependence on renal dialysis: Secondary | ICD-10-CM | POA: Diagnosis not present

## 2014-09-07 DIAGNOSIS — I272 Other secondary pulmonary hypertension: Secondary | ICD-10-CM | POA: Insufficient documentation

## 2014-09-07 DIAGNOSIS — Z79899 Other long term (current) drug therapy: Secondary | ICD-10-CM | POA: Insufficient documentation

## 2014-09-07 DIAGNOSIS — Z7982 Long term (current) use of aspirin: Secondary | ICD-10-CM | POA: Insufficient documentation

## 2014-09-07 DIAGNOSIS — I4891 Unspecified atrial fibrillation: Secondary | ICD-10-CM | POA: Insufficient documentation

## 2014-09-07 DIAGNOSIS — N185 Chronic kidney disease, stage 5: Secondary | ICD-10-CM

## 2014-09-07 DIAGNOSIS — Z7951 Long term (current) use of inhaled steroids: Secondary | ICD-10-CM | POA: Insufficient documentation

## 2014-09-07 DIAGNOSIS — E780 Pure hypercholesterolemia: Secondary | ICD-10-CM | POA: Insufficient documentation

## 2014-09-07 DIAGNOSIS — K219 Gastro-esophageal reflux disease without esophagitis: Secondary | ICD-10-CM | POA: Diagnosis not present

## 2014-09-07 DIAGNOSIS — N186 End stage renal disease: Secondary | ICD-10-CM | POA: Diagnosis present

## 2014-09-07 DIAGNOSIS — I5033 Acute on chronic diastolic (congestive) heart failure: Secondary | ICD-10-CM | POA: Insufficient documentation

## 2014-09-07 DIAGNOSIS — E1122 Type 2 diabetes mellitus with diabetic chronic kidney disease: Secondary | ICD-10-CM | POA: Insufficient documentation

## 2014-09-07 DIAGNOSIS — I120 Hypertensive chronic kidney disease with stage 5 chronic kidney disease or end stage renal disease: Secondary | ICD-10-CM | POA: Diagnosis not present

## 2014-09-07 HISTORY — PX: AV FISTULA PLACEMENT: SHX1204

## 2014-09-07 LAB — POCT I-STAT 4, (NA,K, GLUC, HGB,HCT)
GLUCOSE: 167 mg/dL — AB (ref 70–99)
HCT: 36 % (ref 36.0–46.0)
HEMOGLOBIN: 12.2 g/dL (ref 12.0–15.0)
POTASSIUM: 4.7 mmol/L (ref 3.5–5.1)
Sodium: 132 mmol/L — ABNORMAL LOW (ref 135–145)

## 2014-09-07 LAB — GLUCOSE, CAPILLARY
Glucose-Capillary: 155 mg/dL — ABNORMAL HIGH (ref 70–99)
Glucose-Capillary: 161 mg/dL — ABNORMAL HIGH (ref 70–99)

## 2014-09-07 SURGERY — ARTERIOVENOUS (AV) FISTULA CREATION
Anesthesia: Monitor Anesthesia Care | Site: Arm Lower | Laterality: Left

## 2014-09-07 MED ORDER — LIDOCAINE-EPINEPHRINE (PF) 1 %-1:200000 IJ SOLN
INTRAMUSCULAR | Status: AC
  Filled 2014-09-07: qty 10

## 2014-09-07 MED ORDER — HEPARIN SODIUM (PORCINE) 1000 UNIT/ML IJ SOLN
INTRAMUSCULAR | Status: AC
  Filled 2014-09-07: qty 1

## 2014-09-07 MED ORDER — CHLORHEXIDINE GLUCONATE CLOTH 2 % EX PADS: 6.0000 | MEDICATED_PAD | Freq: Once | CUTANEOUS | Status: DC

## 2014-09-07 MED ORDER — PHENYLEPHRINE 40 MCG/ML (10ML) SYRINGE FOR IV PUSH (FOR BLOOD PRESSURE SUPPORT)
PREFILLED_SYRINGE | INTRAVENOUS | Status: AC
  Filled 2014-09-07: qty 20

## 2014-09-07 MED ORDER — ONDANSETRON HCL 4 MG/2ML IJ SOLN
INTRAMUSCULAR | Status: DC | PRN
  Administered 2014-09-07: 4 mg via INTRAVENOUS

## 2014-09-07 MED ORDER — LIDOCAINE HCL (CARDIAC) 20 MG/ML IV SOLN
INTRAVENOUS | Status: AC
  Filled 2014-09-07: qty 5

## 2014-09-07 MED ORDER — ACETAMINOPHEN 160 MG/5ML PO SOLN
325.0000 mg | ORAL | Status: DC | PRN
  Filled 2014-09-07: qty 20.3

## 2014-09-07 MED ORDER — LIDOCAINE HCL (PF) 1 % IJ SOLN
INTRAMUSCULAR | Status: DC | PRN
  Administered 2014-09-07: 8 mL via INTRADERMAL

## 2014-09-07 MED ORDER — CEFAZOLIN SODIUM-DEXTROSE 2-3 GM-% IV SOLR
INTRAVENOUS | Status: AC
  Filled 2014-09-07: qty 50

## 2014-09-07 MED ORDER — OXYCODONE HCL 5 MG/5ML PO SOLN: 5.0000 mg | Freq: Once | ORAL | Status: DC | PRN

## 2014-09-07 MED ORDER — MIDAZOLAM HCL 2 MG/2ML IJ SOLN
INTRAMUSCULAR | Status: AC
  Filled 2014-09-07: qty 2

## 2014-09-07 MED ORDER — SUCCINYLCHOLINE CHLORIDE 20 MG/ML IJ SOLN
INTRAMUSCULAR | Status: AC
  Filled 2014-09-07: qty 1

## 2014-09-07 MED ORDER — EPHEDRINE SULFATE 50 MG/ML IJ SOLN
INTRAMUSCULAR | Status: AC
  Filled 2014-09-07: qty 1

## 2014-09-07 MED ORDER — PROPOFOL 10 MG/ML IV BOLUS
INTRAVENOUS | Status: AC
  Filled 2014-09-07: qty 20

## 2014-09-07 MED ORDER — ACETAMINOPHEN 325 MG PO TABS: 325.0000 mg | ORAL_TABLET | ORAL | Status: DC | PRN

## 2014-09-07 MED ORDER — FENTANYL CITRATE 0.05 MG/ML IJ SOLN
INTRAMUSCULAR | Status: AC
  Filled 2014-09-07: qty 5

## 2014-09-07 MED ORDER — MIDAZOLAM HCL 5 MG/5ML IJ SOLN
INTRAMUSCULAR | Status: DC | PRN
  Administered 2014-09-07: 2 mg via INTRAVENOUS

## 2014-09-07 MED ORDER — HEPARIN SODIUM (PORCINE) 1000 UNIT/ML IJ SOLN
INTRAMUSCULAR | Status: DC | PRN
  Administered 2014-09-07: 5000 [IU] via INTRAVENOUS

## 2014-09-07 MED ORDER — 0.9 % SODIUM CHLORIDE (POUR BTL) OPTIME
TOPICAL | Status: DC | PRN
  Administered 2014-09-07: 1000 mL

## 2014-09-07 MED ORDER — SODIUM CHLORIDE 0.9 % IJ SOLN
INTRAMUSCULAR | Status: AC
  Filled 2014-09-07: qty 10

## 2014-09-07 MED ORDER — LIDOCAINE HCL (CARDIAC) 20 MG/ML IV SOLN
INTRAVENOUS | Status: AC
  Filled 2014-09-07: qty 10

## 2014-09-07 MED ORDER — LIDOCAINE HCL (PF) 1 % IJ SOLN
INTRAMUSCULAR | Status: AC
  Filled 2014-09-07: qty 30

## 2014-09-07 MED ORDER — OXYCODONE-ACETAMINOPHEN 5-325 MG PO TABS: 1.0000 | ORAL_TABLET | Freq: Four times a day (QID) | ORAL | Status: DC | PRN

## 2014-09-07 MED ORDER — ONDANSETRON HCL 4 MG/2ML IJ SOLN
INTRAMUSCULAR | Status: AC
  Filled 2014-09-07: qty 2

## 2014-09-07 MED ORDER — SODIUM CHLORIDE 0.9 % IV SOLN: INTRAVENOUS | Status: DC

## 2014-09-07 MED ORDER — DEXAMETHASONE SODIUM PHOSPHATE 4 MG/ML IJ SOLN
INTRAMUSCULAR | Status: AC
  Filled 2014-09-07: qty 1

## 2014-09-07 MED ORDER — SODIUM CHLORIDE 0.9 % IV SOLN
INTRAVENOUS | Status: DC | PRN
  Administered 2014-09-07: 07:00:00 via INTRAVENOUS

## 2014-09-07 MED ORDER — PHENYLEPHRINE 40 MCG/ML (10ML) SYRINGE FOR IV PUSH (FOR BLOOD PRESSURE SUPPORT)
PREFILLED_SYRINGE | INTRAVENOUS | Status: AC
  Filled 2014-09-07: qty 10

## 2014-09-07 MED ORDER — SODIUM CHLORIDE 0.9 % IR SOLN
Status: DC | PRN
  Administered 2014-09-07: 07:00:00

## 2014-09-07 MED ORDER — NEOSTIGMINE METHYLSULFATE 10 MG/10ML IV SOLN
INTRAVENOUS | Status: AC
  Filled 2014-09-07: qty 3

## 2014-09-07 MED ORDER — OXYCODONE HCL 5 MG PO TABS: 5.0000 mg | ORAL_TABLET | Freq: Once | ORAL | Status: DC | PRN

## 2014-09-07 MED ORDER — FENTANYL CITRATE 0.05 MG/ML IJ SOLN: 25.0000 ug | INTRAMUSCULAR | Status: DC | PRN

## 2014-09-07 MED ORDER — FENTANYL CITRATE 0.05 MG/ML IJ SOLN
INTRAMUSCULAR | Status: DC | PRN
  Administered 2014-09-07: 50 ug via INTRAVENOUS
  Administered 2014-09-07 (×2): 25 ug via INTRAVENOUS

## 2014-09-07 MED ORDER — PROPOFOL INFUSION 10 MG/ML OPTIME
INTRAVENOUS | Status: DC | PRN
  Administered 2014-09-07: 50 ug/kg/min via INTRAVENOUS

## 2014-09-07 SURGICAL SUPPLY — 35 items
ARMBAND PINK RESTRICT EXTREMIT (MISCELLANEOUS) ×2 IMPLANT
CANISTER SUCTION 2500CC (MISCELLANEOUS) ×2 IMPLANT
CANNULA VESSEL 3MM 2 BLNT TIP (CANNULA) ×2 IMPLANT
CLIP TI MEDIUM 6 (CLIP) ×2 IMPLANT
CLIP TI WIDE RED SMALL 6 (CLIP) ×2 IMPLANT
COVER PROBE W GEL 5X96 (DRAPES) IMPLANT
DECANTER SPIKE VIAL GLASS SM (MISCELLANEOUS) ×2 IMPLANT
DRAIN PENROSE 1/4X12 LTX STRL (WOUND CARE) ×2 IMPLANT
ELECT REM PT RETURN 9FT ADLT (ELECTROSURGICAL) ×2
ELECTRODE REM PT RTRN 9FT ADLT (ELECTROSURGICAL) ×1 IMPLANT
GLOVE BIO SURGEON STRL SZ 6.5 (GLOVE) ×4 IMPLANT
GLOVE BIO SURGEON STRL SZ7.5 (GLOVE) ×2 IMPLANT
GLOVE BIOGEL PI IND STRL 7.0 (GLOVE) ×1 IMPLANT
GLOVE BIOGEL PI IND STRL 7.5 (GLOVE) ×1 IMPLANT
GLOVE BIOGEL PI INDICATOR 7.0 (GLOVE) ×1
GLOVE BIOGEL PI INDICATOR 7.5 (GLOVE) ×1
GLOVE ECLIPSE 6.5 STRL STRAW (GLOVE) ×6 IMPLANT
GLOVE ECLIPSE 7.0 STRL STRAW (GLOVE) ×2 IMPLANT
GOWN STRL REUS W/ TWL LRG LVL3 (GOWN DISPOSABLE) ×4 IMPLANT
GOWN STRL REUS W/TWL LRG LVL3 (GOWN DISPOSABLE) ×4
KIT BASIN OR (CUSTOM PROCEDURE TRAY) ×2 IMPLANT
KIT ROOM TURNOVER OR (KITS) ×2 IMPLANT
LIQUID BAND (GAUZE/BANDAGES/DRESSINGS) ×2 IMPLANT
LOOP VESSEL MINI RED (MISCELLANEOUS) IMPLANT
NS IRRIG 1000ML POUR BTL (IV SOLUTION) ×2 IMPLANT
PACK CV ACCESS (CUSTOM PROCEDURE TRAY) ×2 IMPLANT
PAD ARMBOARD 7.5X6 YLW CONV (MISCELLANEOUS) ×4 IMPLANT
SPONGE SURGIFOAM ABS GEL 100 (HEMOSTASIS) IMPLANT
SUT PROLENE 6 0 BV (SUTURE) ×2 IMPLANT
SUT PROLENE 7 0 BV 1 (SUTURE) ×2 IMPLANT
SUT VIC AB 3-0 SH 27 (SUTURE) ×1
SUT VIC AB 3-0 SH 27X BRD (SUTURE) ×1 IMPLANT
SUT VICRYL 4-0 PS2 18IN ABS (SUTURE) ×2 IMPLANT
UNDERPAD 30X30 INCONTINENT (UNDERPADS AND DIAPERS) ×2 IMPLANT
WATER STERILE IRR 1000ML POUR (IV SOLUTION) ×2 IMPLANT

## 2014-09-07 NOTE — Progress Notes (Signed)
MD aware of elevated BP, will continue to monitor.

## 2014-09-07 NOTE — Op Note (Signed)
Procedure: Left Radial Cephalic AV fistula   Preop: ESRD   Postop: ESRD   Anesthesia: MAC with local   Assistant:Maureen Collins, PA-c   Findings: 3 mm cephalic vein   2.5 mm radial artery   Procedure Details:  The left upper extremity was prepped and draped in usual sterile fashion. Local anesthesia was infiltrated midway between the cephalic and radial artery anatomically. A longitudinal skin incision was then made in this location at the distal left forearm. The incision was carried into the subcutaneous tissues down to level cephalic vein. The vein had some spasm but was overall reasonable quality accepting a 3.5 mm dilator. The vein was dissected free circumferentially and small side branches ligated and divided between silk ties. The distal end was ligated and the vein probed and found to accept up to a 3 mm dilator. This was gently distended with heparinized saline, spatulated, and marked for orientation. Next the radial artery was dissected free in the medial portion incision. The artery was 2.5 mm in diameter but had a reasonable pulse. The vessel loops were placed proximal and distal to the planned site of arteriotomy. The patient was given 5000 units of intravenous heparin. After appropriate circulation time, the vessel loops were used to control the artery. A longitudinal opening was made in the left radial artery. The vein was controlled proximally with a fine bulldog clamp. The vein was then swung over to the artery and sewn end of vein to side of artery using a running 7-0 Prolene suture. Just prior to completion, the anastomosis was fore bled back bled and thoroughly flushed. The anastomosis was secured, vessel loops released, and there was a palpable pulse in the fistula immediately with still some spasm in the vein. After hemostasis was obtained, the subcutaneous tissues were reapproximated using a running 3-0 Vicryl suture. The skin was then closed with a 4 Vicryl subcuticular  stitch. Dermabond was applied to the skin incision. The patient tolerated the procedure well and there were no complications. Instrument sponge and needle count were correct at the end of the case. The patient was taken to PACU in stable condition.   Ruta Hinds, MD  [facility]  Office: [phone]  Pager: [phone]

## 2014-09-07 NOTE — H&P (Signed)
Patient name: Brenda Lee          MRN: [id number]      DOB: Jul 08, 1950         Sex: female   Referred by: Lowanda Foster  Reason for referral:   Chief Complaint   Patient presents with   .  New Evaluation       Ref. by  Dr. Lowanda Foster  C/O  CKD  stage V Eval. for access  Pt. HD: Tue-Thur-Sat.      HISTORY OF PRESENT ILLNESS: This is a very pleasant 64 year old female who started dialysis 7 weeks ago.  She has a right-sided catheter.  She is right-handed.  She dialyzes on Tuesday Thursday Saturday.  The patient's renal failure secondary to hypertension and diabetes.  Her diabetes has been well controlled.  Her most recent hemoglobin A1c was in the 5 range. She is on a statin for hypercholesterolemia.  She suffers from atrial fibrillation but is not yet on anticoagulation.    yPast Medical History   Diagnosis  Date   .  Diabetes mellitus without complication     .  Hypertension     .  Anemia     .  CKD (chronic kidney disease)     .  CHF (congestive heart failure)     .  Renal insufficiency     .  Acute kidney injury  06/08/2014   .  Acute on chronic diastolic heart failure  [id number]   .  Pulmonary hypertension  06/12/2014   .  Atrial fibrillation with RVR  06/11/2014       Past Surgical History   Procedure  Laterality  Date   .  Jugular cath  Right  Feb. 2, 2016       HD       History      Social History   .  Marital Status:  Married       Spouse Name:  N/A   .  Number of Children:  N/A   .  Years of Education:  N/A      Occupational History   .  Not on file.      Social History Main Topics   .  Smoking status:  Never Smoker    .  Smokeless tobacco:  Never Used   .  Alcohol Use:  No   .  Drug Use:  No   .  Sexual Activity:  Not on file      Other Topics  Concern   .  Not on file      Social History Narrative       Family History   Problem  Relation  Age of Onset   .  Aneurysm  Mother         She fell and Hit head   .  Diabetes  Father     .  Heart attack   Father     .  Heart disease  Father         Before age 34       Allergies as of 08/03/2014 - Review Complete 08/03/2014   Allergen  Reaction  Noted   .  Morphine and related  Nausea And Vomiting  04/25/2014       Current Outpatient Prescriptions on File Prior to Visit   Medication  Sig  Dispense  Refill   .  aspirin EC 81 MG tablet  Take 81 mg by mouth daily.       Marland Kitchen  atorvastatin (LIPITOR) 10 MG tablet  Take 10 mg by mouth at bedtime.       .  calcium acetate (PHOSLO) 667 MG capsule  Take 667 mg by mouth 3 (three) times daily with meals.       Marland Kitchen  diltiazem (CARDIZEM CD) 180 MG 24 hr capsule  Take 1 capsule (180 mg total) by mouth daily. New medication for your blood pressure and heart rate.  30 capsule  3   .  fexofenadine (ALLEGRA) 180 MG tablet  Take 180 mg by mouth daily.    0   .  fluticasone (FLONASE) 50 MCG/ACT nasal spray  Place 2 sprays into both nostrils daily. (Patient taking differently: Place 2 sprays into both nostrils daily as needed for allergies. )  16 g  2   .  glimepiride (AMARYL) 2 MG tablet  Take one-half tablet daily only if your blood sugar is greater than 160.  Check your blood sugar daily. (Patient taking differently: Take 1 mg by mouth daily as needed (if blood sugar is greater than 160). Check blood sugar daily.)       .  metoprolol (LOPRESSOR) 50 MG tablet  Take 1 tablet (50 mg total) by mouth 2 (two) times daily. New medication for your blood pressure and heart rate.  60 tablet  3   .  omeprazole (PRILOSEC) 20 MG capsule  Take 1 capsule (20 mg total) by mouth daily. For gastric acid reflux.  30 capsule  1.   .  sodium chloride (OCEAN) 0.65 % SOLN nasal spray  Place 1 spray into both nostrils as needed for congestion.  480 mL  0   .  Vitamin D, Ergocalciferol, (DRISDOL) 50000 UNITS CAPS capsule  Take 1 capsule (50,000 Units total) by mouth every 7 (seven) days. (Patient taking differently: Take 50,000 Units by mouth every 7 (seven) days. On Thursday.)  30 capsule  3    .  zolpidem (AMBIEN) 5 MG tablet  Take 5 mg by mouth at bedtime.    0      No current facility-administered medications on file prior to visit.      REVIEW OF SYSTEMS: Cardiovascular: No chest pain, chest pressure, palpitations, orthopnea, or dyspnea on exertion. No claudication or rest pain,  No history of DVT or phlebitis.positive for leg swelling   Pulmonary: No productive cough, asthma or wheezing. Neurologic: No weakness, paresthesias, aphasia, or amaurosis. No dizziness. Hematologic: No bleeding problems or clotting disorders. Musculoskeletal: No joint pain or joint swelling. Gastrointestinal: No blood in stool or hematemesis Genitourinary: No dysuria or hematuria. Psychiatric:: No history of major depression. Integumentary: No rashes or ulcers. Constitutional: No fever or chills.  PHYSICAL EXAMINATION: General: The patient appears their stated age.    Filed Vitals:   09/07/14 0618  BP: 213/82  Pulse: 84  Temp: 98.6 F (37 C)  TempSrc: Oral  Resp: 20  Height: 5\' 7"  (1.702 m)  Weight: 177 lb (80.287 kg)  SpO2: 96%    HEENT:  No gross abnormalities Pulmonary: Respirations are non-labored Musculoskeletal: There are no major deformities.   Neurologic: No focal weakness or paresthesias are detected, Skin: There are no ulcer or rashes noted. Psychiatric: The patient has normal affect. Cardiovascular: There is a regular rate and rhythm without significant murmur appreciated.palpable radial brachial pulse on the left   Diagnostic Studies: Abnormal arterial study. Excellent left cephalic vein beginning at the wrist    Assessment:   End stage renal disease plan:   I  discussed proceeding with a left radiocephalic fistula.  We discussed the risks of non-maturity and the need for future interventions.  We also discussed the risk of steal syndrome.  I am going to try to do this on Thursday so as to accommodate her schedule.  This may need to be done by one of my partners  in order to get her taken care of as soon as possible.  Ruta Hinds, MD [facility] [place] [facility] [facility] Office: [phone] Pager: [phone]

## 2014-09-07 NOTE — Transfer of Care (Signed)
Immediate Anesthesia Transfer of Care Note  Patient: Brenda Lee  Procedure(s) Performed: Procedure(s): ARTERIOVENOUS (AV) FISTULA CREATION-LEFT (Left)  Patient Location: PACU  Anesthesia Type:MAC  Level of Consciousness: awake, alert , oriented and patient cooperative  Airway & Oxygen Therapy: Patient Spontanous Breathing and Patient connected to face mask oxygen  Post-op Assessment: Report given to RN, Post -op Vital signs reviewed and stable and Patient moving all extremities  Post vital signs: Reviewed and stable  Last Vitals:  Filed Vitals:   09/07/14 0618  BP: 213/82  Pulse: 84  Temp: 37 C  Resp: 20    Complications: No apparent anesthesia complications

## 2014-09-07 NOTE — Anesthesia Procedure Notes (Signed)
Procedure Name: MAC Date/Time: 09/07/2014 7:40 AM Performed by: Merrilyn Puma B Pre-anesthesia Checklist: Patient identified, Timeout performed, Emergency Drugs available, Suction available and Patient being monitored Patient Re-evaluated:Patient Re-evaluated prior to inductionOxygen Delivery Method: Simple face mask Intubation Type: IV induction Placement Confirmation: positive ETCO2 and breath sounds checked- equal and bilateral Dental Injury: Teeth and Oropharynx as per pre-operative assessment

## 2014-09-07 NOTE — Anesthesia Preprocedure Evaluation (Addendum)
Anesthesia Evaluation  Patient identified by MRN, date of birth, ID band Patient awake    Reviewed: Allergy & Precautions, NPO status , Patient's Chart, lab work & pertinent test results, reviewed documented beta blocker date and time   History of Anesthesia Complications Negative for: history of anesthetic complications  Airway Mallampati: II  TM Distance: >3 FB Neck ROM: Full    Dental  (+) Dental Advisory Given, Teeth Intact   Pulmonary neg pulmonary ROS,  breath sounds clear to auscultation        Cardiovascular hypertension, Pt. on medications and Pt. on home beta blockers - angina+CHF - Past MI + dysrhythmias Atrial Fibrillation Rhythm:Regular     Neuro/Psych negative neurological ROS  negative psych ROS   GI/Hepatic GERD-  Medicated and Controlled,  Endo/Other  diabetes, Type 2, Oral Hypoglycemic Agents  Renal/GU ESRF and DialysisRenal disease     Musculoskeletal   Abdominal   Peds  Hematology   Anesthesia Other Findings   Reproductive/Obstetrics                            Anesthesia Physical Anesthesia Plan  ASA: III  Anesthesia Plan: MAC   Post-op Pain Management:    Induction: Intravenous  Airway Management Planned:   Additional Equipment: None  Intra-op Plan:   Post-operative Plan:   Informed Consent: I have reviewed the patients History and Physical, chart, labs and discussed the procedure including the risks, benefits and alternatives for the proposed anesthesia with the patient or authorized representative who has indicated his/her understanding and acceptance.   Dental advisory given  Plan Discussed with: CRNA and Surgeon  Anesthesia Plan Comments:         Anesthesia Quick Evaluation

## 2014-09-07 NOTE — Anesthesia Postprocedure Evaluation (Signed)
  Anesthesia Post-op Note  Patient: Brenda Lee  Procedure(s) Performed: Procedure(s): ARTERIOVENOUS (AV) FISTULA CREATION-LEFT (Left)  Patient Location: PACU  Anesthesia Type:MAC  Level of Consciousness: awake and alert   Airway and Oxygen Therapy: Patient Spontanous Breathing  Post-op Pain: none  Post-op Assessment: Post-op Vital signs reviewed, Patient's Cardiovascular Status Stable, Respiratory Function Stable, Patent Airway, No signs of Nausea or vomiting and Pain level controlled  Post-op Vital Signs: Reviewed and stable  Last Vitals:  Filed Vitals:   09/07/14 1019  BP: 165/73  Pulse: 83  Temp:   Resp: 20    Complications: No apparent anesthesia complications

## 2014-09-08 ENCOUNTER — Telehealth: Payer: Self-pay | Admitting: Vascular Surgery

## 2014-09-08 ENCOUNTER — Encounter (HOSPITAL_COMMUNITY): Payer: Self-pay | Admitting: Vascular Surgery

## 2014-09-08 NOTE — Telephone Encounter (Signed)
Spoke with pt to schedule, dpm °

## 2014-09-08 NOTE — Telephone Encounter (Signed)
-----   Message from Mena Goes, RN sent at 09/07/2014 12:58 PM EDT ----- Regarding: Schedule   ----- Message -----    From: Ulyses Amor, PA-C    Sent: 09/07/2014   9:00 AM      To: [facility] Charge Pool  S/P left AV fistula creation f/u in 2 weeks no scans

## 2014-09-16 ENCOUNTER — Ambulatory Visit: Payer: BLUE CROSS/BLUE SHIELD | Admitting: Cardiology

## 2014-09-24 ENCOUNTER — Encounter: Payer: BLUE CROSS/BLUE SHIELD | Admitting: Vascular Surgery

## 2014-09-30 ENCOUNTER — Encounter: Payer: Self-pay | Admitting: Vascular Surgery

## 2014-10-01 ENCOUNTER — Encounter: Payer: Self-pay | Admitting: Vascular Surgery

## 2014-10-01 ENCOUNTER — Ambulatory Visit (INDEPENDENT_AMBULATORY_CARE_PROVIDER_SITE_OTHER): Payer: Self-pay | Admitting: Vascular Surgery

## 2014-10-01 VITALS — BP 161/94 | HR 153 | Ht 67.0 in | Wt 171.5 lb

## 2014-10-01 DIAGNOSIS — N186 End stage renal disease: Secondary | ICD-10-CM

## 2014-10-01 DIAGNOSIS — Z992 Dependence on renal dialysis: Secondary | ICD-10-CM

## 2014-10-01 DIAGNOSIS — Z4931 Encounter for adequacy testing for hemodialysis: Secondary | ICD-10-CM

## 2014-10-01 NOTE — Progress Notes (Signed)
She is a 64 year old female who is status post placement of a left radiocephalic AV fistula on April 4. She returns today for postoperative follow-up. She denies any numbness or tingling in her hand. Dialyzing via a right-sided catheter. She is starting to exercise the fistula. She denies any drainage from the incision.  Physical exam:  Filed Vitals:   10/01/14 1529  BP: 161/94  Pulse: 153  Height: 5\' 7"  (1.702 m)  Weight: 171 lb 8 oz (77.792 kg)  SpO2: 100%    Left upper extremity: Healing left radiocephalic incision palpable thrill in fistula with audible bruit  Assessment: Patent left radial cephalic AV fistula. Patient will follow-up in 6 weeks with a duplex ultrasound to assess of the fistula is maturing. The patient was counseled today and how to exercise and check patency of the fistula.  Ruta Hinds, MD [facility] Office: [phone] Pager: [phone]

## 2014-10-01 NOTE — Addendum Note (Signed)
Addended by: Mena Goes on: 10/01/2014 04:10 PM   Modules accepted: Orders

## 2014-10-05 ENCOUNTER — Encounter: Payer: Self-pay | Admitting: Cardiology

## 2014-10-05 ENCOUNTER — Ambulatory Visit (INDEPENDENT_AMBULATORY_CARE_PROVIDER_SITE_OTHER): Payer: BLUE CROSS/BLUE SHIELD | Admitting: Cardiology

## 2014-10-05 ENCOUNTER — Encounter: Payer: Self-pay | Admitting: *Deleted

## 2014-10-05 VITALS — BP 214/85 | HR 85 | Ht 67.0 in | Wt 175.1 lb

## 2014-10-05 DIAGNOSIS — I4891 Unspecified atrial fibrillation: Secondary | ICD-10-CM

## 2014-10-05 DIAGNOSIS — I272 Pulmonary hypertension, unspecified: Secondary | ICD-10-CM

## 2014-10-05 DIAGNOSIS — I10 Essential (primary) hypertension: Secondary | ICD-10-CM

## 2014-10-05 DIAGNOSIS — I270 Primary pulmonary hypertension: Secondary | ICD-10-CM

## 2014-10-05 DIAGNOSIS — I5032 Chronic diastolic (congestive) heart failure: Secondary | ICD-10-CM | POA: Diagnosis not present

## 2014-10-05 NOTE — Progress Notes (Signed)
Clinical Summary Brenda Lee is a 64 y.o.female seen today for follow up of the following medical problems.   1. Chronic diastolic heart failure - echo shows LVEF 60-65%, grade II diastolic dysfunction, she also has some mild mitral stenosis (mean grad 5) and pulmonary HTN, RV function is normal. - denies any breathing issues. Weight is stable at [phone] lbs. Decreaed LE edema since starting dialysis. Denies and SOB or DOE.    2. ESRD - followed by nephrlogogy, recent vascular procedure for long term access   3. Pulmonary HTN elevated PASP on echo at 74. Likely multifactorial including grade II diastolic dysfunction with active fluid overload, mild to moderate gradient across MV consistent with mild to mod mitral stenosis. - repeat echo after she had started regular HD shows PASP 53, normal RV function - stable LE edema, no significant SOB  4. Afib - new diagnosis during recent admission - on metoprolol for rate control. Severe anemia during admission requiring transfusion, she has not been started on anticoag.  - denies any palpitations since our last visit.   5. Anemia - transfused 2 units pRBCs during recent admit and received IV iron. Discharge Hgb 9.2. Followed by renal and pcp.  - she has not wanted EGD or colonoscopy  6. HTN - checking bp at home daily. Typically 140s/80s at home. Does have some white coat HTN.  - compliant with meds Past Medical History  Diagnosis Date  . Diabetes mellitus without complication   . Hypertension   . Anemia   . CKD (chronic kidney disease)   . CHF (congestive heart failure)   . Renal insufficiency   . Acute kidney injury 06/08/2014  . Acute on chronic diastolic heart failure [id number]  . Pulmonary hypertension 06/12/2014  . Atrial fibrillation with RVR 06/11/2014     Allergies  Allergen Reactions  . Morphine And Related Nausea And Vomiting     Current Outpatient Prescriptions  Medication Sig Dispense Refill  . aspirin  EC 81 MG tablet Take 81 mg by mouth daily.    . calcium acetate (PHOSLO) 667 MG capsule Take 667 mg by mouth. Take four tablets at each meal and one with snacks    . diltiazem (CARDIZEM CD) 180 MG 24 hr capsule Take 1 capsule (180 mg total) by mouth daily. New medication for your blood pressure and heart rate. 30 capsule 3  . fexofenadine (ALLEGRA) 180 MG tablet Take 180 mg by mouth daily.  0  . fluticasone (FLONASE) 50 MCG/ACT nasal spray Place 2 sprays into both nostrils daily. (Patient taking differently: Place 2 sprays into both nostrils daily as needed for allergies. ) 16 g 2  . glimepiride (AMARYL) 2 MG tablet Take one-half tablet daily only if your blood sugar is greater than 160. Check your blood sugar daily. (Patient taking differently: Take 1 mg by mouth daily as needed (if blood sugar is greater than 160). Check blood sugar daily.)    . metoprolol (LOPRESSOR) 50 MG tablet Take 1 tablet (50 mg total) by mouth 2 (two) times daily. New medication for your blood pressure and heart rate. (Patient taking differently: Take 50 mg by mouth daily. New medication for your blood pressure and heart rate.) 60 tablet 3  . omeprazole (PRILOSEC) 20 MG capsule Take 1 capsule (20 mg total) by mouth daily. For gastric acid reflux. 30 capsule 1.  . oxyCODONE-acetaminophen (PERCOCET/ROXICET) 5-325 MG per tablet Take 1 tablet by mouth every 6 (six) hours as needed. (Patient not  taking: Reported on 10/01/2014) 30 tablet 0  . sodium chloride (OCEAN) 0.65 % SOLN nasal spray Place 1 spray into both nostrils as needed for congestion. 480 mL 0  . Vitamin D, Ergocalciferol, (DRISDOL) 50000 UNITS CAPS capsule Take 1 capsule (50,000 Units total) by mouth every 7 (seven) days. (Patient taking differently: Take 50,000 Units by mouth every 7 (seven) days. On Thursday.) 30 capsule 3  . zolpidem (AMBIEN) 5 MG tablet Take 5 mg by mouth at bedtime.  0   No current facility-administered medications for this visit.     Past  Surgical History  Procedure Laterality Date  . Jugular cath Right Feb. 2, 2016    HD  . Av fistula placement Left 09/07/2014    Procedure: ARTERIOVENOUS (AV) FISTULA CREATION-LEFT;  Surgeon: Elam Dutch, MD;  Location: [facility];  Service: Vascular;  Laterality: Left;     Allergies  Allergen Reactions  . Morphine And Related Nausea And Vomiting      Family History  Problem Relation Age of Onset  . Aneurysm Mother     She fell and Hit head  . Diabetes Father   . Heart attack Father   . Heart disease Father     Before age 67     Social History Brenda Lee reports that she has never smoked. She has never used smokeless tobacco. Brenda Lee reports that she does not drink alcohol.   Review of Systems CONSTITUTIONAL: No weight loss, fever, chills, weakness or fatigue.  HEENT: Eyes: No visual loss, blurred vision, double vision or yellow sclerae.No hearing loss, sneezing, congestion, runny nose or sore throat.  SKIN: No rash or itching.  CARDIOVASCULAR: per HPI RESPIRATORY: No shortness of breath, cough or sputum.  GASTROINTESTINAL: No anorexia, nausea, vomiting or diarrhea. No abdominal pain or blood.  GENITOURINARY: No burning on urination, no polyuria NEUROLOGICAL: No headache, dizziness, syncope, paralysis, ataxia, numbness or tingling in the extremities. No change in bowel or bladder control.  MUSCULOSKELETAL: No muscle, back pain, joint pain or stiffness.  LYMPHATICS: No enlarged nodes. No history of splenectomy.  PSYCHIATRIC: No history of depression or anxiety.  ENDOCRINOLOGIC: No reports of sweating, cold or heat intolerance. No polyuria or polydipsia.  Marland Kitchen   Physical Examination p 85 bp 214/85 Wt 175 lbs BMI 27 Gen: resting comfortably, no acute distress HEENT: no scleral icterus, pupils equal round and reactive, no palptable cervical adenopathy,  CV: irreg, no m/r/g, no JVD, no carotid bruits Resp: Clear to auscultation bilaterally GI: abdomen is soft,  non-tender, non-distended, normal bowel sounds, no hepatosplenomegaly MSK: extremities are warm, no edema.  Skin: warm, no rash Neuro:  no focal deficits Psych: appropriate affect   Diagnostic Studies Jan 2016 Echo Study Conclusions  - Left ventricle: The cavity size was normal. Wall thickness was normal. Systolic function was normal. The estimated ejection fraction was in the range of 60% to 65%. Wall motion was normal; there were no regional wall motion abnormalities. Features are consistent with a pseudonormal left ventricular filling pattern, with concomitant abnormal relaxation and increased filling pressure (grade 2 diastolic dysfunction). - Aortic valve: Moderately calcified annulus. Trileaflet; moderately thickened leaflets. - Mitral valve: Mildly to moderately calcified annulus. Mildly thickened leaflets . The findings are consistent with mild to moderate stenosis. There was mild regurgitation. Mean gradient (D): 5 mm Hg. - Left atrium: The atrium was severely dilated. - Right ventricle: The cavity size was moderately dilated. It shares the apex with the LV. Systolic function was normal. RV  TAPSe is 2.0 cm. - Right atrium: The atrium was moderately dilated. - Pulmonary arteries: Systolic pressure was severely increased. PA peak pressure: 74 mm Hg (S). - Technically difficult study.   06/2014 VQ scan Perfusion: Small subsegmental defect is noted and posterior segment of right lower lobe which matches abnormality described on ventilation exam. No other significant defects are noted.  IMPRESSION: Low probability of pulmonary embolus.   08/2014 Echo Study Conclusions  - Left ventricle: The cavity size was normal. Wall thickness was increased in a pattern of moderate LVH. Systolic function was normal. The estimated ejection fraction was in the range of 55% to 60%. Diastolic function is abnormal, indeterminate grade. - Aortic valve:  Mildly calcified annulus. Trileaflet; mildly thickened leaflets. Valve area (VTI): 2.07 cm^2. Valve area (Vmax): 2.04 cm^2. Valve area (Vmean): 2.25 cm^2. - Mitral valve: Mildly calcified annulus. Mildly thickened leaflets . There was mild regurgitation. - Left atrium: The atrium was mildly dilated. - Right atrium: The atrium was mildly dilated. - Pulmonary arteries: Systolic pressure was moderately increased. PA peak pressure: 53 mm Hg (S). PASP may be underestimated based on available TR spectral Doppler waveform. - Technically adequate study.    Assessment and Plan  1. Chronic diastolic hear failure - denies current symptoms, fluid removal priamily by HD though she does make some urine and is on lasix - continue current meds  2. ESRD - HD per renal   3. Pulmonary HTN - PASP by echo since starting HD and controlling volume, suspect primarily due to her left sided dysfunction - continue HD and diuretics - ANA negative, VQ scan negative  4. Afib - continue rate control - significant anemia requriing recent transfusion during recent admission. Hgb has been stable since discharge, discussed starting anticoag. - will request most recent labs from her dialysis center [facility], if stable Hgb then plan to refer to our coumadin clinic  5. HTN - elevated in clinic, she reports white coat HTN and also has not taken her meds yet today. Home numbers at goal per report - will provide bp log to bring prior to her upcoming medical visit  F/u 3 months    Arnoldo Lenis, M.D.

## 2014-10-05 NOTE — Patient Instructions (Signed)
Your physician recommends that you schedule a follow-up appointment in: 3 months with Dr. Harl Bowie  Your physician recommends that you continue on your current medications as directed. Please refer to the Current Medication list given to you today.  Your physician has requested that you regularly monitor and record your blood pressure readings at home 1 WEEK PRIOR TO YOUR NEXT VISIT. Please use the same machine at the same time of day to check your readings and record them to bring to your follow-up visit.  WE WILL REQUEST LABS  Thank you for choosing [facility]!!

## 2014-10-19 ENCOUNTER — Telehealth: Payer: Self-pay | Admitting: *Deleted

## 2014-10-19 NOTE — Telephone Encounter (Signed)
-----   Message from Arnoldo Lenis, MD sent at 10/19/2014  3:50 PM EDT ----- Coumadin is the safest in dialysis patients. There is another medication we can consider however it has not been studies as well in dialysis patients and may have some increased risk of bleeding. Have her come see me in clinic in next few weeks in [place] so we can discuss everything in detail.   Brenda Abts MD ----- Message -----    From: Massie Maroon, CMA    Sent: 10/19/2014   2:51 PM      To: Arnoldo Lenis, MD  Pt does not want to be on coumadin is requesting another medication if possible.  Brenda Lee ----- Message -----    From: Arnoldo Lenis, MD    Sent: 10/19/2014  10:52 AM      To: Massie Maroon, CMA  Labs show this patients Hgb has been stable, can we refer her to [facility] for coumadin for her afib  Brenda Abts MD ----- Message -----    From: Arnoldo Lenis, MD    Sent: 10/05/2014  10:06 AM      To: Arnoldo Lenis, MD  F/u patients labs from [facility]. Likely coumadin for afib if Hgb stable  Brenda Abts MD

## 2014-10-19 NOTE — Telephone Encounter (Signed)
Pt scheduled for 5/27 with Dr. Harl Bowie Cx coumadin appt until after pt sees Dr. Harl Bowie will forward as Brenda Lee

## 2014-10-30 ENCOUNTER — Emergency Department (HOSPITAL_COMMUNITY): Payer: Medicare Other

## 2014-10-30 ENCOUNTER — Emergency Department (HOSPITAL_COMMUNITY)
Admission: EM | Admit: 2014-10-30 | Discharge: 2014-10-30 | Disposition: A | Discharge status: Home / Self Care | Payer: Medicare Other | Attending: Emergency Medicine | Admitting: Emergency Medicine

## 2014-10-30 ENCOUNTER — Ambulatory Visit: Payer: Medicare Other | Admitting: Cardiology

## 2014-10-30 ENCOUNTER — Encounter (HOSPITAL_COMMUNITY): Payer: Self-pay | Admitting: *Deleted

## 2014-10-30 DIAGNOSIS — I120 Hypertensive chronic kidney disease with stage 5 chronic kidney disease or end stage renal disease: Secondary | ICD-10-CM | POA: Insufficient documentation

## 2014-10-30 DIAGNOSIS — Y998 Other external cause status: Secondary | ICD-10-CM | POA: Insufficient documentation

## 2014-10-30 DIAGNOSIS — W1839XA Other fall on same level, initial encounter: Secondary | ICD-10-CM | POA: Diagnosis not present

## 2014-10-30 DIAGNOSIS — N186 End stage renal disease: Secondary | ICD-10-CM

## 2014-10-30 DIAGNOSIS — Z862 Personal history of diseases of the blood and blood-forming organs and certain disorders involving the immune mechanism: Secondary | ICD-10-CM | POA: Diagnosis not present

## 2014-10-30 DIAGNOSIS — Y9389 Activity, other specified: Secondary | ICD-10-CM | POA: Diagnosis not present

## 2014-10-30 DIAGNOSIS — I509 Heart failure, unspecified: Secondary | ICD-10-CM | POA: Insufficient documentation

## 2014-10-30 DIAGNOSIS — Z7951 Long term (current) use of inhaled steroids: Secondary | ICD-10-CM | POA: Diagnosis not present

## 2014-10-30 DIAGNOSIS — Z79899 Other long term (current) drug therapy: Secondary | ICD-10-CM | POA: Insufficient documentation

## 2014-10-30 DIAGNOSIS — Z043 Encounter for examination and observation following other accident: Secondary | ICD-10-CM | POA: Diagnosis not present

## 2014-10-30 DIAGNOSIS — Y9289 Other specified places as the place of occurrence of the external cause: Secondary | ICD-10-CM | POA: Insufficient documentation

## 2014-10-30 DIAGNOSIS — Z992 Dependence on renal dialysis: Secondary | ICD-10-CM | POA: Diagnosis not present

## 2014-10-30 DIAGNOSIS — J0110 Acute frontal sinusitis, unspecified: Secondary | ICD-10-CM | POA: Diagnosis not present

## 2014-10-30 DIAGNOSIS — I5033 Acute on chronic diastolic (congestive) heart failure: Secondary | ICD-10-CM | POA: Insufficient documentation

## 2014-10-30 DIAGNOSIS — R55 Syncope and collapse: Secondary | ICD-10-CM | POA: Diagnosis present

## 2014-10-30 LAB — COMPREHENSIVE METABOLIC PANEL
ALT: 18 U/L (ref 14–54)
ANION GAP: 12 (ref 5–15)
AST: 24 U/L (ref 15–41)
Albumin: 3.4 g/dL — ABNORMAL LOW (ref 3.5–5.0)
Alkaline Phosphatase: 91 U/L (ref 38–126)
BUN: 24 mg/dL — AB (ref 6–20)
CALCIUM: 8.7 mg/dL — AB (ref 8.9–10.3)
CO2: 26 mmol/L (ref 22–32)
Chloride: 97 mmol/L — ABNORMAL LOW (ref 101–111)
Creatinine, Ser: 5.22 mg/dL — ABNORMAL HIGH (ref 0.44–1.00)
GFR, EST AFRICAN AMERICAN: 9 mL/min — AB (ref >60)
GFR, EST NON AFRICAN AMERICAN: 8 mL/min — AB (ref >60)
Glucose, Bld: 152 mg/dL — ABNORMAL HIGH (ref 65–99)
Potassium: 3.2 mmol/L — ABNORMAL LOW (ref 3.5–5.1)
Sodium: 135 mmol/L (ref 135–145)
TOTAL PROTEIN: 6.2 g/dL — AB (ref 6.5–8.1)
Total Bilirubin: 0.6 mg/dL (ref 0.3–1.2)

## 2014-10-30 LAB — CBG MONITORING, ED: Glucose-Capillary: 93 mg/dL (ref 65–99)

## 2014-10-30 LAB — CBC WITH DIFFERENTIAL/PLATELET
BASOS PCT: 1 % (ref 0–1)
Basophils Absolute: 0.1 10*3/uL (ref 0.0–0.1)
EOS ABS: 0.1 10*3/uL (ref 0.0–0.7)
Eosinophils Relative: 3 % (ref 0–5)
HEMATOCRIT: 32.7 % — AB (ref 36.0–46.0)
Hemoglobin: 10.5 g/dL — ABNORMAL LOW (ref 12.0–15.0)
LYMPHS ABS: 1 10*3/uL (ref 0.7–4.0)
LYMPHS PCT: 19 % (ref 12–46)
MCH: 29 pg (ref 26.0–34.0)
MCHC: 32.1 g/dL (ref 30.0–36.0)
MCV: 90.3 fL (ref 78.0–100.0)
MONO ABS: 0.7 10*3/uL (ref 0.1–1.0)
Monocytes Relative: 14 % — ABNORMAL HIGH (ref 3–12)
NEUTROS PCT: 64 % (ref 43–77)
Neutro Abs: 3.4 10*3/uL (ref 1.7–7.7)
Platelets: 238 10*3/uL (ref 150–400)
RBC: 3.62 MIL/uL — ABNORMAL LOW (ref 3.87–5.11)
RDW: 15.7 % — AB (ref 11.5–15.5)
WBC: 5.4 10*3/uL (ref 4.0–10.5)

## 2014-10-30 MED ORDER — ONDANSETRON HCL 4 MG/2ML IJ SOLN
INTRAMUSCULAR | Status: AC
  Filled 2014-10-30: qty 2

## 2014-10-30 MED ORDER — AZITHROMYCIN 250 MG PO TABS
500.0000 mg | ORAL_TABLET | Freq: Once | ORAL | Status: AC
  Administered 2014-10-30: 500 mg via ORAL
  Filled 2014-10-30: qty 2

## 2014-10-30 MED ORDER — SODIUM CHLORIDE 0.9 % IV BOLUS (SEPSIS)
500.0000 mL | Freq: Once | INTRAVENOUS | Status: AC
  Administered 2014-10-30: 500 mL via INTRAVENOUS

## 2014-10-30 MED ORDER — CLONIDINE HCL 0.1 MG PO TABS
0.1000 mg | ORAL_TABLET | Freq: Once | ORAL | Status: AC
  Administered 2014-10-30: 0.1 mg via ORAL
  Filled 2014-10-30: qty 1

## 2014-10-30 MED ORDER — AZITHROMYCIN 250 MG PO TABS: ORAL_TABLET | ORAL | Status: DC

## 2014-10-30 MED ORDER — ONDANSETRON HCL 4 MG/2ML IJ SOLN
4.0000 mg | Freq: Once | INTRAMUSCULAR | Status: AC
  Administered 2014-10-30: 4 mg via INTRAMUSCULAR

## 2014-10-30 NOTE — ED Notes (Signed)
Family just arrived, upset over patient's blood pressure. Confromtal. Cook aware of patients status and bp

## 2014-10-30 NOTE — ED Notes (Signed)
Pt has fallen x3 today, pt co rt hand pain, pt had anti-anxiety pill at 0300, per family member the pt is SOB, and lethargic. The pt had dialysis yesterday, port on rt chest wall, old access in left arm.

## 2014-10-30 NOTE — ED Provider Notes (Signed)
CSN: [id number]     Arrival date & time 10/30/14  1556 History   First MD Initiated Contact with Patient 10/30/14 1608     Chief Complaint  Patient presents with  . Fall  . Near Syncope     (Consider location/radiation/quality/duration/timing/severity/associated sxs/prior Treatment) HPI .Marland Kitchen... Patient fell 2 today after slipping on dog urine. Patient has end-stage renal disease and is on dialysis Tuesday Thursday and Saturday. No prodromal illnesses. No fever sweats or chills. Patient has not taken her hypertensive medicine tonight.  Patient was initially feeling lethargic. No chest pain, dysuria. Questionable dyspnea.  Review of systems positive for right frontal sinus infection Past Medical History  Diagnosis Date  . Diabetes mellitus without complication   . Hypertension   . Anemia   . CKD (chronic kidney disease)   . CHF (congestive heart failure)   . Renal insufficiency   . Acute kidney injury 06/08/2014  . Acute on chronic diastolic heart failure [id number]  . Pulmonary hypertension 06/12/2014  . Atrial fibrillation with RVR 06/11/2014   Past Surgical History  Procedure Laterality Date  . Jugular cath Right Feb. 2, 2016    HD  . Av fistula placement Left 09/07/2014    Procedure: ARTERIOVENOUS (AV) FISTULA CREATION-LEFT;  Surgeon: Elam Dutch, MD;  Location: [facility] OR;  Service: Vascular;  Laterality: Left;   Family History  Problem Relation Age of Onset  . Aneurysm Mother     She fell and Hit head  . Diabetes Father   . Heart attack Father   . Heart disease Father     Before age 8   History  Substance Use Topics  . Smoking status: Never Smoker   . Smokeless tobacco: Never Used  . Alcohol Use: No   OB History    No data available     Review of Systems  All other systems reviewed and are negative.     Allergies  Morphine and related  Home Medications   Prior to Admission medications   Medication Sig Start Date End Date Taking? Authorizing Provider  aspirin  EC 81 MG tablet Take 81 mg by mouth at bedtime.    Yes Historical Provider, MD  calcium acetate (PHOSLO) 667 MG capsule Take 667-2,668 mg by mouth See admin instructions. Take four tablets at each meal and one with snacks   Yes Historical Provider, MD  diltiazem (CARDIZEM CD) 180 MG 24 hr capsule Take 1 capsule (180 mg total) by mouth daily. New medication for your blood pressure and heart rate. 06/14/14  Yes Rexene Alberts, MD  fexofenadine (ALLEGRA) 180 MG tablet Take 180 mg by mouth every morning.  05/01/14  Yes Historical Provider, MD  fluticasone (FLONASE) 50 MCG/ACT nasal spray Place 2 sprays into both nostrils daily. Patient taking differently: Place 2 sprays into both nostrils daily as needed for allergies.  04/25/14  Yes Merryl Hacker, MD  furosemide (LASIX) 40 MG tablet Take 120 mg by mouth daily.   Yes Historical Provider, MD  glimepiride (AMARYL) 2 MG tablet Take one-half tablet daily only if your blood sugar is greater than 160. Check your blood sugar daily. Patient taking differently: Take 1 mg by mouth daily as needed (if blood sugar is greater than 160). Check blood sugar daily. 06/14/14  Yes Rexene Alberts, MD  metoprolol (LOPRESSOR) 50 MG tablet Take 1 tablet (50 mg total) by mouth 2 (two) times daily. New medication for your blood pressure and heart rate. Patient taking differently: Take 50 mg by  mouth daily. New medication for your blood pressure and heart rate. 06/14/14  Yes Rexene Alberts, MD  NIFEdipine (ADALAT CC) 90 MG 24 hr tablet Take 90 mg by mouth daily. 10/05/14  Yes Historical Provider, MD  omeprazole (PRILOSEC) 20 MG capsule Take 1 capsule (20 mg total) by mouth daily. For gastric acid reflux. 06/14/14  Yes Rexene Alberts, MD  sodium chloride (OCEAN) 0.65 % SOLN nasal spray Place 1 spray into both nostrils as needed for congestion. 04/25/14  Yes Merryl Hacker, MD  Vitamin D, Ergocalciferol, (DRISDOL) 50000 UNITS CAPS capsule Take 1 capsule (50,000 Units total) by mouth  every 7 (seven) days. Patient taking differently: Take 50,000 Units by mouth every 7 (seven) days. On Thursday. 06/14/14  Yes Rexene Alberts, MD  zolpidem (AMBIEN) 5 MG tablet Take 5 mg by mouth at bedtime. 07/30/14  Yes Historical Provider, MD  azithromycin (ZITHROMAX) 250 MG tablet 1 tablet daily for 4 more days starting tomorrow evening 10/30/14   Nat Christen, MD  oxyCODONE-acetaminophen (PERCOCET/ROXICET) 5-325 MG per tablet Take 1 tablet by mouth every 6 (six) hours as needed. Patient not taking: Reported on 10/30/2014 09/07/14   Ulyses Amor, PA-C   BP 208/134 mmHg  Pulse 82  Temp(Src) 99.2 F (37.3 C) (Oral)  Resp 23  Ht 5\' 7"  (1.702 m)  Wt 180 lb (81.647 kg)  BMI 28.19 kg/m2  SpO2 92% Physical Exam  Constitutional: She is oriented to person, place, and time.  Sleepy, easily arousable, slightly dyspneic  HENT:  Head: Normocephalic and atraumatic.  Eyes: Conjunctivae and EOM are normal. Pupils are equal, round, and reactive to light.  Neck: Normal range of motion. Neck supple.  Cardiovascular: Normal rate and regular rhythm.   Pulmonary/Chest: Effort normal and breath sounds normal.  Abdominal: Soft. Bowel sounds are normal.  Musculoskeletal: Normal range of motion.  Neurological: She is alert and oriented to person, place, and time.  Skin: Skin is warm and dry.  Psychiatric: She has a normal mood and affect. Her behavior is normal.  Nursing note and vitals reviewed.   ED Course  Procedures (including critical care time) Labs Review Labs Reviewed  CBC WITH DIFFERENTIAL/PLATELET - Abnormal; Notable for the following:    RBC 3.62 (*)    Hemoglobin 10.5 (*)    HCT 32.7 (*)    RDW 15.7 (*)    Monocytes Relative 14 (*)    All other components within normal limits  COMPREHENSIVE METABOLIC PANEL - Abnormal; Notable for the following:    Potassium 3.2 (*)    Chloride 97 (*)    Glucose, Bld 152 (*)    BUN 24 (*)    Creatinine, Ser 5.22 (*)    Calcium 8.7 (*)    Total Protein  6.2 (*)    Albumin 3.4 (*)    GFR calc non Af Amer 8 (*)    GFR calc Af Amer 9 (*)    All other components within normal limits  CULTURE, BLOOD (ROUTINE X 2)  CULTURE, BLOOD (ROUTINE X 2)  URINALYSIS, ROUTINE W REFLEX MICROSCOPIC (NOT AT [facility])  CBG MONITORING, ED    Imaging Review Ct Head Wo Contrast  10/30/2014   CLINICAL DATA:  Status post fall  EXAM: CT HEAD WITHOUT CONTRAST  TECHNIQUE: Contiguous axial images were obtained from the base of the skull through the vertex without intravenous contrast.  COMPARISON:  None.  FINDINGS: There is patchy low attenuation within the subcortical and periventricular white matter compatible with microvascular disease.  Prominence of the sulci and ventricles identified consistent with brain atrophy. No evidence for acute brain infarct, hemorrhage or mass. No extra-axial fluid collections. The paranasal sinuses are clear. The mastoid air cells are also clear. The calvarium is intact.  IMPRESSION: 1. No acute intracranial abnormalities. 2. Small vessel ischemic change and brain atrophy.   Electronically Signed   By: Kerby Moors M.D.   On: 10/30/2014 18:40   Dg Chest Port 1 View  10/30/2014   CLINICAL DATA:  Pt fell 3 x's today, lost balance 2x's, 1 x she stated she slipped on urine from pet. Sob, lethargic, pt had dialysis yesterday. Productive cough x 2 weeks. History of CHF, Diabetes, HTN, CKD, A-FIB. Pt unable to raise chin further.  EXAM: PORTABLE CHEST - 1 VIEW  COMPARISON:  06/11/2014  FINDINGS: There is vascular congestion, right greater than left, with mild hazy opacity and interstitial thickening noted in the central left lung and in the right lung mostly in the right upper lobe. These findings are new.  No pneumothorax or convincing pleural effusion.  Cardiac silhouette is mildly enlarged. No mediastinal or hilar masses.  Right internal jugular dual-lumen central venous catheter has its distal port near the caval atrial junction.  IMPRESSION: 1. Right  greater than left vascular congestion areas of interstitial and airspace lung opacity. This may reflect asymmetric pulmonary edema or pneumonia.   Electronically Signed   By: Lajean Manes M.D.   On: 10/30/2014 18:26     EKG Interpretation   Date/Time:  Friday Oct 30 2014 16:56:57 EDT Ventricular Rate:  85 PR Interval:  143 QRS Duration: 83 QT Interval:  402 QTC Calculation: 478 R Axis:   20 Text Interpretation:  Sinus rhythm Multiple premature complexes, vent   Baseline wander in lead(s) V4 Confirmed by [facility] Zappia  MD, Jaclene Bartelt ([id number]) on  10/30/2014 5:00:43 PM      MDM   Final diagnoses:  ESRD (end stage renal disease)  Acute frontal sinusitis, recurrence not specified    Patient rechecked several times. CT head negative for acute findings. At discharge she was alert, good color, no obvious dyspnea. Blood pressure noted to be elevated. She had not taken her BP medication tonight. She was started on Zithromax for her right frontal sinusitis.  Patient will go to dialysis tomorrow.    Nat Christen, MD 10/30/14 [phone]

## 2014-10-30 NOTE — Progress Notes (Unsigned)
Clinical Summary Brenda Lee is a 64 y.o.female seen today for follow up of the following medical problems. This is a focused visit on her history of atrial fibrillation and potential initiation of anticoagulation.   1. Chronic diastolic heart failure - echo shows LVEF 60-65%, grade II diastolic dysfunction, she also has some mild mitral stenosis (mean grad 5) and pulmonary HTN, RV function is normal. - denies any breathing issues. Weight is stable at [phone] lbs. Decreaed LE edema since starting dialysis. Denies and SOB or DOE.    2. ESRD - followed by nephrlogogy, recent vascular procedure for long term access   3. Pulmonary HTN elevated PASP on echo at 74. Likely multifactorial including grade II diastolic dysfunction with active fluid overload, mild to moderate gradient across MV consistent with mild to mod mitral stenosis. - repeat echo after she had started regular HD shows PASP 53, normal RV function - stable LE edema, no significant SOB  4. Afib - new diagnosis during recent admission - on metoprolol for rate control. Severe anemia during admission requiring transfusion, she has not been started on anticoag.  - denies any palpitations since our last visit.   - since last visit hemoglobin has been stable. She had prior admission with anemia requiring 2 units of pRBCs, she refused GI workup at the time.    5. Anemia - transfused 2 units pRBCs during recent admit and received IV iron. Discharge Hgb 9.2. Followed by renal and pcp.  - she has not wanted EGD or colonoscopy  6. HTN - checking bp at home daily. Typically 140s/80s at home. Does have some white coat HTN.  - compliant with meds    Past Medical History  Diagnosis Date  . Diabetes mellitus without complication   . Hypertension   . Anemia   . CKD (chronic kidney disease)   . CHF (congestive heart failure)   . Renal insufficiency   . Acute kidney injury 06/08/2014  . Acute on chronic diastolic heart  failure [id number]  . Pulmonary hypertension 06/12/2014  . Atrial fibrillation with RVR 06/11/2014     Allergies  Allergen Reactions  . Morphine And Related Nausea And Vomiting     Current Outpatient Prescriptions  Medication Sig Dispense Refill  . aspirin EC 81 MG tablet Take 81 mg by mouth daily.    . calcium acetate (PHOSLO) 667 MG capsule Take 667 mg by mouth. Take four tablets at each meal and one with snacks    . diltiazem (CARDIZEM CD) 180 MG 24 hr capsule Take 1 capsule (180 mg total) by mouth daily. New medication for your blood pressure and heart rate. 30 capsule 3  . fexofenadine (ALLEGRA) 180 MG tablet Take 180 mg by mouth daily.  0  . fluticasone (FLONASE) 50 MCG/ACT nasal spray Place 2 sprays into both nostrils daily. (Patient taking differently: Place 2 sprays into both nostrils daily as needed for allergies. ) 16 g 2  . furosemide (LASIX) 40 MG tablet Take 120 mg by mouth daily.    Marland Kitchen glimepiride (AMARYL) 2 MG tablet Take one-half tablet daily only if your blood sugar is greater than 160. Check your blood sugar daily. (Patient taking differently: Take 1 mg by mouth daily as needed (if blood sugar is greater than 160). Check blood sugar daily.)    . metoprolol (LOPRESSOR) 50 MG tablet Take 1 tablet (50 mg total) by mouth 2 (two) times daily. New medication for your blood pressure and heart rate. (Patient taking  differently: Take 50 mg by mouth daily. New medication for your blood pressure and heart rate.) 60 tablet 3  . omeprazole (PRILOSEC) 20 MG capsule Take 1 capsule (20 mg total) by mouth daily. For gastric acid reflux. 30 capsule 1.  . oxyCODONE-acetaminophen (PERCOCET/ROXICET) 5-325 MG per tablet Take 1 tablet by mouth every 6 (six) hours as needed. 30 tablet 0  . sodium chloride (OCEAN) 0.65 % SOLN nasal spray Place 1 spray into both nostrils as needed for congestion. 480 mL 0  . Vitamin D, Ergocalciferol, (DRISDOL) 50000 UNITS CAPS capsule Take 1 capsule (50,000 Units total)  by mouth every 7 (seven) days. (Patient taking differently: Take 50,000 Units by mouth every 7 (seven) days. On Thursday.) 30 capsule 3  . zolpidem (AMBIEN) 5 MG tablet Take 5 mg by mouth at bedtime.  0   No current facility-administered medications for this visit.     Past Surgical History  Procedure Laterality Date  . Jugular cath Right Feb. 2, 2016    HD  . Av fistula placement Left 09/07/2014    Procedure: ARTERIOVENOUS (AV) FISTULA CREATION-LEFT;  Surgeon: Brenda Dutch, MD;  Location: [facility];  Service: Vascular;  Laterality: Left;     Allergies  Allergen Reactions  . Morphine And Related Nausea And Vomiting      Family History  Problem Relation Age of Onset  . Aneurysm Mother     She fell and Hit head  . Diabetes Father   . Heart attack Father   . Heart disease Father     Before age 41     Social History Brenda Lee reports that she has never smoked. She has never used smokeless tobacco. Brenda Lee reports that she does not drink alcohol.   Review of Systems CONSTITUTIONAL: No weight loss, fever, chills, weakness or fatigue.  HEENT: Eyes: No visual loss, blurred vision, double vision or yellow sclerae.No hearing loss, sneezing, congestion, runny nose or sore throat.  SKIN: No rash or itching.  CARDIOVASCULAR:  RESPIRATORY: No shortness of breath, cough or sputum.  GASTROINTESTINAL: No anorexia, nausea, vomiting or diarrhea. No abdominal pain or blood.  GENITOURINARY: No burning on urination, no polyuria NEUROLOGICAL: No headache, dizziness, syncope, paralysis, ataxia, numbness or tingling in the extremities. No change in bowel or bladder control.  MUSCULOSKELETAL: No muscle, back pain, joint pain or stiffness.  LYMPHATICS: No enlarged nodes. No history of splenectomy.  PSYCHIATRIC: No history of depression or anxiety.  ENDOCRINOLOGIC: No reports of sweating, cold or heat intolerance. No polyuria or polydipsia.  Marland Kitchen   Physical Examination There were no vitals  filed for this visit. There were no vitals filed for this visit.  Gen: resting comfortably, no acute distress HEENT: no scleral icterus, pupils equal round and reactive, no palptable cervical adenopathy,  CV Resp: Clear to auscultation bilaterally GI: abdomen is soft, non-tender, non-distended, normal bowel sounds, no hepatosplenomegaly MSK: extremities are warm, no edema.  Skin: warm, no rash Neuro:  no focal deficits Psych: appropriate affect   Diagnostic Studies  Jan 2016 Echo Study Conclusions  - Left ventricle: The cavity size was normal. Wall thickness was normal. Systolic function was normal. The estimated ejection fraction was in the range of 60% to 65%. Wall motion was normal; there were no regional wall motion abnormalities. Features are consistent with a pseudonormal left ventricular filling pattern, with concomitant abnormal relaxation and increased filling pressure (grade 2 diastolic dysfunction). - Aortic valve: Moderately calcified annulus. Trileaflet; moderately thickened leaflets. - Mitral valve:  Mildly to moderately calcified annulus. Mildly thickened leaflets . The findings are consistent with mild to moderate stenosis. There was mild regurgitation. Mean gradient (D): 5 mm Hg. - Left atrium: The atrium was severely dilated. - Right ventricle: The cavity size was moderately dilated. It shares the apex with the LV. Systolic function was normal. RV TAPSe is 2.0 cm. - Right atrium: The atrium was moderately dilated. - Pulmonary arteries: Systolic pressure was severely increased. PA peak pressure: 74 mm Hg (S). - Technically difficult study.   06/2014 VQ scan Perfusion: Small subsegmental defect is noted and posterior segment of right lower lobe which matches abnormality described on ventilation exam. No other significant defects are noted.  IMPRESSION: Low probability of pulmonary embolus.   08/2014 Echo Study  Conclusions  - Left ventricle: The cavity size was normal. Wall thickness was increased in a pattern of moderate LVH. Systolic function was normal. The estimated ejection fraction was in the range of 55% to 60%. Diastolic function is abnormal, indeterminate grade. - Aortic valve: Mildly calcified annulus. Trileaflet; mildly thickened leaflets. Valve area (VTI): 2.07 cm^2. Valve area (Vmax): 2.04 cm^2. Valve area (Vmean): 2.25 cm^2. - Mitral valve: Mildly calcified annulus. Mildly thickened leaflets . There was mild regurgitation. - Left atrium: The atrium was mildly dilated. - Right atrium: The atrium was mildly dilated. - Pulmonary arteries: Systolic pressure was moderately increased. PA peak pressure: 53 mm Hg (S). PASP may be underestimated based on available TR spectral Doppler waveform. - Technically adequate study.    Assessment and Plan  1. Chronic diastolic hear failure - denies current symptoms, fluid removal priamily by HD though she does make some urine and is on lasix - continue current meds  2. ESRD - HD per renal   3. Pulmonary HTN - PASP by echo since starting HD and controlling volume, suspect primarily due to her left sided dysfunction - continue HD and diuretics - ANA negative, VQ scan negative  4. Afib - continue rate control - significant anemia requriing recent transfusion during recent admission. Hgb has been stable since discharge, discussed starting anticoag. - will request most recent labs from her dialysis center [facility], if stable Hgb then plan to refer to our coumadin clinic  5. HTN - elevated in clinic, she reports white coat HTN and also has not taken her meds yet today. Home numbers at goal per report - will provide bp log to bring prior to her upcoming medical visit       Arnoldo Lenis, M.D., F.A.C.C.

## 2014-10-30 NOTE — ED Notes (Addendum)
Pt's resp slow at times. Then rapid resp. drowsy, but easily roused.    ?cheyne stokes resp  spo2 at 89. Placed on 2L 02

## 2014-10-30 NOTE — Discharge Instructions (Signed)
Start anti-biotic prescription tomorrow night. Make sure to go to dialysis tomorrow.

## 2014-11-04 LAB — CULTURE, BLOOD (ROUTINE X 2)
Culture: NO GROWTH
Culture: NO GROWTH

## 2014-11-09 ENCOUNTER — Encounter: Payer: Self-pay | Admitting: Vascular Surgery

## 2014-11-12 ENCOUNTER — Encounter: Payer: Self-pay | Admitting: Vascular Surgery

## 2014-11-12 ENCOUNTER — Ambulatory Visit (HOSPITAL_COMMUNITY)
Admission: RE | Admit: 2014-11-12 | Discharge: 2014-11-12 | Disposition: A | Discharge status: Home / Self Care | Payer: Medicare Other | Source: Ambulatory Visit | Attending: Vascular Surgery | Admitting: Vascular Surgery

## 2014-11-12 ENCOUNTER — Ambulatory Visit (INDEPENDENT_AMBULATORY_CARE_PROVIDER_SITE_OTHER): Payer: Self-pay | Admitting: Vascular Surgery

## 2014-11-12 VITALS — BP 140/76 | HR 88 | Resp 16 | Ht 67.0 in | Wt 166.0 lb

## 2014-11-12 DIAGNOSIS — N186 End stage renal disease: Secondary | ICD-10-CM

## 2014-11-12 DIAGNOSIS — Z4931 Encounter for adequacy testing for hemodialysis: Secondary | ICD-10-CM

## 2014-11-12 DIAGNOSIS — Z992 Dependence on renal dialysis: Secondary | ICD-10-CM | POA: Diagnosis not present

## 2014-11-12 NOTE — Progress Notes (Signed)
Patient is a 64 year old female who returns today for follow-up after placement of a left radiocephalic AV fistula on [id number]. She denies any symptoms of steal. She is currently dialyzing via a right-sided Diatek catheter Tuesday Thursday Saturday.  Physical exam:  Filed Vitals:   11/12/14 1437 11/12/14 1438  BP: 143/77 140/76  Pulse: 96 88  Resp: 16   Height: 5\' 7"  (1.702 m)   Weight: 75.297 kg (166 lb)     Left upper extremity: Palpable thrill in fistula incision well-healed fistulous palpable throughout the mid forearm  Data: Duplex ultrasound of AV fistula was performed today. Fistula diameter is 6 mm and less than 6 mm in depth throughout its course  Assessment: Maturing left arm AV fistula  Plan: Fistula should be ready to cannulate in early July. Patient will follow-up on an as-needed basis.  Ruta Hinds, MD [facility] Office: [phone] Pager: [phone]

## 2014-11-24 ENCOUNTER — Encounter: Payer: Self-pay | Admitting: Cardiology

## 2014-11-24 ENCOUNTER — Ambulatory Visit (INDEPENDENT_AMBULATORY_CARE_PROVIDER_SITE_OTHER): Payer: Medicare Other | Admitting: Cardiology

## 2014-11-24 VITALS — BP 124/55 | HR 73 | Ht 67.0 in | Wt 164.8 lb

## 2014-11-24 DIAGNOSIS — I4891 Unspecified atrial fibrillation: Secondary | ICD-10-CM

## 2014-11-24 NOTE — Progress Notes (Signed)
Clinical Summary Brenda Lee is a 64 y.o.female seen today for follow up of the following medical problems. This is a focused visit to discuss potential anticoagulation in the setting of afib. For more detailed clinic visit refer to prior notes.   1. Afib - fairly new diagnosis during recent admission - on metoprolol for rate control. Severe anemia during admission requiring transfusion, she has not been started on anticoag. Since discharge blood counts have been stable. We discussed indications for anticoagulation in depth last visit, however she elected not to start at that time - since last visit she remains reluctant to start anticoagulation.  - denies any palpitations since our last visit.    Past Medical History  Diagnosis Date  . Diabetes mellitus without complication   . Hypertension   . Anemia   . CKD (chronic kidney disease)   . CHF (congestive heart failure)   . Renal insufficiency   . Acute kidney injury 06/08/2014  . Acute on chronic diastolic heart failure [id number]  . Pulmonary hypertension 06/12/2014  . Atrial fibrillation with RVR 06/11/2014     Allergies  Allergen Reactions  . Morphine And Related Nausea And Vomiting     Current Outpatient Prescriptions  Medication Sig Dispense Refill  . aspirin EC 81 MG tablet Take 81 mg by mouth at bedtime.     Marland Kitchen atorvastatin (LIPITOR) 10 MG tablet Take 10 mg by mouth daily. At bedtime    . azithromycin (ZITHROMAX) 250 MG tablet 1 tablet daily for 4 more days starting tomorrow evening (Patient not taking: Reported on 11/12/2014) 4 each 0  . calcium acetate (PHOSLO) 667 MG capsule Take 667-2,668 mg by mouth See admin instructions. Take four tablets at each meal and one with snacks    . diltiazem (CARDIZEM CD) 180 MG 24 hr capsule Take 1 capsule (180 mg total) by mouth daily. New medication for your blood pressure and heart rate. (Patient not taking: Reported on 11/12/2014) 30 capsule 3  . fexofenadine (ALLEGRA) 180 MG tablet Take  180 mg by mouth every morning.   0  . fluticasone (FLONASE) 50 MCG/ACT nasal spray Place 2 sprays into both nostrils daily. 16 g 2  . furosemide (LASIX) 40 MG tablet Take 120 mg by mouth daily.    Marland Kitchen glimepiride (AMARYL) 2 MG tablet Take one-half tablet daily only if your blood sugar is greater than 160. Check your blood sugar daily. (Patient taking differently: Take 1 mg by mouth daily as needed (if blood sugar is greater than 160). Check blood sugar daily.)    . metoprolol (LOPRESSOR) 50 MG tablet Take 1 tablet (50 mg total) by mouth 2 (two) times daily. New medication for your blood pressure and heart rate. (Patient taking differently: Take 50 mg by mouth daily. New medication for your blood pressure and heart rate.) 60 tablet 3  . NIFEdipine (ADALAT CC) 90 MG 24 hr tablet Take 90 mg by mouth daily.  5  . omeprazole (PRILOSEC) 20 MG capsule Take 1 capsule (20 mg total) by mouth daily. For gastric acid reflux. 30 capsule 1.  . oxyCODONE-acetaminophen (PERCOCET/ROXICET) 5-325 MG per tablet Take 1 tablet by mouth every 6 (six) hours as needed. (Patient not taking: Reported on 10/30/2014) 30 tablet 0  . sodium chloride (OCEAN) 0.65 % SOLN nasal spray Place 1 spray into both nostrils as needed for congestion. 480 mL 0  . Vitamin D, Ergocalciferol, (DRISDOL) 50000 UNITS CAPS capsule Take 1 capsule (50,000 Units total) by mouth  every 7 (seven) days. (Patient taking differently: Take 50,000 Units by mouth every 7 (seven) days. On Thursday.) 30 capsule 3  . zolpidem (AMBIEN) 5 MG tablet Take 5 mg by mouth at bedtime.  0   No current facility-administered medications for this visit.     Past Surgical History  Procedure Laterality Date  . Jugular cath Right Feb. 2, 2016    HD  . Av fistula placement Left 09/07/2014    Procedure: ARTERIOVENOUS (AV) FISTULA CREATION-LEFT;  Surgeon: Elam Dutch, MD;  Location: [facility];  Service: Vascular;  Laterality: Left;     Allergies  Allergen Reactions  .  Morphine And Related Nausea And Vomiting      Family History  Problem Relation Age of Onset  . Aneurysm Mother     She fell and Hit head  . Diabetes Father   . Heart attack Father   . Heart disease Father     Before age 42     Social History Brenda Lee reports that she has never smoked. She has never used smokeless tobacco. Brenda Lee reports that she does not drink alcohol.   Review of Systems CONSTITUTIONAL: No weight loss, fever, chills, weakness or fatigue.  HEENT: Eyes: No visual loss, blurred vision, double vision or yellow sclerae.No hearing loss, sneezing, congestion, runny nose or sore throat.  SKIN: No rash or itching.  CARDIOVASCULAR: per HPI RESPIRATORY: No shortness of breath, cough or sputum.  GASTROINTESTINAL: No anorexia, nausea, vomiting or diarrhea. No abdominal pain or blood.  GENITOURINARY: No burning on urination, no polyuria NEUROLOGICAL: No headache, dizziness, syncope, paralysis, ataxia, numbness or tingling in the extremities. No change in bowel or bladder control.  MUSCULOSKELETAL: No muscle, back pain, joint pain or stiffness.  LYMPHATICS: No enlarged nodes. No history of splenectomy.  PSYCHIATRIC: No history of depression or anxiety.  ENDOCRINOLOGIC: No reports of sweating, cold or heat intolerance. No polyuria or polydipsia.  Marland Kitchen   Physical Examination Filed Vitals:   11/24/14 1620  BP: 124/55  Pulse: 73   Filed Vitals:   11/24/14 1620  Height: 5\' 7"  (1.702 m)  Weight: 164 lb 12.8 oz (74.753 kg)    Gen: resting comfortably, no acute distress HEENT: no scleral icterus, pupils equal round and reactive, no palptable cervical adenopathy,  CV: irreg, no m/r/g, no JVD Resp: Clear to auscultation bilaterally GI: abdomen is soft, non-tender, non-distended, normal bowel sounds, no hepatosplenomegaly MSK: extremities are warm, no edema.  Skin: warm, no rash Neuro:  no focal deficits Psych: appropriate affect   Diagnostic Studies Jan 2016  Echo Study Conclusions  - Left ventricle: The cavity size was normal. Wall thickness was normal. Systolic function was normal. The estimated ejection fraction was in the range of 60% to 65%. Wall motion was normal; there were no regional wall motion abnormalities. Features are consistent with a pseudonormal left ventricular filling pattern, with concomitant abnormal relaxation and increased filling pressure (grade 2 diastolic dysfunction). - Aortic valve: Moderately calcified annulus. Trileaflet; moderately thickened leaflets. - Mitral valve: Mildly to moderately calcified annulus. Mildly thickened leaflets . The findings are consistent with mild to moderate stenosis. There was mild regurgitation. Mean gradient (D): 5 mm Hg. - Left atrium: The atrium was severely dilated. - Right ventricle: The cavity size was moderately dilated. It shares the apex with the LV. Systolic function was normal. RV TAPSe is 2.0 cm. - Right atrium: The atrium was moderately dilated. - Pulmonary arteries: Systolic pressure was severely increased. PA peak pressure:  74 mm Hg (S). - Technically difficult study.   06/2014 VQ scan Perfusion: Small subsegmental defect is noted and posterior segment of right lower lobe which matches abnormality described on ventilation exam. No other significant defects are noted.  IMPRESSION: Low probability of pulmonary embolus.   08/2014 Echo Study Conclusions  - Left ventricle: The cavity size was normal. Wall thickness was increased in a pattern of moderate LVH. Systolic function was normal. The estimated ejection fraction was in the range of 55% to 60%. Diastolic function is abnormal, indeterminate grade. - Aortic valve: Mildly calcified annulus. Trileaflet; mildly thickened leaflets. Valve area (VTI): 2.07 cm^2. Valve area (Vmax): 2.04 cm^2. Valve area (Vmean): 2.25 cm^2. - Mitral valve: Mildly calcified annulus. Mildly  thickened leaflets . There was mild regurgitation. - Left atrium: The atrium was mildly dilated. - Right atrium: The atrium was mildly dilated. - Pulmonary arteries: Systolic pressure was moderately increased. PA peak pressure: 53 mm Hg (S). PASP may be underestimated based on available TR spectral Doppler waveform. - Technically adequate study.     Assessment and Plan  1. Afib - no palpitations, continue rate control - CHADS2Vasc score is 3, she remains reluctant to start anticoagulation despite discussing in detail again today her risk of stroke. Given her ESRD options are limited as far as agents, coumadin would be the best option, though eliquis could also be considered - she elects to continue to hold off on anticoagluation and will contact our office if her mind changes. Will continue ASA   F/u 4 months     Arnoldo Lenis, M.D.

## 2014-11-24 NOTE — Patient Instructions (Signed)
Your physician wants you to follow-up in: 4 months with Dr. Branch  You will receive a reminder letter in the mail two months in advance. If you don't receive a letter, please call our office to schedule the follow-up appointment.  Your physician recommends that you continue on your current medications as directed. Please refer to the Current Medication list given to you today.  Thank you for choosing [facility]!!    

## 2015-01-07 ENCOUNTER — Ambulatory Visit: Payer: Medicare Other | Admitting: Cardiology

## 2015-01-12 ENCOUNTER — Other Ambulatory Visit (HOSPITAL_COMMUNITY): Payer: Self-pay | Admitting: Nephrology

## 2015-01-12 DIAGNOSIS — Z992 Dependence on renal dialysis: Principal | ICD-10-CM

## 2015-01-12 DIAGNOSIS — N186 End stage renal disease: Secondary | ICD-10-CM

## 2015-01-13 ENCOUNTER — Ambulatory Visit: Payer: Medicare Other | Admitting: Cardiology

## 2015-01-15 ENCOUNTER — Ambulatory Visit (HOSPITAL_COMMUNITY)
Admission: RE | Admit: 2015-01-15 | Discharge: 2015-01-15 | Disposition: A | Discharge status: Home / Self Care | Payer: Medicare Other | Source: Ambulatory Visit | Attending: Nephrology | Admitting: Nephrology

## 2015-01-15 DIAGNOSIS — Z992 Dependence on renal dialysis: Secondary | ICD-10-CM

## 2015-01-15 DIAGNOSIS — Z452 Encounter for adjustment and management of vascular access device: Secondary | ICD-10-CM | POA: Diagnosis present

## 2015-01-15 DIAGNOSIS — N186 End stage renal disease: Secondary | ICD-10-CM

## 2015-01-15 MED ORDER — LIDOCAINE HCL 1 % IJ SOLN
INTRAMUSCULAR | Status: AC
Start: 2015-01-15 — End: 2015-01-15
  Filled 2015-01-15: qty 20

## 2015-01-15 MED ORDER — CHLORHEXIDINE GLUCONATE 4 % EX LIQD
CUTANEOUS | Status: AC
Start: 2015-01-15 — End: 2015-01-15
  Filled 2015-01-15: qty 15

## 2015-04-02 ENCOUNTER — Ambulatory Visit: Payer: Medicare Other | Admitting: Cardiology

## 2015-04-23 ENCOUNTER — Encounter: Payer: Self-pay | Admitting: Cardiology

## 2015-04-23 ENCOUNTER — Ambulatory Visit (INDEPENDENT_AMBULATORY_CARE_PROVIDER_SITE_OTHER): Payer: Medicare Other | Admitting: Cardiology

## 2015-04-23 VITALS — BP 151/66 | HR 90 | Ht 67.0 in | Wt 166.4 lb

## 2015-04-23 DIAGNOSIS — I4891 Unspecified atrial fibrillation: Secondary | ICD-10-CM | POA: Diagnosis not present

## 2015-04-23 DIAGNOSIS — I5032 Chronic diastolic (congestive) heart failure: Secondary | ICD-10-CM | POA: Diagnosis not present

## 2015-04-23 DIAGNOSIS — I272 Other secondary pulmonary hypertension: Secondary | ICD-10-CM

## 2015-04-23 DIAGNOSIS — I10 Essential (primary) hypertension: Secondary | ICD-10-CM

## 2015-04-23 NOTE — Progress Notes (Signed)
Patient ID: ALICEMAE ROED, female   DOB: Sep 09, 1950, 64 y.o.   MRN: [id number]     Clinical Summary Ms. Angelillo is a 64 y.o.female seen today for follow up of the following medical problems.   1. Afib - on metoprolol for rate control.  - initially was not started on anticoag due to history of severe anemia requiring transfusion. Anemia has stabilized, she however has not wanted to start anticoag.   - denies any palpitations since our last visit.   2. Chronic diastolic heart failure - echo shows LVEF 60-65%, grade II diastolic dysfunction, she also has some mild mitral stenosis (mean grad 5) and pulmonary HTN, RV function is normal. - denies any LE edema. Denies any SOB or DOE  3. ESRD - followed by nephrlogogy, recent vascular procedure for long term access - T, Th, Sat she has HD.   4. Pulmonary HTN elevated PASP on echo at 74. Likely multifactorial including grade II diastolic dysfunction with active fluid overload, mild to moderate gradient across MV consistent with mild to mod mitral stenosis. - repeat echo after she had started regular HD shows PASP 53, normal RV function  5. HTN - checking bp at home daily. Typically 140s/80s at home. Does have some white coat HTN.  - compliant with meds - can have some low bp's during dialysis.  Past Medical History  Diagnosis Date  . Diabetes mellitus without complication   . Hypertension   . Anemia   . CKD (chronic kidney disease)   . CHF (congestive heart failure)   . Renal insufficiency   . Acute kidney injury 06/08/2014  . Acute on chronic diastolic heart failure [id number]  . Pulmonary hypertension 06/12/2014  . Atrial fibrillation with RVR 06/11/2014     Allergies  Allergen Reactions  . Morphine And Related Nausea And Vomiting     Current Outpatient Prescriptions  Medication Sig Dispense Refill  . aspirin EC 81 MG tablet Take 81 mg by mouth at bedtime.     Marland Kitchen atorvastatin (LIPITOR) 10 MG tablet Take 10 mg by mouth daily. At  bedtime    . calcium acetate (PHOSLO) 667 MG capsule Take 667-2,668 mg by mouth See admin instructions. Take four tablets at each meal and one with snacks    . diltiazem (CARDIZEM CD) 180 MG 24 hr capsule Take 1 capsule (180 mg total) by mouth daily. New medication for your blood pressure and heart rate. 30 capsule 3  . fexofenadine (ALLEGRA) 180 MG tablet Take 180 mg by mouth every morning.   0  . fluticasone (FLONASE) 50 MCG/ACT nasal spray Place 2 sprays into both nostrils daily. 16 g 2  . furosemide (LASIX) 40 MG tablet Take 120 mg by mouth daily.    Marland Kitchen glimepiride (AMARYL) 2 MG tablet Take one-half tablet daily only if your blood sugar is greater than 160. Check your blood sugar daily. (Patient taking differently: Take 1 mg by mouth daily as needed (if blood sugar is greater than 160). Check blood sugar daily.)    . metoprolol (LOPRESSOR) 50 MG tablet Take 1 tablet (50 mg total) by mouth 2 (two) times daily. New medication for your blood pressure and heart rate. (Patient taking differently: Take 50 mg by mouth daily. New medication for your blood pressure and heart rate.) 60 tablet 3  . NIFEdipine (ADALAT CC) 90 MG 24 hr tablet Take 90 mg by mouth daily.  5  . omeprazole (PRILOSEC) 20 MG capsule Take 1 capsule (20 mg total)  by mouth daily. For gastric acid reflux. 30 capsule 1.  . oxyCODONE-acetaminophen (PERCOCET/ROXICET) 5-325 MG per tablet Take 1 tablet by mouth every 6 (six) hours as needed. 30 tablet 0  . sodium chloride (OCEAN) 0.65 % SOLN nasal spray Place 1 spray into both nostrils as needed for congestion. 480 mL 0  . Vitamin D, Ergocalciferol, (DRISDOL) 50000 UNITS CAPS capsule Take 1 capsule (50,000 Units total) by mouth every 7 (seven) days. (Patient taking differently: Take 50,000 Units by mouth every 7 (seven) days. On Thursday.) 30 capsule 3  . zolpidem (AMBIEN) 5 MG tablet Take 5 mg by mouth at bedtime.  0   No current facility-administered medications for this visit.     Past  Surgical History  Procedure Laterality Date  . Jugular cath Right Feb. 2, 2016    HD  . Av fistula placement Left 09/07/2014    Procedure: ARTERIOVENOUS (AV) FISTULA CREATION-LEFT;  Surgeon: Elam Dutch, MD;  Location: [facility];  Service: Vascular;  Laterality: Left;     Allergies  Allergen Reactions  . Morphine And Related Nausea And Vomiting      Family History  Problem Relation Age of Onset  . Aneurysm Mother     She fell and Hit head  . Diabetes Father   . Heart attack Father   . Heart disease Father     Before age 37     Social History Ms. Menear reports that she has never smoked. She has never used smokeless tobacco. Ms. Billings reports that she does not drink alcohol.   Review of Systems CONSTITUTIONAL: No weight loss, fever, chills, weakness or fatigue.  HEENT: Eyes: No visual loss, blurred vision, double vision or yellow sclerae.No hearing loss, sneezing, congestion, runny nose or sore throat.  SKIN: No rash or itching.  CARDIOVASCULAR: per hpi RESPIRATORY: No shortness of breath, cough or sputum.  GASTROINTESTINAL: No anorexia, nausea, vomiting or diarrhea. No abdominal pain or blood.  GENITOURINARY: No burning on urination, no polyuria NEUROLOGICAL: No headache, dizziness, syncope, paralysis, ataxia, numbness or tingling in the extremities. No change in bowel or bladder control.  MUSCULOSKELETAL: No muscle, back pain, joint pain or stiffness.  LYMPHATICS: No enlarged nodes. No history of splenectomy.  PSYCHIATRIC: No history of depression or anxiety.  ENDOCRINOLOGIC: No reports of sweating, cold or heat intolerance. No polyuria or polydipsia.  Marland Kitchen   Physical Examination Filed Vitals:   04/23/15 1542  BP: 151/66  Pulse: 90   Filed Vitals:   04/23/15 1542  Height: 5\' 7"  (1.702 m)  Weight: 166 lb 6.4 oz (75.479 kg)    Gen: resting comfortably, no acute distress HEENT: no scleral icterus, pupils equal round and reactive, no palptable cervical  adenopathy,  CV: RRR, 2/6 sysotlic murmur at apex, no jvd Resp: Clear to auscultation bilaterally GI: abdomen is soft, non-tender, non-distended, normal bowel sounds, no hepatosplenomegaly MSK: extremities are warm, no edema.  Skin: warm, no rash Neuro:  no focal deficits Psych: appropriate affect   Diagnostic Studies  Jan 2016 Echo Study Conclusions  - Left ventricle: The cavity size was normal. Wall thickness was normal. Systolic function was normal. The estimated ejection fraction was in the range of 60% to 65%. Wall motion was normal; there were no regional wall motion abnormalities. Features are consistent with a pseudonormal left ventricular filling pattern, with concomitant abnormal relaxation and increased filling pressure (grade 2 diastolic dysfunction). - Aortic valve: Moderately calcified annulus. Trileaflet; moderately thickened leaflets. - Mitral valve: Mildly to moderately  calcified annulus. Mildly thickened leaflets . The findings are consistent with mild to moderate stenosis. There was mild regurgitation. Mean gradient (D): 5 mm Hg. - Left atrium: The atrium was severely dilated. - Right ventricle: The cavity size was moderately dilated. It shares the apex with the LV. Systolic function was normal. RV TAPSe is 2.0 cm. - Right atrium: The atrium was moderately dilated. - Pulmonary arteries: Systolic pressure was severely increased. PA peak pressure: 74 mm Hg (S). - Technically difficult study.   06/2014 VQ scan Perfusion: Small subsegmental defect is noted and posterior segment of right lower lobe which matches abnormality described on ventilation exam. No other significant defects are noted.  IMPRESSION: Low probability of pulmonary embolus.   08/2014 Echo Study Conclusions  - Left ventricle: The cavity size was normal. Wall thickness was increased in a pattern of moderate LVH. Systolic function was normal. The estimated  ejection fraction was in the range of 55% to 60%. Diastolic function is abnormal, indeterminate grade. - Aortic valve: Mildly calcified annulus. Trileaflet; mildly thickened leaflets. Valve area (VTI): 2.07 cm^2. Valve area (Vmax): 2.04 cm^2. Valve area (Vmean): 2.25 cm^2. - Mitral valve: Mildly calcified annulus. Mildly thickened leaflets . There was mild regurgitation. - Left atrium: The atrium was mildly dilated. - Right atrium: The atrium was mildly dilated. - Pulmonary arteries: Systolic pressure was moderately increased. PA peak pressure: 53 mm Hg (S). PASP may be underestimated based on available TR spectral Doppler waveform. - Technically adequate study.       Assessment and Plan  1. Afib - no palpitations, continue metoprolol for rate control - CHADS2Vasc score is 3, she elects to continue to hold off on anticoagluation. We will continue ASA.   2. Chronic diastolic hear failure -  fluid removal mainly by HD though she does make some urine and is on lasix - continue current meds  3. ESRD - HD per renal   4. Pulmonary HTN - PASP by echo has decreased since starting HD and controlling volume, suspect primarily due to her left sided dysfunction - continue HD and diuretics - ANA negative, VQ scan negative  5. HTN - elevated in clinic, she reports white coat HTN and issues with low bp's during HD - no med changes  F/u 6 months      Arnoldo Lenis, M.D.

## 2015-04-23 NOTE — Patient Instructions (Signed)
Your physician wants you to follow-up in: 6 months with Dr. Bryna Colander will receive a reminder letter in the mail two months in advance. If you don't receive a letter, please call our office to schedule the follow-up appointment.  Your physician has recommended you make the following change in your medication:   START ASPIRIN 81 MG DAILY  PLEASE CALL us WHEN YOU VERIFY IF YOU ARE TAKING METOPROLOL   Thank you for choosing [facility]!!

## 2015-06-08 DIAGNOSIS — N2581 Secondary hyperparathyroidism of renal origin: Secondary | ICD-10-CM | POA: Diagnosis not present

## 2015-06-08 DIAGNOSIS — E11649 Type 2 diabetes mellitus with hypoglycemia without coma: Secondary | ICD-10-CM | POA: Diagnosis not present

## 2015-06-08 DIAGNOSIS — Z992 Dependence on renal dialysis: Secondary | ICD-10-CM | POA: Diagnosis not present

## 2015-06-08 DIAGNOSIS — N186 End stage renal disease: Secondary | ICD-10-CM | POA: Diagnosis not present

## 2015-06-08 DIAGNOSIS — Z794 Long term (current) use of insulin: Secondary | ICD-10-CM | POA: Diagnosis not present

## 2015-06-08 DIAGNOSIS — D631 Anemia in chronic kidney disease: Secondary | ICD-10-CM | POA: Diagnosis not present

## 2015-06-08 DIAGNOSIS — E162 Hypoglycemia, unspecified: Secondary | ICD-10-CM | POA: Diagnosis not present

## 2015-06-08 DIAGNOSIS — D509 Iron deficiency anemia, unspecified: Secondary | ICD-10-CM | POA: Diagnosis not present

## 2015-06-10 DIAGNOSIS — Z992 Dependence on renal dialysis: Secondary | ICD-10-CM | POA: Diagnosis not present

## 2015-06-10 DIAGNOSIS — E162 Hypoglycemia, unspecified: Secondary | ICD-10-CM | POA: Diagnosis not present

## 2015-06-10 DIAGNOSIS — D509 Iron deficiency anemia, unspecified: Secondary | ICD-10-CM | POA: Diagnosis not present

## 2015-06-10 DIAGNOSIS — N186 End stage renal disease: Secondary | ICD-10-CM | POA: Diagnosis not present

## 2015-06-10 DIAGNOSIS — N2581 Secondary hyperparathyroidism of renal origin: Secondary | ICD-10-CM | POA: Diagnosis not present

## 2015-06-10 DIAGNOSIS — D631 Anemia in chronic kidney disease: Secondary | ICD-10-CM | POA: Diagnosis not present

## 2015-06-14 DIAGNOSIS — N186 End stage renal disease: Secondary | ICD-10-CM | POA: Diagnosis not present

## 2015-06-14 DIAGNOSIS — D509 Iron deficiency anemia, unspecified: Secondary | ICD-10-CM | POA: Diagnosis not present

## 2015-06-14 DIAGNOSIS — E162 Hypoglycemia, unspecified: Secondary | ICD-10-CM | POA: Diagnosis not present

## 2015-06-14 DIAGNOSIS — Z992 Dependence on renal dialysis: Secondary | ICD-10-CM | POA: Diagnosis not present

## 2015-06-14 DIAGNOSIS — N2581 Secondary hyperparathyroidism of renal origin: Secondary | ICD-10-CM | POA: Diagnosis not present

## 2015-06-14 DIAGNOSIS — D631 Anemia in chronic kidney disease: Secondary | ICD-10-CM | POA: Diagnosis not present

## 2015-06-17 DIAGNOSIS — N186 End stage renal disease: Secondary | ICD-10-CM | POA: Diagnosis not present

## 2015-06-17 DIAGNOSIS — N2581 Secondary hyperparathyroidism of renal origin: Secondary | ICD-10-CM | POA: Diagnosis not present

## 2015-06-17 DIAGNOSIS — E162 Hypoglycemia, unspecified: Secondary | ICD-10-CM | POA: Diagnosis not present

## 2015-06-17 DIAGNOSIS — D509 Iron deficiency anemia, unspecified: Secondary | ICD-10-CM | POA: Diagnosis not present

## 2015-06-17 DIAGNOSIS — Z992 Dependence on renal dialysis: Secondary | ICD-10-CM | POA: Diagnosis not present

## 2015-06-17 DIAGNOSIS — D631 Anemia in chronic kidney disease: Secondary | ICD-10-CM | POA: Diagnosis not present

## 2015-06-19 DIAGNOSIS — N2581 Secondary hyperparathyroidism of renal origin: Secondary | ICD-10-CM | POA: Diagnosis not present

## 2015-06-19 DIAGNOSIS — D509 Iron deficiency anemia, unspecified: Secondary | ICD-10-CM | POA: Diagnosis not present

## 2015-06-19 DIAGNOSIS — N186 End stage renal disease: Secondary | ICD-10-CM | POA: Diagnosis not present

## 2015-06-19 DIAGNOSIS — D631 Anemia in chronic kidney disease: Secondary | ICD-10-CM | POA: Diagnosis not present

## 2015-06-19 DIAGNOSIS — Z992 Dependence on renal dialysis: Secondary | ICD-10-CM | POA: Diagnosis not present

## 2015-06-19 DIAGNOSIS — E162 Hypoglycemia, unspecified: Secondary | ICD-10-CM | POA: Diagnosis not present

## 2015-06-23 DIAGNOSIS — Z992 Dependence on renal dialysis: Secondary | ICD-10-CM | POA: Diagnosis not present

## 2015-06-23 DIAGNOSIS — E162 Hypoglycemia, unspecified: Secondary | ICD-10-CM | POA: Diagnosis not present

## 2015-06-23 DIAGNOSIS — D509 Iron deficiency anemia, unspecified: Secondary | ICD-10-CM | POA: Diagnosis not present

## 2015-06-23 DIAGNOSIS — N2581 Secondary hyperparathyroidism of renal origin: Secondary | ICD-10-CM | POA: Diagnosis not present

## 2015-06-23 DIAGNOSIS — D631 Anemia in chronic kidney disease: Secondary | ICD-10-CM | POA: Diagnosis not present

## 2015-06-23 DIAGNOSIS — N186 End stage renal disease: Secondary | ICD-10-CM | POA: Diagnosis not present

## 2015-06-24 DIAGNOSIS — E162 Hypoglycemia, unspecified: Secondary | ICD-10-CM | POA: Diagnosis not present

## 2015-06-24 DIAGNOSIS — Z992 Dependence on renal dialysis: Secondary | ICD-10-CM | POA: Diagnosis not present

## 2015-06-24 DIAGNOSIS — N2581 Secondary hyperparathyroidism of renal origin: Secondary | ICD-10-CM | POA: Diagnosis not present

## 2015-06-24 DIAGNOSIS — D509 Iron deficiency anemia, unspecified: Secondary | ICD-10-CM | POA: Diagnosis not present

## 2015-06-24 DIAGNOSIS — D631 Anemia in chronic kidney disease: Secondary | ICD-10-CM | POA: Diagnosis not present

## 2015-06-24 DIAGNOSIS — N186 End stage renal disease: Secondary | ICD-10-CM | POA: Diagnosis not present

## 2015-06-26 DIAGNOSIS — N186 End stage renal disease: Secondary | ICD-10-CM | POA: Diagnosis not present

## 2015-06-26 DIAGNOSIS — N2581 Secondary hyperparathyroidism of renal origin: Secondary | ICD-10-CM | POA: Diagnosis not present

## 2015-06-26 DIAGNOSIS — D631 Anemia in chronic kidney disease: Secondary | ICD-10-CM | POA: Diagnosis not present

## 2015-06-26 DIAGNOSIS — Z992 Dependence on renal dialysis: Secondary | ICD-10-CM | POA: Diagnosis not present

## 2015-06-26 DIAGNOSIS — E162 Hypoglycemia, unspecified: Secondary | ICD-10-CM | POA: Diagnosis not present

## 2015-06-26 DIAGNOSIS — D509 Iron deficiency anemia, unspecified: Secondary | ICD-10-CM | POA: Diagnosis not present

## 2015-06-29 DIAGNOSIS — D631 Anemia in chronic kidney disease: Secondary | ICD-10-CM | POA: Diagnosis not present

## 2015-06-29 DIAGNOSIS — D509 Iron deficiency anemia, unspecified: Secondary | ICD-10-CM | POA: Diagnosis not present

## 2015-06-29 DIAGNOSIS — E162 Hypoglycemia, unspecified: Secondary | ICD-10-CM | POA: Diagnosis not present

## 2015-06-29 DIAGNOSIS — N186 End stage renal disease: Secondary | ICD-10-CM | POA: Diagnosis not present

## 2015-06-29 DIAGNOSIS — N2581 Secondary hyperparathyroidism of renal origin: Secondary | ICD-10-CM | POA: Diagnosis not present

## 2015-06-29 DIAGNOSIS — Z992 Dependence on renal dialysis: Secondary | ICD-10-CM | POA: Diagnosis not present

## 2015-07-01 DIAGNOSIS — N186 End stage renal disease: Secondary | ICD-10-CM | POA: Diagnosis not present

## 2015-07-01 DIAGNOSIS — D631 Anemia in chronic kidney disease: Secondary | ICD-10-CM | POA: Diagnosis not present

## 2015-07-01 DIAGNOSIS — N2581 Secondary hyperparathyroidism of renal origin: Secondary | ICD-10-CM | POA: Diagnosis not present

## 2015-07-01 DIAGNOSIS — Z992 Dependence on renal dialysis: Secondary | ICD-10-CM | POA: Diagnosis not present

## 2015-07-01 DIAGNOSIS — D509 Iron deficiency anemia, unspecified: Secondary | ICD-10-CM | POA: Diagnosis not present

## 2015-07-01 DIAGNOSIS — E162 Hypoglycemia, unspecified: Secondary | ICD-10-CM | POA: Diagnosis not present

## 2015-07-03 DIAGNOSIS — N186 End stage renal disease: Secondary | ICD-10-CM | POA: Diagnosis not present

## 2015-07-03 DIAGNOSIS — N2581 Secondary hyperparathyroidism of renal origin: Secondary | ICD-10-CM | POA: Diagnosis not present

## 2015-07-03 DIAGNOSIS — E162 Hypoglycemia, unspecified: Secondary | ICD-10-CM | POA: Diagnosis not present

## 2015-07-03 DIAGNOSIS — D631 Anemia in chronic kidney disease: Secondary | ICD-10-CM | POA: Diagnosis not present

## 2015-07-03 DIAGNOSIS — D509 Iron deficiency anemia, unspecified: Secondary | ICD-10-CM | POA: Diagnosis not present

## 2015-07-03 DIAGNOSIS — Z992 Dependence on renal dialysis: Secondary | ICD-10-CM | POA: Diagnosis not present

## 2015-07-06 DIAGNOSIS — N2581 Secondary hyperparathyroidism of renal origin: Secondary | ICD-10-CM | POA: Diagnosis not present

## 2015-07-06 DIAGNOSIS — E162 Hypoglycemia, unspecified: Secondary | ICD-10-CM | POA: Diagnosis not present

## 2015-07-06 DIAGNOSIS — D631 Anemia in chronic kidney disease: Secondary | ICD-10-CM | POA: Diagnosis not present

## 2015-07-06 DIAGNOSIS — Z992 Dependence on renal dialysis: Secondary | ICD-10-CM | POA: Diagnosis not present

## 2015-07-06 DIAGNOSIS — N186 End stage renal disease: Secondary | ICD-10-CM | POA: Diagnosis not present

## 2015-07-06 DIAGNOSIS — D509 Iron deficiency anemia, unspecified: Secondary | ICD-10-CM | POA: Diagnosis not present

## 2015-07-08 DIAGNOSIS — Z794 Long term (current) use of insulin: Secondary | ICD-10-CM | POA: Diagnosis not present

## 2015-07-08 DIAGNOSIS — Z992 Dependence on renal dialysis: Secondary | ICD-10-CM | POA: Diagnosis not present

## 2015-07-08 DIAGNOSIS — D509 Iron deficiency anemia, unspecified: Secondary | ICD-10-CM | POA: Diagnosis not present

## 2015-07-08 DIAGNOSIS — D631 Anemia in chronic kidney disease: Secondary | ICD-10-CM | POA: Diagnosis not present

## 2015-07-08 DIAGNOSIS — N2581 Secondary hyperparathyroidism of renal origin: Secondary | ICD-10-CM | POA: Diagnosis not present

## 2015-07-08 DIAGNOSIS — N186 End stage renal disease: Secondary | ICD-10-CM | POA: Diagnosis not present

## 2015-07-08 DIAGNOSIS — E162 Hypoglycemia, unspecified: Secondary | ICD-10-CM | POA: Diagnosis not present

## 2015-07-08 DIAGNOSIS — E11649 Type 2 diabetes mellitus with hypoglycemia without coma: Secondary | ICD-10-CM | POA: Diagnosis not present

## 2015-07-10 DIAGNOSIS — N2581 Secondary hyperparathyroidism of renal origin: Secondary | ICD-10-CM | POA: Diagnosis not present

## 2015-07-10 DIAGNOSIS — Z992 Dependence on renal dialysis: Secondary | ICD-10-CM | POA: Diagnosis not present

## 2015-07-10 DIAGNOSIS — D631 Anemia in chronic kidney disease: Secondary | ICD-10-CM | POA: Diagnosis not present

## 2015-07-10 DIAGNOSIS — N186 End stage renal disease: Secondary | ICD-10-CM | POA: Diagnosis not present

## 2015-07-10 DIAGNOSIS — E162 Hypoglycemia, unspecified: Secondary | ICD-10-CM | POA: Diagnosis not present

## 2015-07-10 DIAGNOSIS — D509 Iron deficiency anemia, unspecified: Secondary | ICD-10-CM | POA: Diagnosis not present

## 2015-07-13 DIAGNOSIS — N186 End stage renal disease: Secondary | ICD-10-CM | POA: Diagnosis not present

## 2015-07-13 DIAGNOSIS — D509 Iron deficiency anemia, unspecified: Secondary | ICD-10-CM | POA: Diagnosis not present

## 2015-07-13 DIAGNOSIS — N2581 Secondary hyperparathyroidism of renal origin: Secondary | ICD-10-CM | POA: Diagnosis not present

## 2015-07-13 DIAGNOSIS — Z992 Dependence on renal dialysis: Secondary | ICD-10-CM | POA: Diagnosis not present

## 2015-07-13 DIAGNOSIS — E162 Hypoglycemia, unspecified: Secondary | ICD-10-CM | POA: Diagnosis not present

## 2015-07-13 DIAGNOSIS — D631 Anemia in chronic kidney disease: Secondary | ICD-10-CM | POA: Diagnosis not present

## 2015-07-14 ENCOUNTER — Other Ambulatory Visit: Payer: Self-pay | Admitting: *Deleted

## 2015-07-14 MED ORDER — METOPROLOL TARTRATE 50 MG PO TABS: 50.0000 mg | ORAL_TABLET | Freq: Two times a day (BID) | ORAL | Status: DC

## 2015-07-15 DIAGNOSIS — D509 Iron deficiency anemia, unspecified: Secondary | ICD-10-CM | POA: Diagnosis not present

## 2015-07-15 DIAGNOSIS — N2581 Secondary hyperparathyroidism of renal origin: Secondary | ICD-10-CM | POA: Diagnosis not present

## 2015-07-15 DIAGNOSIS — E162 Hypoglycemia, unspecified: Secondary | ICD-10-CM | POA: Diagnosis not present

## 2015-07-15 DIAGNOSIS — Z992 Dependence on renal dialysis: Secondary | ICD-10-CM | POA: Diagnosis not present

## 2015-07-15 DIAGNOSIS — N186 End stage renal disease: Secondary | ICD-10-CM | POA: Diagnosis not present

## 2015-07-15 DIAGNOSIS — D631 Anemia in chronic kidney disease: Secondary | ICD-10-CM | POA: Diagnosis not present

## 2015-07-17 DIAGNOSIS — E162 Hypoglycemia, unspecified: Secondary | ICD-10-CM | POA: Diagnosis not present

## 2015-07-17 DIAGNOSIS — D509 Iron deficiency anemia, unspecified: Secondary | ICD-10-CM | POA: Diagnosis not present

## 2015-07-17 DIAGNOSIS — Z992 Dependence on renal dialysis: Secondary | ICD-10-CM | POA: Diagnosis not present

## 2015-07-17 DIAGNOSIS — N186 End stage renal disease: Secondary | ICD-10-CM | POA: Diagnosis not present

## 2015-07-17 DIAGNOSIS — D631 Anemia in chronic kidney disease: Secondary | ICD-10-CM | POA: Diagnosis not present

## 2015-07-17 DIAGNOSIS — N2581 Secondary hyperparathyroidism of renal origin: Secondary | ICD-10-CM | POA: Diagnosis not present

## 2015-07-20 DIAGNOSIS — D509 Iron deficiency anemia, unspecified: Secondary | ICD-10-CM | POA: Diagnosis not present

## 2015-07-20 DIAGNOSIS — D631 Anemia in chronic kidney disease: Secondary | ICD-10-CM | POA: Diagnosis not present

## 2015-07-20 DIAGNOSIS — N2581 Secondary hyperparathyroidism of renal origin: Secondary | ICD-10-CM | POA: Diagnosis not present

## 2015-07-20 DIAGNOSIS — N186 End stage renal disease: Secondary | ICD-10-CM | POA: Diagnosis not present

## 2015-07-20 DIAGNOSIS — Z992 Dependence on renal dialysis: Secondary | ICD-10-CM | POA: Diagnosis not present

## 2015-07-20 DIAGNOSIS — E162 Hypoglycemia, unspecified: Secondary | ICD-10-CM | POA: Diagnosis not present

## 2015-07-22 DIAGNOSIS — N2581 Secondary hyperparathyroidism of renal origin: Secondary | ICD-10-CM | POA: Diagnosis not present

## 2015-07-22 DIAGNOSIS — D631 Anemia in chronic kidney disease: Secondary | ICD-10-CM | POA: Diagnosis not present

## 2015-07-22 DIAGNOSIS — N186 End stage renal disease: Secondary | ICD-10-CM | POA: Diagnosis not present

## 2015-07-22 DIAGNOSIS — Z992 Dependence on renal dialysis: Secondary | ICD-10-CM | POA: Diagnosis not present

## 2015-07-22 DIAGNOSIS — E162 Hypoglycemia, unspecified: Secondary | ICD-10-CM | POA: Diagnosis not present

## 2015-07-22 DIAGNOSIS — D509 Iron deficiency anemia, unspecified: Secondary | ICD-10-CM | POA: Diagnosis not present

## 2015-07-24 DIAGNOSIS — N2581 Secondary hyperparathyroidism of renal origin: Secondary | ICD-10-CM | POA: Diagnosis not present

## 2015-07-24 DIAGNOSIS — D631 Anemia in chronic kidney disease: Secondary | ICD-10-CM | POA: Diagnosis not present

## 2015-07-24 DIAGNOSIS — E162 Hypoglycemia, unspecified: Secondary | ICD-10-CM | POA: Diagnosis not present

## 2015-07-24 DIAGNOSIS — N186 End stage renal disease: Secondary | ICD-10-CM | POA: Diagnosis not present

## 2015-07-24 DIAGNOSIS — Z992 Dependence on renal dialysis: Secondary | ICD-10-CM | POA: Diagnosis not present

## 2015-07-24 DIAGNOSIS — D509 Iron deficiency anemia, unspecified: Secondary | ICD-10-CM | POA: Diagnosis not present

## 2015-07-27 DIAGNOSIS — E162 Hypoglycemia, unspecified: Secondary | ICD-10-CM | POA: Diagnosis not present

## 2015-07-27 DIAGNOSIS — D631 Anemia in chronic kidney disease: Secondary | ICD-10-CM | POA: Diagnosis not present

## 2015-07-27 DIAGNOSIS — D509 Iron deficiency anemia, unspecified: Secondary | ICD-10-CM | POA: Diagnosis not present

## 2015-07-27 DIAGNOSIS — N186 End stage renal disease: Secondary | ICD-10-CM | POA: Diagnosis not present

## 2015-07-27 DIAGNOSIS — Z992 Dependence on renal dialysis: Secondary | ICD-10-CM | POA: Diagnosis not present

## 2015-07-27 DIAGNOSIS — N2581 Secondary hyperparathyroidism of renal origin: Secondary | ICD-10-CM | POA: Diagnosis not present

## 2015-07-29 DIAGNOSIS — Z992 Dependence on renal dialysis: Secondary | ICD-10-CM | POA: Diagnosis not present

## 2015-07-29 DIAGNOSIS — N2581 Secondary hyperparathyroidism of renal origin: Secondary | ICD-10-CM | POA: Diagnosis not present

## 2015-07-29 DIAGNOSIS — E162 Hypoglycemia, unspecified: Secondary | ICD-10-CM | POA: Diagnosis not present

## 2015-07-29 DIAGNOSIS — D631 Anemia in chronic kidney disease: Secondary | ICD-10-CM | POA: Diagnosis not present

## 2015-07-29 DIAGNOSIS — D509 Iron deficiency anemia, unspecified: Secondary | ICD-10-CM | POA: Diagnosis not present

## 2015-07-29 DIAGNOSIS — N186 End stage renal disease: Secondary | ICD-10-CM | POA: Diagnosis not present

## 2015-07-31 DIAGNOSIS — N186 End stage renal disease: Secondary | ICD-10-CM | POA: Diagnosis not present

## 2015-07-31 DIAGNOSIS — D631 Anemia in chronic kidney disease: Secondary | ICD-10-CM | POA: Diagnosis not present

## 2015-07-31 DIAGNOSIS — Z992 Dependence on renal dialysis: Secondary | ICD-10-CM | POA: Diagnosis not present

## 2015-07-31 DIAGNOSIS — E162 Hypoglycemia, unspecified: Secondary | ICD-10-CM | POA: Diagnosis not present

## 2015-07-31 DIAGNOSIS — D509 Iron deficiency anemia, unspecified: Secondary | ICD-10-CM | POA: Diagnosis not present

## 2015-07-31 DIAGNOSIS — N2581 Secondary hyperparathyroidism of renal origin: Secondary | ICD-10-CM | POA: Diagnosis not present

## 2015-08-03 DIAGNOSIS — N186 End stage renal disease: Secondary | ICD-10-CM | POA: Diagnosis not present

## 2015-08-03 DIAGNOSIS — Z992 Dependence on renal dialysis: Secondary | ICD-10-CM | POA: Diagnosis not present

## 2015-08-03 DIAGNOSIS — N2581 Secondary hyperparathyroidism of renal origin: Secondary | ICD-10-CM | POA: Diagnosis not present

## 2015-08-03 DIAGNOSIS — D631 Anemia in chronic kidney disease: Secondary | ICD-10-CM | POA: Diagnosis not present

## 2015-08-03 DIAGNOSIS — D509 Iron deficiency anemia, unspecified: Secondary | ICD-10-CM | POA: Diagnosis not present

## 2015-08-03 DIAGNOSIS — E162 Hypoglycemia, unspecified: Secondary | ICD-10-CM | POA: Diagnosis not present

## 2015-08-05 DIAGNOSIS — E162 Hypoglycemia, unspecified: Secondary | ICD-10-CM | POA: Diagnosis not present

## 2015-08-05 DIAGNOSIS — E11649 Type 2 diabetes mellitus with hypoglycemia without coma: Secondary | ICD-10-CM | POA: Diagnosis not present

## 2015-08-05 DIAGNOSIS — N186 End stage renal disease: Secondary | ICD-10-CM | POA: Diagnosis not present

## 2015-08-05 DIAGNOSIS — N2581 Secondary hyperparathyroidism of renal origin: Secondary | ICD-10-CM | POA: Diagnosis not present

## 2015-08-05 DIAGNOSIS — Z794 Long term (current) use of insulin: Secondary | ICD-10-CM | POA: Diagnosis not present

## 2015-08-05 DIAGNOSIS — Z992 Dependence on renal dialysis: Secondary | ICD-10-CM | POA: Diagnosis not present

## 2015-08-05 DIAGNOSIS — D509 Iron deficiency anemia, unspecified: Secondary | ICD-10-CM | POA: Diagnosis not present

## 2015-08-05 DIAGNOSIS — D631 Anemia in chronic kidney disease: Secondary | ICD-10-CM | POA: Diagnosis not present

## 2015-08-07 DIAGNOSIS — Z992 Dependence on renal dialysis: Secondary | ICD-10-CM | POA: Diagnosis not present

## 2015-08-07 DIAGNOSIS — D509 Iron deficiency anemia, unspecified: Secondary | ICD-10-CM | POA: Diagnosis not present

## 2015-08-07 DIAGNOSIS — D631 Anemia in chronic kidney disease: Secondary | ICD-10-CM | POA: Diagnosis not present

## 2015-08-07 DIAGNOSIS — N2581 Secondary hyperparathyroidism of renal origin: Secondary | ICD-10-CM | POA: Diagnosis not present

## 2015-08-07 DIAGNOSIS — E162 Hypoglycemia, unspecified: Secondary | ICD-10-CM | POA: Diagnosis not present

## 2015-08-07 DIAGNOSIS — N186 End stage renal disease: Secondary | ICD-10-CM | POA: Diagnosis not present

## 2015-08-10 DIAGNOSIS — N186 End stage renal disease: Secondary | ICD-10-CM | POA: Diagnosis not present

## 2015-08-10 DIAGNOSIS — D631 Anemia in chronic kidney disease: Secondary | ICD-10-CM | POA: Diagnosis not present

## 2015-08-10 DIAGNOSIS — Z992 Dependence on renal dialysis: Secondary | ICD-10-CM | POA: Diagnosis not present

## 2015-08-10 DIAGNOSIS — N2581 Secondary hyperparathyroidism of renal origin: Secondary | ICD-10-CM | POA: Diagnosis not present

## 2015-08-10 DIAGNOSIS — E162 Hypoglycemia, unspecified: Secondary | ICD-10-CM | POA: Diagnosis not present

## 2015-08-10 DIAGNOSIS — D509 Iron deficiency anemia, unspecified: Secondary | ICD-10-CM | POA: Diagnosis not present

## 2015-08-12 DIAGNOSIS — D509 Iron deficiency anemia, unspecified: Secondary | ICD-10-CM | POA: Diagnosis not present

## 2015-08-12 DIAGNOSIS — Z992 Dependence on renal dialysis: Secondary | ICD-10-CM | POA: Diagnosis not present

## 2015-08-12 DIAGNOSIS — D631 Anemia in chronic kidney disease: Secondary | ICD-10-CM | POA: Diagnosis not present

## 2015-08-12 DIAGNOSIS — E162 Hypoglycemia, unspecified: Secondary | ICD-10-CM | POA: Diagnosis not present

## 2015-08-12 DIAGNOSIS — N2581 Secondary hyperparathyroidism of renal origin: Secondary | ICD-10-CM | POA: Diagnosis not present

## 2015-08-12 DIAGNOSIS — N186 End stage renal disease: Secondary | ICD-10-CM | POA: Diagnosis not present

## 2015-08-14 DIAGNOSIS — N2581 Secondary hyperparathyroidism of renal origin: Secondary | ICD-10-CM | POA: Diagnosis not present

## 2015-08-14 DIAGNOSIS — D631 Anemia in chronic kidney disease: Secondary | ICD-10-CM | POA: Diagnosis not present

## 2015-08-14 DIAGNOSIS — Z992 Dependence on renal dialysis: Secondary | ICD-10-CM | POA: Diagnosis not present

## 2015-08-14 DIAGNOSIS — E162 Hypoglycemia, unspecified: Secondary | ICD-10-CM | POA: Diagnosis not present

## 2015-08-14 DIAGNOSIS — D509 Iron deficiency anemia, unspecified: Secondary | ICD-10-CM | POA: Diagnosis not present

## 2015-08-14 DIAGNOSIS — N186 End stage renal disease: Secondary | ICD-10-CM | POA: Diagnosis not present

## 2015-08-17 DIAGNOSIS — D509 Iron deficiency anemia, unspecified: Secondary | ICD-10-CM | POA: Diagnosis not present

## 2015-08-17 DIAGNOSIS — N186 End stage renal disease: Secondary | ICD-10-CM | POA: Diagnosis not present

## 2015-08-17 DIAGNOSIS — D631 Anemia in chronic kidney disease: Secondary | ICD-10-CM | POA: Diagnosis not present

## 2015-08-17 DIAGNOSIS — N2581 Secondary hyperparathyroidism of renal origin: Secondary | ICD-10-CM | POA: Diagnosis not present

## 2015-08-17 DIAGNOSIS — E162 Hypoglycemia, unspecified: Secondary | ICD-10-CM | POA: Diagnosis not present

## 2015-08-17 DIAGNOSIS — Z992 Dependence on renal dialysis: Secondary | ICD-10-CM | POA: Diagnosis not present

## 2015-08-19 DIAGNOSIS — D631 Anemia in chronic kidney disease: Secondary | ICD-10-CM | POA: Diagnosis not present

## 2015-08-19 DIAGNOSIS — Z992 Dependence on renal dialysis: Secondary | ICD-10-CM | POA: Diagnosis not present

## 2015-08-19 DIAGNOSIS — E162 Hypoglycemia, unspecified: Secondary | ICD-10-CM | POA: Diagnosis not present

## 2015-08-19 DIAGNOSIS — N186 End stage renal disease: Secondary | ICD-10-CM | POA: Diagnosis not present

## 2015-08-19 DIAGNOSIS — N2581 Secondary hyperparathyroidism of renal origin: Secondary | ICD-10-CM | POA: Diagnosis not present

## 2015-08-19 DIAGNOSIS — D509 Iron deficiency anemia, unspecified: Secondary | ICD-10-CM | POA: Diagnosis not present

## 2015-08-21 DIAGNOSIS — N2581 Secondary hyperparathyroidism of renal origin: Secondary | ICD-10-CM | POA: Diagnosis not present

## 2015-08-21 DIAGNOSIS — Z992 Dependence on renal dialysis: Secondary | ICD-10-CM | POA: Diagnosis not present

## 2015-08-21 DIAGNOSIS — N186 End stage renal disease: Secondary | ICD-10-CM | POA: Diagnosis not present

## 2015-08-21 DIAGNOSIS — E162 Hypoglycemia, unspecified: Secondary | ICD-10-CM | POA: Diagnosis not present

## 2015-08-21 DIAGNOSIS — D631 Anemia in chronic kidney disease: Secondary | ICD-10-CM | POA: Diagnosis not present

## 2015-08-21 DIAGNOSIS — D509 Iron deficiency anemia, unspecified: Secondary | ICD-10-CM | POA: Diagnosis not present

## 2015-08-24 DIAGNOSIS — E162 Hypoglycemia, unspecified: Secondary | ICD-10-CM | POA: Diagnosis not present

## 2015-08-24 DIAGNOSIS — Z992 Dependence on renal dialysis: Secondary | ICD-10-CM | POA: Diagnosis not present

## 2015-08-24 DIAGNOSIS — D509 Iron deficiency anemia, unspecified: Secondary | ICD-10-CM | POA: Diagnosis not present

## 2015-08-24 DIAGNOSIS — N2581 Secondary hyperparathyroidism of renal origin: Secondary | ICD-10-CM | POA: Diagnosis not present

## 2015-08-24 DIAGNOSIS — D631 Anemia in chronic kidney disease: Secondary | ICD-10-CM | POA: Diagnosis not present

## 2015-08-24 DIAGNOSIS — N186 End stage renal disease: Secondary | ICD-10-CM | POA: Diagnosis not present

## 2015-08-26 DIAGNOSIS — E162 Hypoglycemia, unspecified: Secondary | ICD-10-CM | POA: Diagnosis not present

## 2015-08-26 DIAGNOSIS — D509 Iron deficiency anemia, unspecified: Secondary | ICD-10-CM | POA: Diagnosis not present

## 2015-08-26 DIAGNOSIS — N2581 Secondary hyperparathyroidism of renal origin: Secondary | ICD-10-CM | POA: Diagnosis not present

## 2015-08-26 DIAGNOSIS — N186 End stage renal disease: Secondary | ICD-10-CM | POA: Diagnosis not present

## 2015-08-26 DIAGNOSIS — Z992 Dependence on renal dialysis: Secondary | ICD-10-CM | POA: Diagnosis not present

## 2015-08-26 DIAGNOSIS — D631 Anemia in chronic kidney disease: Secondary | ICD-10-CM | POA: Diagnosis not present

## 2015-08-28 DIAGNOSIS — E162 Hypoglycemia, unspecified: Secondary | ICD-10-CM | POA: Diagnosis not present

## 2015-08-28 DIAGNOSIS — D509 Iron deficiency anemia, unspecified: Secondary | ICD-10-CM | POA: Diagnosis not present

## 2015-08-28 DIAGNOSIS — N186 End stage renal disease: Secondary | ICD-10-CM | POA: Diagnosis not present

## 2015-08-28 DIAGNOSIS — Z992 Dependence on renal dialysis: Secondary | ICD-10-CM | POA: Diagnosis not present

## 2015-08-28 DIAGNOSIS — N2581 Secondary hyperparathyroidism of renal origin: Secondary | ICD-10-CM | POA: Diagnosis not present

## 2015-08-28 DIAGNOSIS — D631 Anemia in chronic kidney disease: Secondary | ICD-10-CM | POA: Diagnosis not present

## 2015-08-31 DIAGNOSIS — E119 Type 2 diabetes mellitus without complications: Secondary | ICD-10-CM | POA: Diagnosis not present

## 2015-08-31 DIAGNOSIS — N2581 Secondary hyperparathyroidism of renal origin: Secondary | ICD-10-CM | POA: Diagnosis not present

## 2015-08-31 DIAGNOSIS — Z794 Long term (current) use of insulin: Secondary | ICD-10-CM | POA: Diagnosis not present

## 2015-08-31 DIAGNOSIS — Z992 Dependence on renal dialysis: Secondary | ICD-10-CM | POA: Diagnosis not present

## 2015-08-31 DIAGNOSIS — D631 Anemia in chronic kidney disease: Secondary | ICD-10-CM | POA: Diagnosis not present

## 2015-08-31 DIAGNOSIS — E162 Hypoglycemia, unspecified: Secondary | ICD-10-CM | POA: Diagnosis not present

## 2015-08-31 DIAGNOSIS — N186 End stage renal disease: Secondary | ICD-10-CM | POA: Diagnosis not present

## 2015-08-31 DIAGNOSIS — D509 Iron deficiency anemia, unspecified: Secondary | ICD-10-CM | POA: Diagnosis not present

## 2015-09-02 DIAGNOSIS — Z992 Dependence on renal dialysis: Secondary | ICD-10-CM | POA: Diagnosis not present

## 2015-09-02 DIAGNOSIS — N2581 Secondary hyperparathyroidism of renal origin: Secondary | ICD-10-CM | POA: Diagnosis not present

## 2015-09-02 DIAGNOSIS — D509 Iron deficiency anemia, unspecified: Secondary | ICD-10-CM | POA: Diagnosis not present

## 2015-09-02 DIAGNOSIS — N186 End stage renal disease: Secondary | ICD-10-CM | POA: Diagnosis not present

## 2015-09-02 DIAGNOSIS — E162 Hypoglycemia, unspecified: Secondary | ICD-10-CM | POA: Diagnosis not present

## 2015-09-02 DIAGNOSIS — D631 Anemia in chronic kidney disease: Secondary | ICD-10-CM | POA: Diagnosis not present

## 2015-09-03 DIAGNOSIS — Z992 Dependence on renal dialysis: Secondary | ICD-10-CM | POA: Diagnosis not present

## 2015-09-03 DIAGNOSIS — N186 End stage renal disease: Secondary | ICD-10-CM | POA: Diagnosis not present

## 2015-09-04 DIAGNOSIS — Z992 Dependence on renal dialysis: Secondary | ICD-10-CM | POA: Diagnosis not present

## 2015-09-04 DIAGNOSIS — D631 Anemia in chronic kidney disease: Secondary | ICD-10-CM | POA: Diagnosis not present

## 2015-09-04 DIAGNOSIS — Z794 Long term (current) use of insulin: Secondary | ICD-10-CM | POA: Diagnosis not present

## 2015-09-04 DIAGNOSIS — E11649 Type 2 diabetes mellitus with hypoglycemia without coma: Secondary | ICD-10-CM | POA: Diagnosis not present

## 2015-09-04 DIAGNOSIS — E162 Hypoglycemia, unspecified: Secondary | ICD-10-CM | POA: Diagnosis not present

## 2015-09-04 DIAGNOSIS — Z23 Encounter for immunization: Secondary | ICD-10-CM | POA: Diagnosis not present

## 2015-09-04 DIAGNOSIS — D509 Iron deficiency anemia, unspecified: Secondary | ICD-10-CM | POA: Diagnosis not present

## 2015-09-04 DIAGNOSIS — N186 End stage renal disease: Secondary | ICD-10-CM | POA: Diagnosis not present

## 2015-09-04 DIAGNOSIS — N2581 Secondary hyperparathyroidism of renal origin: Secondary | ICD-10-CM | POA: Diagnosis not present

## 2015-09-06 DIAGNOSIS — Z299 Encounter for prophylactic measures, unspecified: Secondary | ICD-10-CM | POA: Diagnosis not present

## 2015-09-06 DIAGNOSIS — J329 Chronic sinusitis, unspecified: Secondary | ICD-10-CM | POA: Diagnosis not present

## 2015-09-06 DIAGNOSIS — Z789 Other specified health status: Secondary | ICD-10-CM | POA: Diagnosis not present

## 2015-09-06 DIAGNOSIS — I10 Essential (primary) hypertension: Secondary | ICD-10-CM | POA: Diagnosis not present

## 2015-09-07 DIAGNOSIS — D509 Iron deficiency anemia, unspecified: Secondary | ICD-10-CM | POA: Diagnosis not present

## 2015-09-07 DIAGNOSIS — D631 Anemia in chronic kidney disease: Secondary | ICD-10-CM | POA: Diagnosis not present

## 2015-09-07 DIAGNOSIS — Z23 Encounter for immunization: Secondary | ICD-10-CM | POA: Diagnosis not present

## 2015-09-07 DIAGNOSIS — N186 End stage renal disease: Secondary | ICD-10-CM | POA: Diagnosis not present

## 2015-09-07 DIAGNOSIS — N2581 Secondary hyperparathyroidism of renal origin: Secondary | ICD-10-CM | POA: Diagnosis not present

## 2015-09-07 DIAGNOSIS — E162 Hypoglycemia, unspecified: Secondary | ICD-10-CM | POA: Diagnosis not present

## 2015-09-09 DIAGNOSIS — N2581 Secondary hyperparathyroidism of renal origin: Secondary | ICD-10-CM | POA: Diagnosis not present

## 2015-09-09 DIAGNOSIS — D631 Anemia in chronic kidney disease: Secondary | ICD-10-CM | POA: Diagnosis not present

## 2015-09-09 DIAGNOSIS — D509 Iron deficiency anemia, unspecified: Secondary | ICD-10-CM | POA: Diagnosis not present

## 2015-09-09 DIAGNOSIS — E162 Hypoglycemia, unspecified: Secondary | ICD-10-CM | POA: Diagnosis not present

## 2015-09-09 DIAGNOSIS — Z23 Encounter for immunization: Secondary | ICD-10-CM | POA: Diagnosis not present

## 2015-09-09 DIAGNOSIS — N186 End stage renal disease: Secondary | ICD-10-CM | POA: Diagnosis not present

## 2015-09-11 DIAGNOSIS — D509 Iron deficiency anemia, unspecified: Secondary | ICD-10-CM | POA: Diagnosis not present

## 2015-09-11 DIAGNOSIS — N2581 Secondary hyperparathyroidism of renal origin: Secondary | ICD-10-CM | POA: Diagnosis not present

## 2015-09-11 DIAGNOSIS — N186 End stage renal disease: Secondary | ICD-10-CM | POA: Diagnosis not present

## 2015-09-11 DIAGNOSIS — E162 Hypoglycemia, unspecified: Secondary | ICD-10-CM | POA: Diagnosis not present

## 2015-09-11 DIAGNOSIS — D631 Anemia in chronic kidney disease: Secondary | ICD-10-CM | POA: Diagnosis not present

## 2015-09-11 DIAGNOSIS — Z23 Encounter for immunization: Secondary | ICD-10-CM | POA: Diagnosis not present

## 2015-09-14 DIAGNOSIS — D509 Iron deficiency anemia, unspecified: Secondary | ICD-10-CM | POA: Diagnosis not present

## 2015-09-14 DIAGNOSIS — N186 End stage renal disease: Secondary | ICD-10-CM | POA: Diagnosis not present

## 2015-09-14 DIAGNOSIS — N2581 Secondary hyperparathyroidism of renal origin: Secondary | ICD-10-CM | POA: Diagnosis not present

## 2015-09-14 DIAGNOSIS — Z23 Encounter for immunization: Secondary | ICD-10-CM | POA: Diagnosis not present

## 2015-09-14 DIAGNOSIS — E162 Hypoglycemia, unspecified: Secondary | ICD-10-CM | POA: Diagnosis not present

## 2015-09-14 DIAGNOSIS — D631 Anemia in chronic kidney disease: Secondary | ICD-10-CM | POA: Diagnosis not present

## 2015-09-16 DIAGNOSIS — E162 Hypoglycemia, unspecified: Secondary | ICD-10-CM | POA: Diagnosis not present

## 2015-09-16 DIAGNOSIS — N2581 Secondary hyperparathyroidism of renal origin: Secondary | ICD-10-CM | POA: Diagnosis not present

## 2015-09-16 DIAGNOSIS — Z23 Encounter for immunization: Secondary | ICD-10-CM | POA: Diagnosis not present

## 2015-09-16 DIAGNOSIS — D509 Iron deficiency anemia, unspecified: Secondary | ICD-10-CM | POA: Diagnosis not present

## 2015-09-16 DIAGNOSIS — D631 Anemia in chronic kidney disease: Secondary | ICD-10-CM | POA: Diagnosis not present

## 2015-09-16 DIAGNOSIS — N186 End stage renal disease: Secondary | ICD-10-CM | POA: Diagnosis not present

## 2015-09-18 DIAGNOSIS — N2581 Secondary hyperparathyroidism of renal origin: Secondary | ICD-10-CM | POA: Diagnosis not present

## 2015-09-18 DIAGNOSIS — N186 End stage renal disease: Secondary | ICD-10-CM | POA: Diagnosis not present

## 2015-09-18 DIAGNOSIS — E162 Hypoglycemia, unspecified: Secondary | ICD-10-CM | POA: Diagnosis not present

## 2015-09-18 DIAGNOSIS — D509 Iron deficiency anemia, unspecified: Secondary | ICD-10-CM | POA: Diagnosis not present

## 2015-09-18 DIAGNOSIS — D631 Anemia in chronic kidney disease: Secondary | ICD-10-CM | POA: Diagnosis not present

## 2015-09-18 DIAGNOSIS — Z23 Encounter for immunization: Secondary | ICD-10-CM | POA: Diagnosis not present

## 2015-09-21 DIAGNOSIS — D509 Iron deficiency anemia, unspecified: Secondary | ICD-10-CM | POA: Diagnosis not present

## 2015-09-21 DIAGNOSIS — D631 Anemia in chronic kidney disease: Secondary | ICD-10-CM | POA: Diagnosis not present

## 2015-09-21 DIAGNOSIS — E162 Hypoglycemia, unspecified: Secondary | ICD-10-CM | POA: Diagnosis not present

## 2015-09-21 DIAGNOSIS — N186 End stage renal disease: Secondary | ICD-10-CM | POA: Diagnosis not present

## 2015-09-21 DIAGNOSIS — Z23 Encounter for immunization: Secondary | ICD-10-CM | POA: Diagnosis not present

## 2015-09-21 DIAGNOSIS — N2581 Secondary hyperparathyroidism of renal origin: Secondary | ICD-10-CM | POA: Diagnosis not present

## 2015-09-23 DIAGNOSIS — D631 Anemia in chronic kidney disease: Secondary | ICD-10-CM | POA: Diagnosis not present

## 2015-09-23 DIAGNOSIS — D509 Iron deficiency anemia, unspecified: Secondary | ICD-10-CM | POA: Diagnosis not present

## 2015-09-23 DIAGNOSIS — E162 Hypoglycemia, unspecified: Secondary | ICD-10-CM | POA: Diagnosis not present

## 2015-09-23 DIAGNOSIS — Z23 Encounter for immunization: Secondary | ICD-10-CM | POA: Diagnosis not present

## 2015-09-23 DIAGNOSIS — N2581 Secondary hyperparathyroidism of renal origin: Secondary | ICD-10-CM | POA: Diagnosis not present

## 2015-09-23 DIAGNOSIS — N186 End stage renal disease: Secondary | ICD-10-CM | POA: Diagnosis not present

## 2015-09-25 DIAGNOSIS — D631 Anemia in chronic kidney disease: Secondary | ICD-10-CM | POA: Diagnosis not present

## 2015-09-25 DIAGNOSIS — N186 End stage renal disease: Secondary | ICD-10-CM | POA: Diagnosis not present

## 2015-09-25 DIAGNOSIS — D509 Iron deficiency anemia, unspecified: Secondary | ICD-10-CM | POA: Diagnosis not present

## 2015-09-25 DIAGNOSIS — N2581 Secondary hyperparathyroidism of renal origin: Secondary | ICD-10-CM | POA: Diagnosis not present

## 2015-09-25 DIAGNOSIS — E162 Hypoglycemia, unspecified: Secondary | ICD-10-CM | POA: Diagnosis not present

## 2015-09-25 DIAGNOSIS — Z23 Encounter for immunization: Secondary | ICD-10-CM | POA: Diagnosis not present

## 2015-09-28 DIAGNOSIS — N2581 Secondary hyperparathyroidism of renal origin: Secondary | ICD-10-CM | POA: Diagnosis not present

## 2015-09-28 DIAGNOSIS — E162 Hypoglycemia, unspecified: Secondary | ICD-10-CM | POA: Diagnosis not present

## 2015-09-28 DIAGNOSIS — D631 Anemia in chronic kidney disease: Secondary | ICD-10-CM | POA: Diagnosis not present

## 2015-09-28 DIAGNOSIS — D509 Iron deficiency anemia, unspecified: Secondary | ICD-10-CM | POA: Diagnosis not present

## 2015-09-28 DIAGNOSIS — N186 End stage renal disease: Secondary | ICD-10-CM | POA: Diagnosis not present

## 2015-09-28 DIAGNOSIS — Z23 Encounter for immunization: Secondary | ICD-10-CM | POA: Diagnosis not present

## 2015-09-30 DIAGNOSIS — D509 Iron deficiency anemia, unspecified: Secondary | ICD-10-CM | POA: Diagnosis not present

## 2015-09-30 DIAGNOSIS — Z23 Encounter for immunization: Secondary | ICD-10-CM | POA: Diagnosis not present

## 2015-09-30 DIAGNOSIS — N186 End stage renal disease: Secondary | ICD-10-CM | POA: Diagnosis not present

## 2015-09-30 DIAGNOSIS — N2581 Secondary hyperparathyroidism of renal origin: Secondary | ICD-10-CM | POA: Diagnosis not present

## 2015-09-30 DIAGNOSIS — E162 Hypoglycemia, unspecified: Secondary | ICD-10-CM | POA: Diagnosis not present

## 2015-09-30 DIAGNOSIS — D631 Anemia in chronic kidney disease: Secondary | ICD-10-CM | POA: Diagnosis not present

## 2015-10-02 DIAGNOSIS — E162 Hypoglycemia, unspecified: Secondary | ICD-10-CM | POA: Diagnosis not present

## 2015-10-02 DIAGNOSIS — D631 Anemia in chronic kidney disease: Secondary | ICD-10-CM | POA: Diagnosis not present

## 2015-10-02 DIAGNOSIS — Z23 Encounter for immunization: Secondary | ICD-10-CM | POA: Diagnosis not present

## 2015-10-02 DIAGNOSIS — D509 Iron deficiency anemia, unspecified: Secondary | ICD-10-CM | POA: Diagnosis not present

## 2015-10-02 DIAGNOSIS — N2581 Secondary hyperparathyroidism of renal origin: Secondary | ICD-10-CM | POA: Diagnosis not present

## 2015-10-02 DIAGNOSIS — N186 End stage renal disease: Secondary | ICD-10-CM | POA: Diagnosis not present

## 2015-10-03 DIAGNOSIS — N186 End stage renal disease: Secondary | ICD-10-CM | POA: Diagnosis not present

## 2015-10-03 DIAGNOSIS — Z992 Dependence on renal dialysis: Secondary | ICD-10-CM | POA: Diagnosis not present

## 2015-10-03 IMAGING — CT CT HEAD W/O CM
1 series · 16 of 30 positions shown, 20 images · non-contrast
Comparison: None.

CLINICAL DATA: Status post fall

EXAM:
CT HEAD WITHOUT CONTRAST
TECHNIQUE: Contiguous axial images were obtained from the base of the skull
through the vertex without intravenous contrast.

[Series 603: axial brain · axial · 0.43mm/px · z∈[+45,+197]mm · 16 of 36 slices shown, 20 images]
[im 2/36  brain]
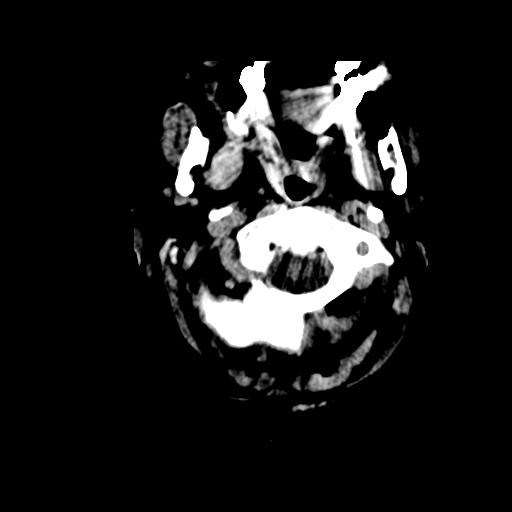
[im 2/36  bone]
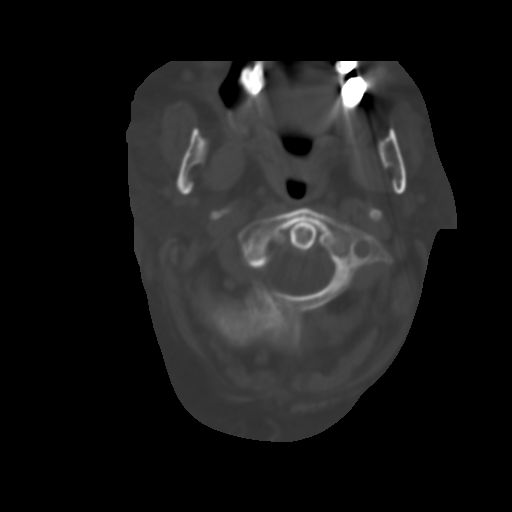
[im 4/36  brain]
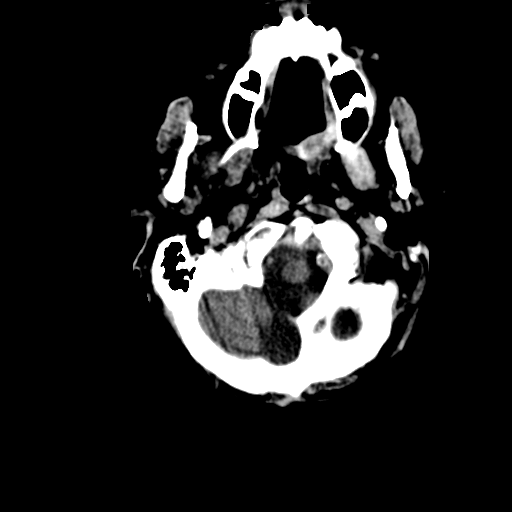
[im 7/36  brain]
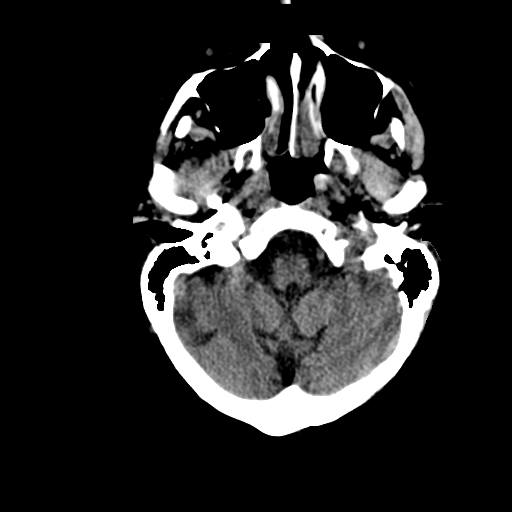
[im 9/36  brain]
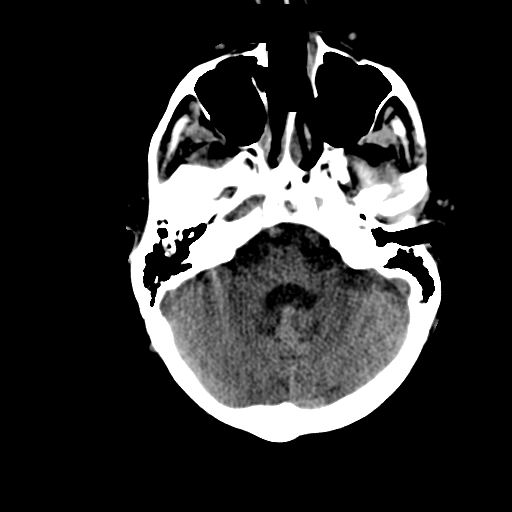
[im 10/36  brain]
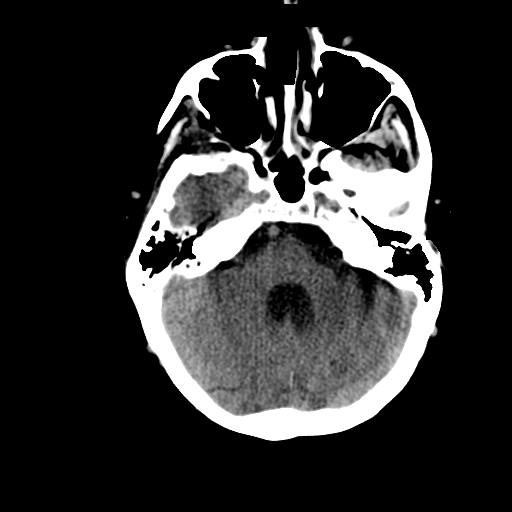
[im 10/36  bone]
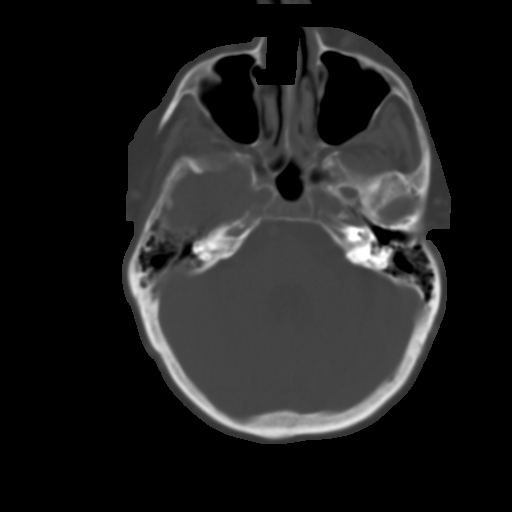
[im 13/36  brain]
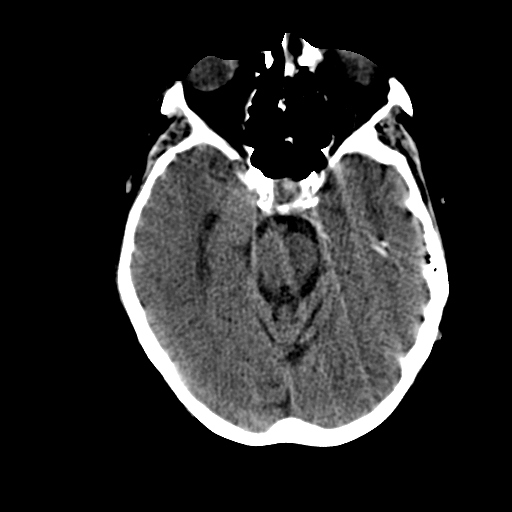
[im 15/36  brain]
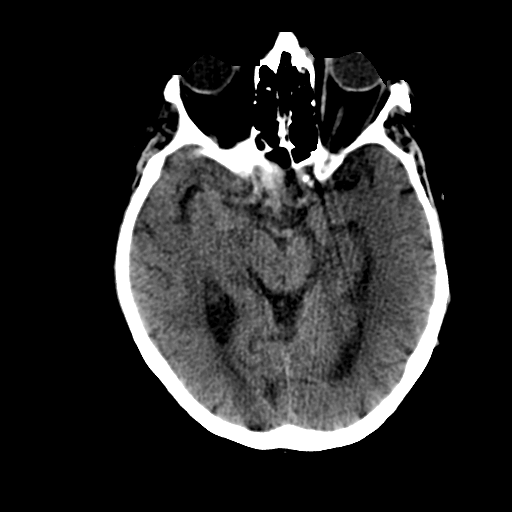
[im 17/36  brain]
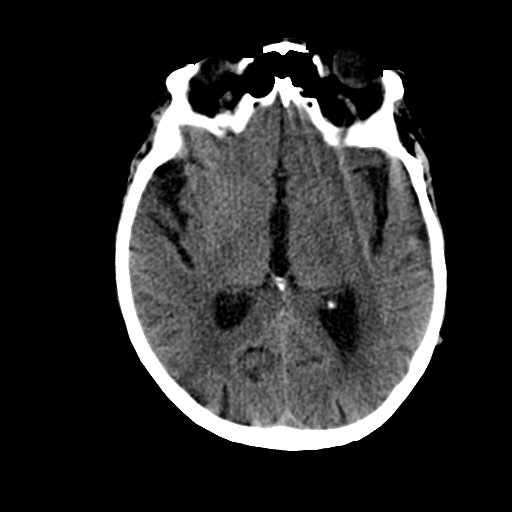
[im 19/36  brain]
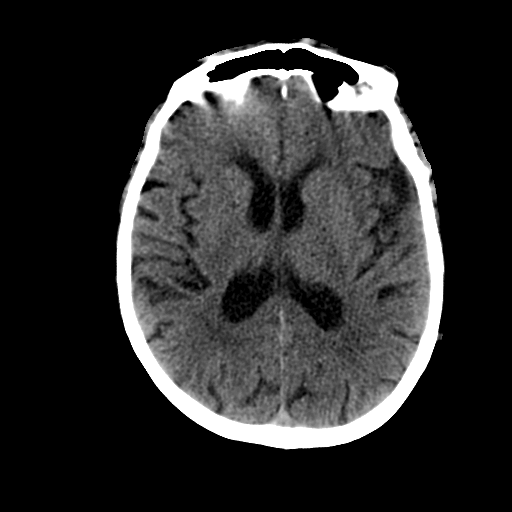
[im 19/36  bone]
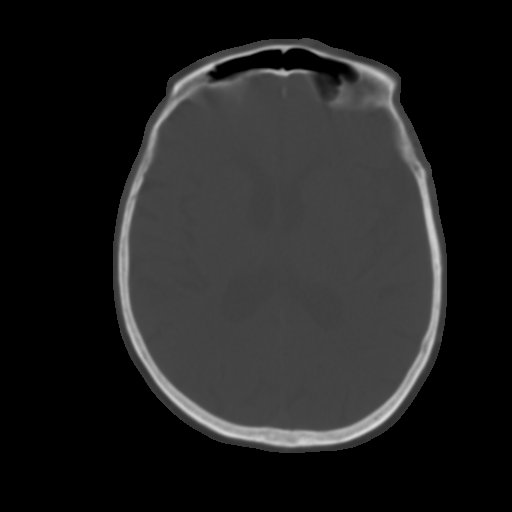
[im 21/36  brain]
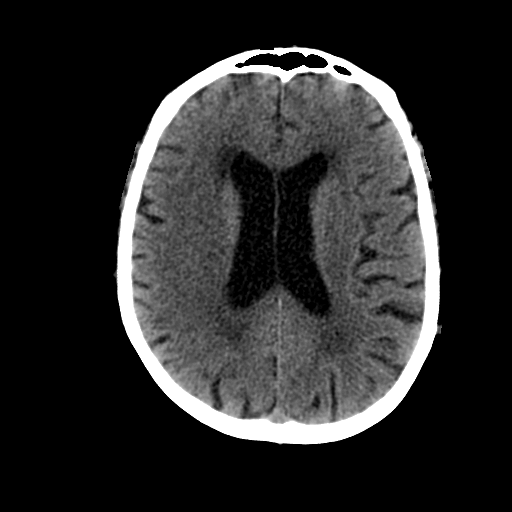
[im 23/36  brain]
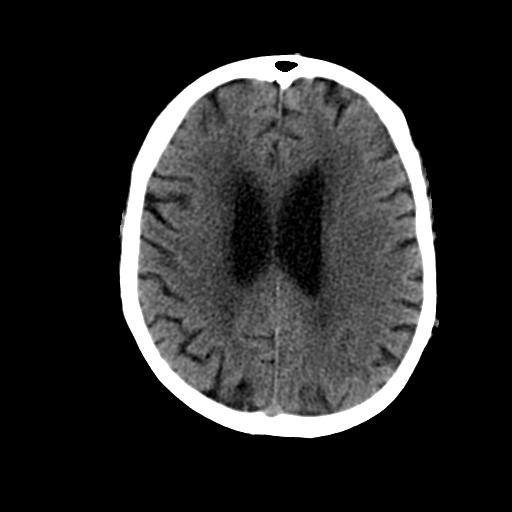
[im 26/36  brain]
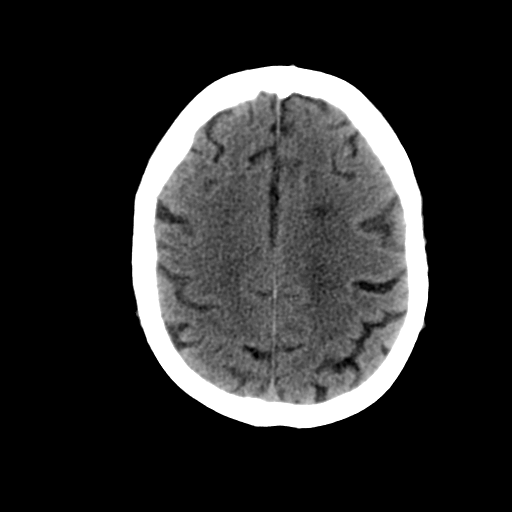
[im 27/36  brain]
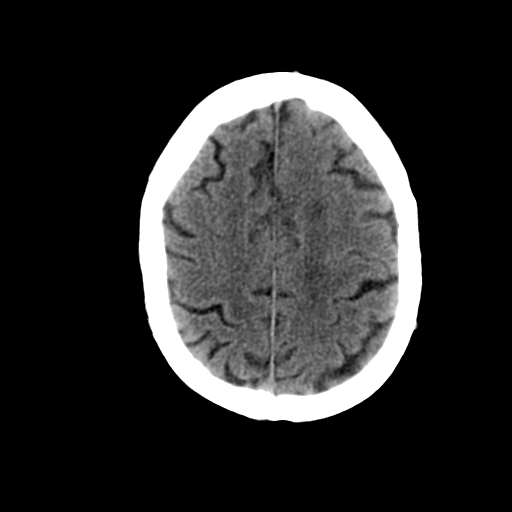
[im 27/36  bone]
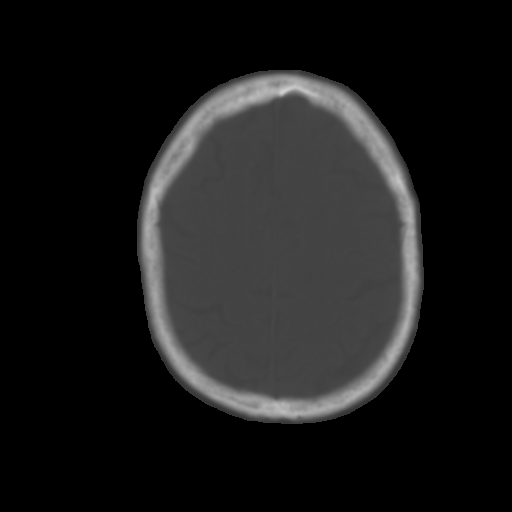
[im 29/36  brain]
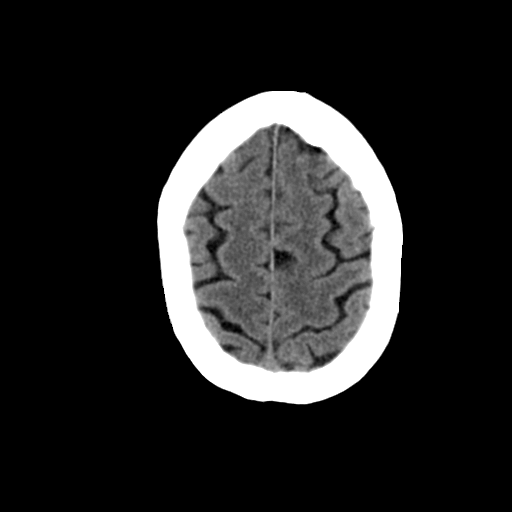
[im 32/36  brain]
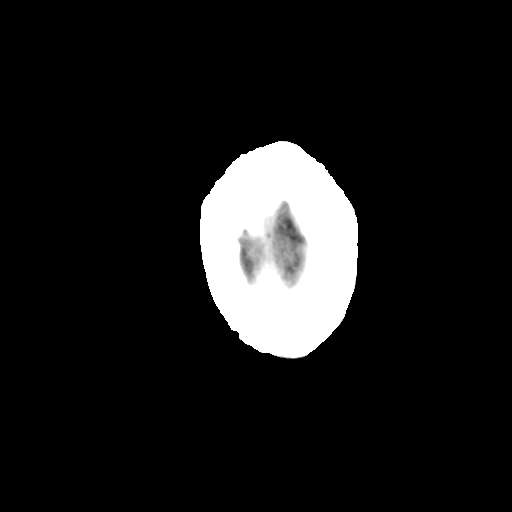
[im 34/36  brain]
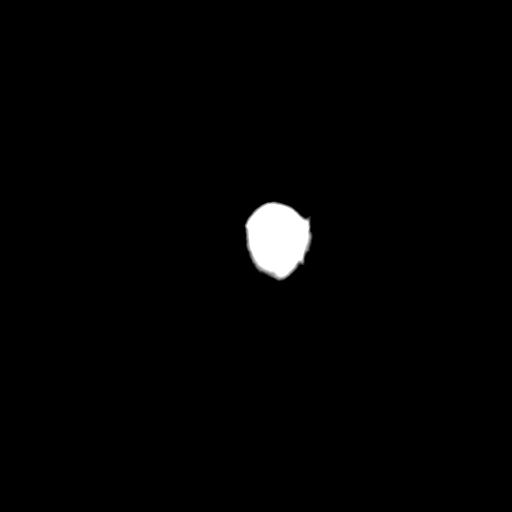

[16 of 30 positions shown; findings below may reference images not displayed]

FINDINGS: There is patchy low attenuation within the subcortical and
periventricular white matter compatible with microvascular disease.
Prominence of the sulci and ventricles identified consistent with
brain atrophy. No evidence for acute brain infarct, hemorrhage or
mass. No extra-axial fluid collections. The paranasal sinuses are
clear. The mastoid air cells are also clear. The calvarium is
intact.
IMPRESSION: 1. No acute intracranial abnormalities.
2. Small vessel ischemic change and brain atrophy.

## 2015-10-05 DIAGNOSIS — N186 End stage renal disease: Secondary | ICD-10-CM | POA: Diagnosis not present

## 2015-10-05 DIAGNOSIS — E162 Hypoglycemia, unspecified: Secondary | ICD-10-CM | POA: Diagnosis not present

## 2015-10-05 DIAGNOSIS — D509 Iron deficiency anemia, unspecified: Secondary | ICD-10-CM | POA: Diagnosis not present

## 2015-10-05 DIAGNOSIS — Z794 Long term (current) use of insulin: Secondary | ICD-10-CM | POA: Diagnosis not present

## 2015-10-05 DIAGNOSIS — D631 Anemia in chronic kidney disease: Secondary | ICD-10-CM | POA: Diagnosis not present

## 2015-10-05 DIAGNOSIS — E11649 Type 2 diabetes mellitus with hypoglycemia without coma: Secondary | ICD-10-CM | POA: Diagnosis not present

## 2015-10-05 DIAGNOSIS — N2581 Secondary hyperparathyroidism of renal origin: Secondary | ICD-10-CM | POA: Diagnosis not present

## 2015-10-05 DIAGNOSIS — Z992 Dependence on renal dialysis: Secondary | ICD-10-CM | POA: Diagnosis not present

## 2015-10-09 DIAGNOSIS — N186 End stage renal disease: Secondary | ICD-10-CM | POA: Diagnosis not present

## 2015-10-09 DIAGNOSIS — N2581 Secondary hyperparathyroidism of renal origin: Secondary | ICD-10-CM | POA: Diagnosis not present

## 2015-10-09 DIAGNOSIS — Z992 Dependence on renal dialysis: Secondary | ICD-10-CM | POA: Diagnosis not present

## 2015-10-09 DIAGNOSIS — D509 Iron deficiency anemia, unspecified: Secondary | ICD-10-CM | POA: Diagnosis not present

## 2015-10-09 DIAGNOSIS — E162 Hypoglycemia, unspecified: Secondary | ICD-10-CM | POA: Diagnosis not present

## 2015-10-09 DIAGNOSIS — D631 Anemia in chronic kidney disease: Secondary | ICD-10-CM | POA: Diagnosis not present

## 2015-10-12 DIAGNOSIS — N186 End stage renal disease: Secondary | ICD-10-CM | POA: Diagnosis not present

## 2015-10-12 DIAGNOSIS — N2581 Secondary hyperparathyroidism of renal origin: Secondary | ICD-10-CM | POA: Diagnosis not present

## 2015-10-12 DIAGNOSIS — Z992 Dependence on renal dialysis: Secondary | ICD-10-CM | POA: Diagnosis not present

## 2015-10-12 DIAGNOSIS — D509 Iron deficiency anemia, unspecified: Secondary | ICD-10-CM | POA: Diagnosis not present

## 2015-10-12 DIAGNOSIS — E162 Hypoglycemia, unspecified: Secondary | ICD-10-CM | POA: Diagnosis not present

## 2015-10-12 DIAGNOSIS — D631 Anemia in chronic kidney disease: Secondary | ICD-10-CM | POA: Diagnosis not present

## 2015-10-14 DIAGNOSIS — Z992 Dependence on renal dialysis: Secondary | ICD-10-CM | POA: Diagnosis not present

## 2015-10-14 DIAGNOSIS — E162 Hypoglycemia, unspecified: Secondary | ICD-10-CM | POA: Diagnosis not present

## 2015-10-14 DIAGNOSIS — D509 Iron deficiency anemia, unspecified: Secondary | ICD-10-CM | POA: Diagnosis not present

## 2015-10-14 DIAGNOSIS — D631 Anemia in chronic kidney disease: Secondary | ICD-10-CM | POA: Diagnosis not present

## 2015-10-14 DIAGNOSIS — N186 End stage renal disease: Secondary | ICD-10-CM | POA: Diagnosis not present

## 2015-10-14 DIAGNOSIS — N2581 Secondary hyperparathyroidism of renal origin: Secondary | ICD-10-CM | POA: Diagnosis not present

## 2015-10-16 DIAGNOSIS — E162 Hypoglycemia, unspecified: Secondary | ICD-10-CM | POA: Diagnosis not present

## 2015-10-16 DIAGNOSIS — N2581 Secondary hyperparathyroidism of renal origin: Secondary | ICD-10-CM | POA: Diagnosis not present

## 2015-10-16 DIAGNOSIS — D631 Anemia in chronic kidney disease: Secondary | ICD-10-CM | POA: Diagnosis not present

## 2015-10-16 DIAGNOSIS — D509 Iron deficiency anemia, unspecified: Secondary | ICD-10-CM | POA: Diagnosis not present

## 2015-10-16 DIAGNOSIS — N186 End stage renal disease: Secondary | ICD-10-CM | POA: Diagnosis not present

## 2015-10-16 DIAGNOSIS — Z992 Dependence on renal dialysis: Secondary | ICD-10-CM | POA: Diagnosis not present

## 2015-10-19 DIAGNOSIS — Z992 Dependence on renal dialysis: Secondary | ICD-10-CM | POA: Diagnosis not present

## 2015-10-19 DIAGNOSIS — D631 Anemia in chronic kidney disease: Secondary | ICD-10-CM | POA: Diagnosis not present

## 2015-10-19 DIAGNOSIS — E162 Hypoglycemia, unspecified: Secondary | ICD-10-CM | POA: Diagnosis not present

## 2015-10-19 DIAGNOSIS — N2581 Secondary hyperparathyroidism of renal origin: Secondary | ICD-10-CM | POA: Diagnosis not present

## 2015-10-19 DIAGNOSIS — D509 Iron deficiency anemia, unspecified: Secondary | ICD-10-CM | POA: Diagnosis not present

## 2015-10-19 DIAGNOSIS — N186 End stage renal disease: Secondary | ICD-10-CM | POA: Diagnosis not present

## 2015-10-21 DIAGNOSIS — N2581 Secondary hyperparathyroidism of renal origin: Secondary | ICD-10-CM | POA: Diagnosis not present

## 2015-10-21 DIAGNOSIS — E162 Hypoglycemia, unspecified: Secondary | ICD-10-CM | POA: Diagnosis not present

## 2015-10-21 DIAGNOSIS — Z992 Dependence on renal dialysis: Secondary | ICD-10-CM | POA: Diagnosis not present

## 2015-10-21 DIAGNOSIS — N186 End stage renal disease: Secondary | ICD-10-CM | POA: Diagnosis not present

## 2015-10-21 DIAGNOSIS — D509 Iron deficiency anemia, unspecified: Secondary | ICD-10-CM | POA: Diagnosis not present

## 2015-10-21 DIAGNOSIS — D631 Anemia in chronic kidney disease: Secondary | ICD-10-CM | POA: Diagnosis not present

## 2015-10-23 DIAGNOSIS — N2581 Secondary hyperparathyroidism of renal origin: Secondary | ICD-10-CM | POA: Diagnosis not present

## 2015-10-23 DIAGNOSIS — D631 Anemia in chronic kidney disease: Secondary | ICD-10-CM | POA: Diagnosis not present

## 2015-10-23 DIAGNOSIS — N186 End stage renal disease: Secondary | ICD-10-CM | POA: Diagnosis not present

## 2015-10-23 DIAGNOSIS — E162 Hypoglycemia, unspecified: Secondary | ICD-10-CM | POA: Diagnosis not present

## 2015-10-23 DIAGNOSIS — D509 Iron deficiency anemia, unspecified: Secondary | ICD-10-CM | POA: Diagnosis not present

## 2015-10-23 DIAGNOSIS — Z992 Dependence on renal dialysis: Secondary | ICD-10-CM | POA: Diagnosis not present

## 2015-10-26 DIAGNOSIS — Z992 Dependence on renal dialysis: Secondary | ICD-10-CM | POA: Diagnosis not present

## 2015-10-26 DIAGNOSIS — D631 Anemia in chronic kidney disease: Secondary | ICD-10-CM | POA: Diagnosis not present

## 2015-10-26 DIAGNOSIS — E162 Hypoglycemia, unspecified: Secondary | ICD-10-CM | POA: Diagnosis not present

## 2015-10-26 DIAGNOSIS — N186 End stage renal disease: Secondary | ICD-10-CM | POA: Diagnosis not present

## 2015-10-26 DIAGNOSIS — N2581 Secondary hyperparathyroidism of renal origin: Secondary | ICD-10-CM | POA: Diagnosis not present

## 2015-10-26 DIAGNOSIS — D509 Iron deficiency anemia, unspecified: Secondary | ICD-10-CM | POA: Diagnosis not present

## 2015-10-28 DIAGNOSIS — N186 End stage renal disease: Secondary | ICD-10-CM | POA: Diagnosis not present

## 2015-10-28 DIAGNOSIS — E162 Hypoglycemia, unspecified: Secondary | ICD-10-CM | POA: Diagnosis not present

## 2015-10-28 DIAGNOSIS — D631 Anemia in chronic kidney disease: Secondary | ICD-10-CM | POA: Diagnosis not present

## 2015-10-28 DIAGNOSIS — D509 Iron deficiency anemia, unspecified: Secondary | ICD-10-CM | POA: Diagnosis not present

## 2015-10-28 DIAGNOSIS — N2581 Secondary hyperparathyroidism of renal origin: Secondary | ICD-10-CM | POA: Diagnosis not present

## 2015-10-28 DIAGNOSIS — Z992 Dependence on renal dialysis: Secondary | ICD-10-CM | POA: Diagnosis not present

## 2015-10-30 DIAGNOSIS — N2581 Secondary hyperparathyroidism of renal origin: Secondary | ICD-10-CM | POA: Diagnosis not present

## 2015-10-30 DIAGNOSIS — Z992 Dependence on renal dialysis: Secondary | ICD-10-CM | POA: Diagnosis not present

## 2015-10-30 DIAGNOSIS — N186 End stage renal disease: Secondary | ICD-10-CM | POA: Diagnosis not present

## 2015-10-30 DIAGNOSIS — E162 Hypoglycemia, unspecified: Secondary | ICD-10-CM | POA: Diagnosis not present

## 2015-10-30 DIAGNOSIS — D509 Iron deficiency anemia, unspecified: Secondary | ICD-10-CM | POA: Diagnosis not present

## 2015-10-30 DIAGNOSIS — D631 Anemia in chronic kidney disease: Secondary | ICD-10-CM | POA: Diagnosis not present

## 2015-11-02 DIAGNOSIS — N186 End stage renal disease: Secondary | ICD-10-CM | POA: Diagnosis not present

## 2015-11-02 DIAGNOSIS — N2581 Secondary hyperparathyroidism of renal origin: Secondary | ICD-10-CM | POA: Diagnosis not present

## 2015-11-02 DIAGNOSIS — D631 Anemia in chronic kidney disease: Secondary | ICD-10-CM | POA: Diagnosis not present

## 2015-11-02 DIAGNOSIS — D509 Iron deficiency anemia, unspecified: Secondary | ICD-10-CM | POA: Diagnosis not present

## 2015-11-02 DIAGNOSIS — E162 Hypoglycemia, unspecified: Secondary | ICD-10-CM | POA: Diagnosis not present

## 2015-11-02 DIAGNOSIS — Z992 Dependence on renal dialysis: Secondary | ICD-10-CM | POA: Diagnosis not present

## 2015-11-03 DIAGNOSIS — N186 End stage renal disease: Secondary | ICD-10-CM | POA: Diagnosis not present

## 2015-11-03 DIAGNOSIS — Z992 Dependence on renal dialysis: Secondary | ICD-10-CM | POA: Diagnosis not present

## 2015-11-04 DIAGNOSIS — N186 End stage renal disease: Secondary | ICD-10-CM | POA: Diagnosis not present

## 2015-11-04 DIAGNOSIS — D631 Anemia in chronic kidney disease: Secondary | ICD-10-CM | POA: Diagnosis not present

## 2015-11-04 DIAGNOSIS — N2581 Secondary hyperparathyroidism of renal origin: Secondary | ICD-10-CM | POA: Diagnosis not present

## 2015-11-04 DIAGNOSIS — D509 Iron deficiency anemia, unspecified: Secondary | ICD-10-CM | POA: Diagnosis not present

## 2015-11-04 DIAGNOSIS — Z992 Dependence on renal dialysis: Secondary | ICD-10-CM | POA: Diagnosis not present

## 2015-11-06 DIAGNOSIS — N186 End stage renal disease: Secondary | ICD-10-CM | POA: Diagnosis not present

## 2015-11-06 DIAGNOSIS — D631 Anemia in chronic kidney disease: Secondary | ICD-10-CM | POA: Diagnosis not present

## 2015-11-06 DIAGNOSIS — Z992 Dependence on renal dialysis: Secondary | ICD-10-CM | POA: Diagnosis not present

## 2015-11-06 DIAGNOSIS — D509 Iron deficiency anemia, unspecified: Secondary | ICD-10-CM | POA: Diagnosis not present

## 2015-11-06 DIAGNOSIS — N2581 Secondary hyperparathyroidism of renal origin: Secondary | ICD-10-CM | POA: Diagnosis not present

## 2015-11-09 DIAGNOSIS — N186 End stage renal disease: Secondary | ICD-10-CM | POA: Diagnosis not present

## 2015-11-09 DIAGNOSIS — D509 Iron deficiency anemia, unspecified: Secondary | ICD-10-CM | POA: Diagnosis not present

## 2015-11-09 DIAGNOSIS — Z992 Dependence on renal dialysis: Secondary | ICD-10-CM | POA: Diagnosis not present

## 2015-11-09 DIAGNOSIS — N2581 Secondary hyperparathyroidism of renal origin: Secondary | ICD-10-CM | POA: Diagnosis not present

## 2015-11-09 DIAGNOSIS — D631 Anemia in chronic kidney disease: Secondary | ICD-10-CM | POA: Diagnosis not present

## 2015-11-11 DIAGNOSIS — D631 Anemia in chronic kidney disease: Secondary | ICD-10-CM | POA: Diagnosis not present

## 2015-11-11 DIAGNOSIS — N186 End stage renal disease: Secondary | ICD-10-CM | POA: Diagnosis not present

## 2015-11-11 DIAGNOSIS — D509 Iron deficiency anemia, unspecified: Secondary | ICD-10-CM | POA: Diagnosis not present

## 2015-11-11 DIAGNOSIS — Z992 Dependence on renal dialysis: Secondary | ICD-10-CM | POA: Diagnosis not present

## 2015-11-11 DIAGNOSIS — N2581 Secondary hyperparathyroidism of renal origin: Secondary | ICD-10-CM | POA: Diagnosis not present

## 2015-11-15 DIAGNOSIS — D509 Iron deficiency anemia, unspecified: Secondary | ICD-10-CM | POA: Diagnosis not present

## 2015-11-15 DIAGNOSIS — D631 Anemia in chronic kidney disease: Secondary | ICD-10-CM | POA: Diagnosis not present

## 2015-11-15 DIAGNOSIS — Z992 Dependence on renal dialysis: Secondary | ICD-10-CM | POA: Diagnosis not present

## 2015-11-15 DIAGNOSIS — N186 End stage renal disease: Secondary | ICD-10-CM | POA: Diagnosis not present

## 2015-11-15 DIAGNOSIS — N2581 Secondary hyperparathyroidism of renal origin: Secondary | ICD-10-CM | POA: Diagnosis not present

## 2015-11-18 DIAGNOSIS — D509 Iron deficiency anemia, unspecified: Secondary | ICD-10-CM | POA: Diagnosis not present

## 2015-11-18 DIAGNOSIS — D631 Anemia in chronic kidney disease: Secondary | ICD-10-CM | POA: Diagnosis not present

## 2015-11-18 DIAGNOSIS — N186 End stage renal disease: Secondary | ICD-10-CM | POA: Diagnosis not present

## 2015-11-18 DIAGNOSIS — Z992 Dependence on renal dialysis: Secondary | ICD-10-CM | POA: Diagnosis not present

## 2015-11-18 DIAGNOSIS — N2581 Secondary hyperparathyroidism of renal origin: Secondary | ICD-10-CM | POA: Diagnosis not present

## 2015-11-20 DIAGNOSIS — D509 Iron deficiency anemia, unspecified: Secondary | ICD-10-CM | POA: Diagnosis not present

## 2015-11-20 DIAGNOSIS — N2581 Secondary hyperparathyroidism of renal origin: Secondary | ICD-10-CM | POA: Diagnosis not present

## 2015-11-20 DIAGNOSIS — N186 End stage renal disease: Secondary | ICD-10-CM | POA: Diagnosis not present

## 2015-11-20 DIAGNOSIS — D631 Anemia in chronic kidney disease: Secondary | ICD-10-CM | POA: Diagnosis not present

## 2015-11-20 DIAGNOSIS — Z992 Dependence on renal dialysis: Secondary | ICD-10-CM | POA: Diagnosis not present

## 2015-11-23 DIAGNOSIS — N2581 Secondary hyperparathyroidism of renal origin: Secondary | ICD-10-CM | POA: Diagnosis not present

## 2015-11-23 DIAGNOSIS — N186 End stage renal disease: Secondary | ICD-10-CM | POA: Diagnosis not present

## 2015-11-23 DIAGNOSIS — D631 Anemia in chronic kidney disease: Secondary | ICD-10-CM | POA: Diagnosis not present

## 2015-11-23 DIAGNOSIS — Z992 Dependence on renal dialysis: Secondary | ICD-10-CM | POA: Diagnosis not present

## 2015-11-23 DIAGNOSIS — D509 Iron deficiency anemia, unspecified: Secondary | ICD-10-CM | POA: Diagnosis not present

## 2015-11-25 DIAGNOSIS — N2581 Secondary hyperparathyroidism of renal origin: Secondary | ICD-10-CM | POA: Diagnosis not present

## 2015-11-25 DIAGNOSIS — N186 End stage renal disease: Secondary | ICD-10-CM | POA: Diagnosis not present

## 2015-11-25 DIAGNOSIS — Z992 Dependence on renal dialysis: Secondary | ICD-10-CM | POA: Diagnosis not present

## 2015-11-25 DIAGNOSIS — D509 Iron deficiency anemia, unspecified: Secondary | ICD-10-CM | POA: Diagnosis not present

## 2015-11-25 DIAGNOSIS — D631 Anemia in chronic kidney disease: Secondary | ICD-10-CM | POA: Diagnosis not present

## 2015-11-27 DIAGNOSIS — N186 End stage renal disease: Secondary | ICD-10-CM | POA: Diagnosis not present

## 2015-11-27 DIAGNOSIS — D631 Anemia in chronic kidney disease: Secondary | ICD-10-CM | POA: Diagnosis not present

## 2015-11-27 DIAGNOSIS — Z992 Dependence on renal dialysis: Secondary | ICD-10-CM | POA: Diagnosis not present

## 2015-11-27 DIAGNOSIS — D509 Iron deficiency anemia, unspecified: Secondary | ICD-10-CM | POA: Diagnosis not present

## 2015-11-27 DIAGNOSIS — N2581 Secondary hyperparathyroidism of renal origin: Secondary | ICD-10-CM | POA: Diagnosis not present

## 2015-11-30 DIAGNOSIS — N2581 Secondary hyperparathyroidism of renal origin: Secondary | ICD-10-CM | POA: Diagnosis not present

## 2015-11-30 DIAGNOSIS — Z992 Dependence on renal dialysis: Secondary | ICD-10-CM | POA: Diagnosis not present

## 2015-11-30 DIAGNOSIS — D631 Anemia in chronic kidney disease: Secondary | ICD-10-CM | POA: Diagnosis not present

## 2015-11-30 DIAGNOSIS — D509 Iron deficiency anemia, unspecified: Secondary | ICD-10-CM | POA: Diagnosis not present

## 2015-11-30 DIAGNOSIS — N186 End stage renal disease: Secondary | ICD-10-CM | POA: Diagnosis not present

## 2015-12-01 ENCOUNTER — Emergency Department (HOSPITAL_COMMUNITY): Payer: Medicare Other

## 2015-12-01 ENCOUNTER — Encounter (HOSPITAL_COMMUNITY): Payer: Self-pay | Admitting: Emergency Medicine

## 2015-12-01 ENCOUNTER — Emergency Department (HOSPITAL_COMMUNITY)
Admission: EM | Admit: 2015-12-01 | Discharge: 2015-12-01 | Disposition: A | Discharge status: Home / Self Care | Payer: Medicare Other | Attending: Emergency Medicine | Admitting: Emergency Medicine

## 2015-12-01 DIAGNOSIS — S4991XA Unspecified injury of right shoulder and upper arm, initial encounter: Secondary | ICD-10-CM | POA: Diagnosis present

## 2015-12-01 DIAGNOSIS — Y939 Activity, unspecified: Secondary | ICD-10-CM | POA: Diagnosis not present

## 2015-12-01 DIAGNOSIS — E119 Type 2 diabetes mellitus without complications: Secondary | ICD-10-CM | POA: Insufficient documentation

## 2015-12-01 DIAGNOSIS — Z7982 Long term (current) use of aspirin: Secondary | ICD-10-CM | POA: Diagnosis not present

## 2015-12-01 DIAGNOSIS — S41112A Laceration without foreign body of left upper arm, initial encounter: Secondary | ICD-10-CM | POA: Diagnosis not present

## 2015-12-01 DIAGNOSIS — S42291A Other displaced fracture of upper end of right humerus, initial encounter for closed fracture: Secondary | ICD-10-CM

## 2015-12-01 DIAGNOSIS — N189 Chronic kidney disease, unspecified: Secondary | ICD-10-CM | POA: Diagnosis not present

## 2015-12-01 DIAGNOSIS — S42294A Other nondisplaced fracture of upper end of right humerus, initial encounter for closed fracture: Secondary | ICD-10-CM | POA: Diagnosis not present

## 2015-12-01 DIAGNOSIS — S80211A Abrasion, right knee, initial encounter: Secondary | ICD-10-CM | POA: Insufficient documentation

## 2015-12-01 DIAGNOSIS — W1839XA Other fall on same level, initial encounter: Secondary | ICD-10-CM | POA: Diagnosis not present

## 2015-12-01 DIAGNOSIS — Z79899 Other long term (current) drug therapy: Secondary | ICD-10-CM | POA: Diagnosis not present

## 2015-12-01 DIAGNOSIS — Y999 Unspecified external cause status: Secondary | ICD-10-CM | POA: Insufficient documentation

## 2015-12-01 DIAGNOSIS — I130 Hypertensive heart and chronic kidney disease with heart failure and stage 1 through stage 4 chronic kidney disease, or unspecified chronic kidney disease: Secondary | ICD-10-CM | POA: Insufficient documentation

## 2015-12-01 DIAGNOSIS — W19XXXA Unspecified fall, initial encounter: Secondary | ICD-10-CM

## 2015-12-01 DIAGNOSIS — Y929 Unspecified place or not applicable: Secondary | ICD-10-CM | POA: Insufficient documentation

## 2015-12-01 DIAGNOSIS — I5033 Acute on chronic diastolic (congestive) heart failure: Secondary | ICD-10-CM | POA: Insufficient documentation

## 2015-12-01 NOTE — ED Notes (Signed)
Pt reports tripping over newspaper stand at [employer]. Pt c/o pain to RT shoulder. Pt also reports laceration to LT elbow. Pt denies hitting head or LOC. Pt not on blood thinners. Ambulatory.

## 2015-12-01 NOTE — ED Notes (Signed)
Pain to r shoulder even with movement of bending elbow only. r shoulder is lower than left .  Mild abrasion noted to right knee. Nad.

## 2015-12-01 NOTE — ED Notes (Signed)
Patient verbalizes understanding of discharge instructions, prescriptions, home care and follow up care. Patient out of department at this time. 

## 2015-12-01 NOTE — ED Provider Notes (Signed)
CSN: [id number]     Arrival date & time 12/01/15  [id number] History   First MD Initiated Contact with Patient 12/01/15 1015     Chief Complaint  Patient presents with  . Fall     (Consider location/radiation/quality/duration/timing/severity/associated sxs/prior Treatment) HPI Brenda Lee Is a 65 year old female with past medical history of diabetes, hypertension, end-stage renal disease on hemodialysis, and atrial fibrillation who presents emergency Department with chief complaint of fall. Patient states that she got her foot caught in the news stand and had a mechanical fall. She took for her left arm on the new stand and fell onto her right shoulder. She denies hitting her head or losing consciousness. She complains of pain in the right shoulder. If she tries to raise or lower her arm. She has no pain at rest. Patient last dialyzed yesterday.  Past Medical History  Diagnosis Date  . Diabetes mellitus without complication ([place])   . Hypertension   . Anemia   . CKD (chronic kidney disease)   . CHF (congestive heart failure) ([place])   . Renal insufficiency   . Acute kidney injury ([place]) 06/08/2014  . Acute on chronic diastolic heart failure ([place]) 06/08/2014  . Pulmonary hypertension ([place]) 06/12/2014  . Atrial fibrillation with RVR ([place]) 06/11/2014   Past Surgical History  Procedure Laterality Date  . Jugular cath Right Feb. 2, 2016    HD  . Av fistula placement Left 09/07/2014    Procedure: ARTERIOVENOUS (AV) FISTULA CREATION-LEFT;  Surgeon: Elam Dutch, MD;  Location: [facility];  Service: Vascular;  Laterality: Left;   Family History  Problem Relation Age of Onset  . Aneurysm Mother     She fell and Hit head  . Diabetes Father   . Heart attack Father   . Heart disease Father     Before age 29   Social History  Substance Use Topics  . Smoking status: Never Smoker   . Smokeless tobacco: Never Used  . Alcohol Use: No   OB History    No data available     Review of Systems  Ten  systems reviewed and are negative for acute change, except as noted in the HPI.    Allergies  Morphine and related  Home Medications   Prior to Admission medications   Medication Sig Start Date End Date Taking? Authorizing Provider  aspirin 81 MG tablet Take 81 mg by mouth daily.    Historical Provider, MD  calcium acetate (PHOSLO) 667 MG capsule Take 667-2,668 mg by mouth See admin instructions. Take 3 tablets at each meal and 1 with snacks    Historical Provider, MD  diltiazem (CARDIZEM CD) 180 MG 24 hr capsule Take 1 capsule (180 mg total) by mouth daily. New medication for your blood pressure and heart rate. 06/14/14   Rexene Alberts, MD  fluticasone (FLONASE) 50 MCG/ACT nasal spray Place 2 sprays into both nostrils daily. 04/25/14   Merryl Hacker, MD  furosemide (LASIX) 40 MG tablet Take 120 mg by mouth daily.    Historical Provider, MD  glimepiride (AMARYL) 2 MG tablet Take one-half tablet daily only if your blood sugar is greater than 160. Check your blood sugar daily. Patient taking differently: Take 1 mg by mouth daily as needed (if blood sugar is greater than 160). Check blood sugar daily. 06/14/14   Rexene Alberts, MD  loratadine (CLARITIN) 10 MG tablet Take 10 mg by mouth daily.    Historical Provider, MD  metoprolol (LOPRESSOR) 50 MG  tablet Take 1 tablet (50 mg total) by mouth 2 (two) times daily. 07/14/15   Arnoldo Lenis, MD  NIFEdipine (ADALAT CC) 90 MG 24 hr tablet Take 90 mg by mouth daily. 10/05/14   Historical Provider, MD  omeprazole (PRILOSEC) 20 MG capsule Take 1 capsule (20 mg total) by mouth daily. For gastric acid reflux. 06/14/14   Rexene Alberts, MD  sodium chloride (OCEAN) 0.65 % SOLN nasal spray Place 1 spray into both nostrils as needed for congestion. 04/25/14   Merryl Hacker, MD  Vitamin D, Ergocalciferol, (DRISDOL) 50000 UNITS CAPS capsule Take 1 capsule (50,000 Units total) by mouth every 7 (seven) days. Patient taking differently: Take 50,000 Units by  mouth every 7 (seven) days. On Thursday. 06/14/14   Rexene Alberts, MD   BP 199/81 mmHg  Pulse 89  Temp(Src) 97.7 F (36.5 C) (Oral)  Resp 16  Ht 5\' 7"  (1.702 m)  Wt 77.111 kg  BMI 26.62 kg/m2  SpO2 96% Physical Exam  Constitutional: She is oriented to person, place, and time. She appears well-developed and well-nourished. No distress.  HENT:  Head: Normocephalic and atraumatic.  Eyes: Conjunctivae are normal. No scleral icterus.  Neck: Normal range of motion.  Cardiovascular: Normal rate, regular rhythm and normal heart sounds.  Exam reveals no gallop and no friction rub.   No murmur heard. AV fistula in the left forearm with a strong thrill  Pulmonary/Chest: Effort normal and breath sounds normal. No respiratory distress.  Abdominal: Soft. Bowel sounds are normal. She exhibits no distension and no mass. There is no tenderness. There is no guarding.  Musculoskeletal:  Tenderness to palpation of the proximal humerus. Patient has pain with any movement of the shoulder. She has pain with flexion of the elbow. Strong pulses. Range of motion limited due to the pain.  Neurological: She is alert and oriented to person, place, and time.  Skin: Skin is warm and dry. She is not diaphoretic.  5 cm skin tear of the left upper extremity. Bleeding is controlled  Abrasion to R knee  Nursing note and vitals reviewed.   ED Course  .Marland KitchenLaceration Repair Date/Time: 12/01/2015 5:02 PM Performed by: Margarita Mail Authorized by: Margarita Mail Consent: Verbal consent obtained. Risks and benefits: risks, benefits and alternatives were discussed Consent given by: patient Patient identity confirmed: verbally with patient Time out: Immediately prior to procedure a "time out" was called to verify the correct patient, procedure, equipment, support staff and site/side marked as required. Body area: upper extremity Location details: left upper arm Laceration length: 6 cm Preparation: Patient was  prepped and draped in the usual sterile fashion. Irrigation solution: saline Irrigation method: syringe Skin closure: glue and Steri-Strips Approximation: close Approximation difficulty: simple Patient tolerance: Patient tolerated the procedure well with no immediate complications   (including critical care time) Labs Review Labs Reviewed - No data to display  Imaging Review No results found. I have personally reviewed and evaluated these images and lab results as part of my medical decision-making.   EKG Interpretation None      MDM   Final diagnoses:  Fracture of humerus anatomical neck, right, closed, initial encounter  Skin tear of left upper arm without complication, initial encounter  Fall, initial encounter   Patient with a nondisplaced humeral neck fracture.  I reviewed images with Dr. Wyvonnia Dusky. Will place the patient in a sling.  Skin tear repaired easily  Patient's wounds cleansed and dressed.  F/u with ortho. NVI. I have discussed lab  and/or imaging findings with the patient and answered all questions/concerns to the best of my ability. The patient appears reasonably screened and/or stabilized for discharge and I doubt any other medical condition or other [facility] requiring further screening, evaluation, or treatment in the ED at this time prior to discharge.     Margarita Mail, PA-C 12/01/15 [place], MD 12/01/15 [phone]

## 2015-12-01 NOTE — Discharge Instructions (Signed)
Humerus Fracture Treated With Immobilization The humerus is the large bone in your upper arm. You have a broken (fractured) humerus. These fractures are easily diagnosed with X-rays. TREATMENT  Simple fractures which will heal without disability are treated with simple immobilization. Immobilization means you will wear a cast, splint, or sling. You have a fracture which will do well with immobilization. The fracture will heal well simply by being held in a good position until it is stable enough to begin range of motion exercises. Do not take part in activities which would further injure your arm.  HOME CARE INSTRUCTIONS  Put ice on the injured area. Put ice in a plastic bag. Place a towel between your skin and the bag. Leave the ice on for 15-20 minutes, 03-04 times a day. If you have a cast: Do not scratch the skin under the cast using sharp or pointed objects. Check the skin around the cast every day. You may put lotion on any red or sore areas. Keep your cast dry and clean. If you have a splint: Wear the splint as directed. Keep your splint dry and clean. You may loosen the elastic around the splint if your fingers become numb, tingle, or turn cold or blue. If you have a sling: Wear the sling as directed. Do not put pressure on any part of your cast or splint until it is fully hardened. Your cast or splint can be protected during bathing with a plastic bag. Do not lower the cast or splint into water. Only take over-the-counter or prescription medicines for pain, discomfort, or fever as directed by your caregiver. Do range of motion exercises as instructed by your caregiver. Follow up as directed by your caregiver. This is very important in order to avoid permanent injury or disability and chronic pain. SEEK IMMEDIATE MEDICAL CARE IF:  Your skin or nails in the injured arm turn blue or gray. Your arm feels cold or numb. You develop severe pain in the injured arm. You are having  problems with the medicines you were given. MAKE SURE YOU:  Understand these instructions. Will watch your condition. Will get help right away if you are not doing well or get worse.   This information is not intended to replace advice given to you by your health care provider. Make sure you discuss any questions you have with your health care provider.   Document Released: 08/28/2000 Document Revised: 06/12/2014 Document Reviewed: 10/14/2014 Elsevier Interactive Patient Education 2016 [place]. Tissue Adhesive Wound Care Some cuts, wounds, lacerations, and incisions can be repaired by using tissue adhesive. Tissue adhesive is like glue. It holds the skin together, allowing for faster healing. It forms a strong bond on the skin in about 1 minute and reaches its full strength in about 2 or 3 minutes. The adhesive disappears naturally while the wound is healing. It is important to take proper care of your wound at home while it heals.  HOME CARE INSTRUCTIONS   Showers are allowed. Do not soak the area containing the tissue adhesive. Do not take baths, swim, or use hot tubs. Do not use any soaps or ointments on the wound. Certain ointments can weaken the glue.  If a bandage (dressing) has been applied, follow your health care provider's instructions for how often to change the dressing.   Keep the dressing dry if one has been applied.   Do not scratch, pick, or rub the adhesive.   Do not place tape over the adhesive. The adhesive  could come off when pulling the tape off.   Protect the wound from further injury until it is healed.   Protect the wound from sun and tanning bed exposure while it is healing and for several weeks after healing.   Only take over-the-counter or prescription medicines as directed by your health care provider.   Keep all follow-up appointments as directed by your health care provider. SEEK IMMEDIATE MEDICAL CARE IF:   Your wound becomes red, swollen,  hot, or tender.   You develop a rash after the glue is applied.  You have increasing pain in the wound.   You have a red streak that goes away from the wound.   You have pus coming from the wound.   You have increased bleeding.  You have a fever.  You have shaking chills.   You notice a bad smell coming from the wound.   Your wound or adhesive breaks open.  MAKE SURE YOU:   Understand these instructions.  Will watch your condition.  Will get help right away if you are not doing well or get worse.   This information is not intended to replace advice given to you by your health care provider. Make sure you discuss any questions you have with your health care provider.   Document Released: 11/15/2000 Document Revised: 03/12/2013 Document Reviewed: 12/11/2012 Elsevier Interactive Patient Education [employer].

## 2015-12-02 ENCOUNTER — Encounter: Payer: Self-pay | Admitting: Orthopaedic Surgery

## 2015-12-02 ENCOUNTER — Ambulatory Visit (INDEPENDENT_AMBULATORY_CARE_PROVIDER_SITE_OTHER): Payer: Medicare Other | Admitting: Orthopaedic Surgery

## 2015-12-02 VITALS — BP 188/97 | HR 66 | Temp 97.7°F | Resp 16 | Ht 67.0 in | Wt 162.0 lb

## 2015-12-02 DIAGNOSIS — S42201A Unspecified fracture of upper end of right humerus, initial encounter for closed fracture: Secondary | ICD-10-CM

## 2015-12-02 DIAGNOSIS — N186 End stage renal disease: Secondary | ICD-10-CM | POA: Diagnosis not present

## 2015-12-02 DIAGNOSIS — Z992 Dependence on renal dialysis: Secondary | ICD-10-CM | POA: Diagnosis not present

## 2015-12-02 MED ORDER — HYDROCODONE-ACETAMINOPHEN 5-325 MG PO TABS: 1.0000 | ORAL_TABLET | ORAL | Status: DC | PRN

## 2015-12-02 NOTE — Progress Notes (Signed)
Subjective: I broke my right shoulder yesterday    Patient ID: Brenda Lee, female    DOB: March 26, 1951, 65 y.o.   MRN: [id number]  HPI She fell yesterday in front of the [place] store in town and hurt her right shoulder.  She was seen in the ER.  X-rays show nondisplaced fracture of the right proximal humerus.  She had no other injuries.  I have reviewed the ER records, the x-rays and x-ray reports.  She is in a sling.  She was given nothing for pain.  She is on dialysis having end stage renal disease.  She was to have gone in today but did not get dialysis because she is here today.  She will get back on schedule.  She has had problems with her heart and has had atrial fibrillation and heart failure.  She is doing well now but has to be careful.  Review of Systems  HENT: Negative for congestion.   Respiratory: Positive for shortness of breath. Negative for cough.   Cardiovascular: Positive for chest pain and palpitations. Negative for leg swelling.  Endocrine: Positive for cold intolerance.  Musculoskeletal: Positive for myalgias and arthralgias.  Allergic/Immunologic: Positive for environmental allergies.   Past Medical History  Diagnosis Date  . Diabetes mellitus without complication ([place])   . Hypertension   . Anemia   . CKD (chronic kidney disease)   . CHF (congestive heart failure) ([place])   . Renal insufficiency   . Acute kidney injury ([place]) 06/08/2014  . Acute on chronic diastolic heart failure ([place]) 06/08/2014  . Pulmonary hypertension ([place]) 06/12/2014  . Atrial fibrillation with RVR ([place]) 06/11/2014    Past Surgical History  Procedure Laterality Date  . Jugular cath Right Feb. 2, 2016    HD  . Av fistula placement Left 09/07/2014    Procedure: ARTERIOVENOUS (AV) FISTULA CREATION-LEFT;  Surgeon: Elam Dutch, MD;  Location: [facility] OR;  Service: Vascular;  Laterality: Left;    Current Outpatient Prescriptions on File Prior to Visit  Medication Sig Dispense Refill  .  aspirin 81 MG tablet Take 81 mg by mouth daily.    . calcium acetate (PHOSLO) 667 MG capsule Take 667-2,668 mg by mouth See admin instructions. Take 3 tablets at each meal and 1 with snacks    . diltiazem (CARDIZEM CD) 180 MG 24 hr capsule Take 1 capsule (180 mg total) by mouth daily. New medication for your blood pressure and heart rate. 30 capsule 3  . fluticasone (FLONASE) 50 MCG/ACT nasal spray Place 2 sprays into both nostrils daily. 16 g 2  . furosemide (LASIX) 40 MG tablet Take 120 mg by mouth daily.    Marland Kitchen glimepiride (AMARYL) 2 MG tablet Take one-half tablet daily only if your blood sugar is greater than 160. Check your blood sugar daily. (Patient taking differently: Take 1 mg by mouth daily as needed (if blood sugar is greater than 160). Check blood sugar daily.)    . loratadine (CLARITIN) 10 MG tablet Take 10 mg by mouth daily.    . metoprolol (LOPRESSOR) 50 MG tablet Take 1 tablet (50 mg total) by mouth 2 (two) times daily. 60 tablet 3  . NIFEdipine (ADALAT CC) 90 MG 24 hr tablet Take 90 mg by mouth daily.  5  . omeprazole (PRILOSEC) 20 MG capsule Take 1 capsule (20 mg total) by mouth daily. For gastric acid reflux. 30 capsule 1.  . sodium chloride (OCEAN) 0.65 % SOLN nasal spray Place 1 spray  into both nostrils as needed for congestion. 480 mL 0  . Vitamin D, Ergocalciferol, (DRISDOL) 50000 UNITS CAPS capsule Take 1 capsule (50,000 Units total) by mouth every 7 (seven) days. (Patient taking differently: Take 50,000 Units by mouth every 7 (seven) days. On Thursday.) 30 capsule 3   No current facility-administered medications on file prior to visit.    Social History   Social History  . Marital Status: Married    Spouse Name: N/A  . Number of Children: N/A  . Years of Education: N/A   Occupational History  . Not on file.   Social History Main Topics  . Smoking status: Never Smoker   . Smokeless tobacco: Never Used  . Alcohol Use: No  . Drug Use: No  . Sexual Activity: Not on  file   Other Topics Concern  . Not on file   Social History Narrative    BP 188/97 mmHg  Pulse 66  Temp(Src) 97.7 F (36.5 C)  Resp 16  Ht 5\' 7"  (1.702 m)  Wt 162 lb (73.483 kg)  BMI 25.37 kg/m2     Objective:   Physical Exam  Constitutional: She is oriented to person, place, and time. She appears well-developed and well-nourished.  HENT:  Head: Normocephalic and atraumatic.  Eyes: Conjunctivae and EOM are normal. Pupils are equal, round, and reactive to light.  Neck: Normal range of motion. Neck supple.  Cardiovascular: Normal rate, regular rhythm and intact distal pulses.   Pulmonary/Chest: Effort normal.  Abdominal: Soft.  Musculoskeletal: She exhibits tenderness (The right shoulder is tender and has ecchymosis proximal upper arm, motion limited secondary to pain.  NV intact.  Neck normal, left shoudler normal.).  Neurological: She is alert and oriented to person, place, and time. She displays normal reflexes. No cranial nerve deficit. She exhibits normal muscle tone. Coordination normal.  Skin: Skin is warm and dry.  Psychiatric: She has a normal mood and affect. Her behavior is normal. Judgment and thought content normal.          Assessment & Plan:   Encounter Diagnoses  Name Primary?  . Closed fracture of right proximal humerus, initial encounter Yes  . ESRD on dialysis [facility])    She is to wear the sling all times except for bathing.  She is to sleep in [place] chair positioning.  She is to continue dialysis of course and we can arrange to see her next week to accommodate her schedule.  Return in one week  X-rays of the right shoulder on return.  Call if any problem  Precautions discussed.  Electronically Signed Sanjuana Kava, MD 6/29/20173:57 PM

## 2015-12-03 DIAGNOSIS — N186 End stage renal disease: Secondary | ICD-10-CM | POA: Diagnosis not present

## 2015-12-03 DIAGNOSIS — Z992 Dependence on renal dialysis: Secondary | ICD-10-CM | POA: Diagnosis not present

## 2015-12-04 DIAGNOSIS — E162 Hypoglycemia, unspecified: Secondary | ICD-10-CM | POA: Diagnosis not present

## 2015-12-04 DIAGNOSIS — E11649 Type 2 diabetes mellitus with hypoglycemia without coma: Secondary | ICD-10-CM | POA: Diagnosis not present

## 2015-12-04 DIAGNOSIS — N186 End stage renal disease: Secondary | ICD-10-CM | POA: Diagnosis not present

## 2015-12-04 DIAGNOSIS — Z992 Dependence on renal dialysis: Secondary | ICD-10-CM | POA: Diagnosis not present

## 2015-12-04 DIAGNOSIS — N2581 Secondary hyperparathyroidism of renal origin: Secondary | ICD-10-CM | POA: Diagnosis not present

## 2015-12-04 DIAGNOSIS — Z794 Long term (current) use of insulin: Secondary | ICD-10-CM | POA: Diagnosis not present

## 2015-12-04 DIAGNOSIS — D509 Iron deficiency anemia, unspecified: Secondary | ICD-10-CM | POA: Diagnosis not present

## 2015-12-04 DIAGNOSIS — D631 Anemia in chronic kidney disease: Secondary | ICD-10-CM | POA: Diagnosis not present

## 2015-12-07 DIAGNOSIS — N186 End stage renal disease: Secondary | ICD-10-CM | POA: Diagnosis not present

## 2015-12-07 DIAGNOSIS — N2581 Secondary hyperparathyroidism of renal origin: Secondary | ICD-10-CM | POA: Diagnosis not present

## 2015-12-07 DIAGNOSIS — D631 Anemia in chronic kidney disease: Secondary | ICD-10-CM | POA: Diagnosis not present

## 2015-12-07 DIAGNOSIS — Z992 Dependence on renal dialysis: Secondary | ICD-10-CM | POA: Diagnosis not present

## 2015-12-07 DIAGNOSIS — E162 Hypoglycemia, unspecified: Secondary | ICD-10-CM | POA: Diagnosis not present

## 2015-12-07 DIAGNOSIS — D509 Iron deficiency anemia, unspecified: Secondary | ICD-10-CM | POA: Diagnosis not present

## 2015-12-08 ENCOUNTER — Encounter: Payer: Self-pay | Admitting: Orthopaedic Surgery

## 2015-12-08 ENCOUNTER — Ambulatory Visit: Payer: Medicare Other | Admitting: Orthopaedic Surgery

## 2015-12-08 ENCOUNTER — Ambulatory Visit (INDEPENDENT_AMBULATORY_CARE_PROVIDER_SITE_OTHER): Payer: Medicare Other

## 2015-12-08 VITALS — Temp 97.7°F | Ht 67.0 in | Wt 165.0 lb

## 2015-12-08 DIAGNOSIS — S42201D Unspecified fracture of upper end of right humerus, subsequent encounter for fracture with routine healing: Secondary | ICD-10-CM

## 2015-12-08 NOTE — Progress Notes (Signed)
CC:  I feel better  Her right shoulder has less pain.  She is using the sling.  She is sleeping semirecumbent.  She has no new trauma.  NV is intact. She has resolving ecchymosis of the right upper arm.  Encounter Diagnosis  Name Primary?  . Closed fracture of right proximal humerus, with routine healing, subsequent encounter Yes    Continue the sling.  I will see her in two weeks.   X-rays then  Consider OT then.  Call if any problem.  Precautions discussed.  Electronically Signed Sanjuana Kava, MD 7/5/20172:45 PM

## 2015-12-09 DIAGNOSIS — D631 Anemia in chronic kidney disease: Secondary | ICD-10-CM | POA: Diagnosis not present

## 2015-12-09 DIAGNOSIS — Z992 Dependence on renal dialysis: Secondary | ICD-10-CM | POA: Diagnosis not present

## 2015-12-09 DIAGNOSIS — N2581 Secondary hyperparathyroidism of renal origin: Secondary | ICD-10-CM | POA: Diagnosis not present

## 2015-12-09 DIAGNOSIS — D509 Iron deficiency anemia, unspecified: Secondary | ICD-10-CM | POA: Diagnosis not present

## 2015-12-09 DIAGNOSIS — E162 Hypoglycemia, unspecified: Secondary | ICD-10-CM | POA: Diagnosis not present

## 2015-12-09 DIAGNOSIS — N186 End stage renal disease: Secondary | ICD-10-CM | POA: Diagnosis not present

## 2015-12-11 DIAGNOSIS — E162 Hypoglycemia, unspecified: Secondary | ICD-10-CM | POA: Diagnosis not present

## 2015-12-11 DIAGNOSIS — D631 Anemia in chronic kidney disease: Secondary | ICD-10-CM | POA: Diagnosis not present

## 2015-12-11 DIAGNOSIS — Z992 Dependence on renal dialysis: Secondary | ICD-10-CM | POA: Diagnosis not present

## 2015-12-11 DIAGNOSIS — N2581 Secondary hyperparathyroidism of renal origin: Secondary | ICD-10-CM | POA: Diagnosis not present

## 2015-12-11 DIAGNOSIS — D509 Iron deficiency anemia, unspecified: Secondary | ICD-10-CM | POA: Diagnosis not present

## 2015-12-11 DIAGNOSIS — N186 End stage renal disease: Secondary | ICD-10-CM | POA: Diagnosis not present

## 2015-12-14 DIAGNOSIS — N2581 Secondary hyperparathyroidism of renal origin: Secondary | ICD-10-CM | POA: Diagnosis not present

## 2015-12-14 DIAGNOSIS — N186 End stage renal disease: Secondary | ICD-10-CM | POA: Diagnosis not present

## 2015-12-14 DIAGNOSIS — D631 Anemia in chronic kidney disease: Secondary | ICD-10-CM | POA: Diagnosis not present

## 2015-12-14 DIAGNOSIS — Z992 Dependence on renal dialysis: Secondary | ICD-10-CM | POA: Diagnosis not present

## 2015-12-14 DIAGNOSIS — E162 Hypoglycemia, unspecified: Secondary | ICD-10-CM | POA: Diagnosis not present

## 2015-12-14 DIAGNOSIS — D509 Iron deficiency anemia, unspecified: Secondary | ICD-10-CM | POA: Diagnosis not present

## 2015-12-16 DIAGNOSIS — D509 Iron deficiency anemia, unspecified: Secondary | ICD-10-CM | POA: Diagnosis not present

## 2015-12-16 DIAGNOSIS — N2581 Secondary hyperparathyroidism of renal origin: Secondary | ICD-10-CM | POA: Diagnosis not present

## 2015-12-16 DIAGNOSIS — Z992 Dependence on renal dialysis: Secondary | ICD-10-CM | POA: Diagnosis not present

## 2015-12-16 DIAGNOSIS — E162 Hypoglycemia, unspecified: Secondary | ICD-10-CM | POA: Diagnosis not present

## 2015-12-16 DIAGNOSIS — D631 Anemia in chronic kidney disease: Secondary | ICD-10-CM | POA: Diagnosis not present

## 2015-12-16 DIAGNOSIS — N186 End stage renal disease: Secondary | ICD-10-CM | POA: Diagnosis not present

## 2015-12-18 DIAGNOSIS — D631 Anemia in chronic kidney disease: Secondary | ICD-10-CM | POA: Diagnosis not present

## 2015-12-18 DIAGNOSIS — N186 End stage renal disease: Secondary | ICD-10-CM | POA: Diagnosis not present

## 2015-12-18 DIAGNOSIS — E162 Hypoglycemia, unspecified: Secondary | ICD-10-CM | POA: Diagnosis not present

## 2015-12-18 DIAGNOSIS — Z992 Dependence on renal dialysis: Secondary | ICD-10-CM | POA: Diagnosis not present

## 2015-12-18 DIAGNOSIS — D509 Iron deficiency anemia, unspecified: Secondary | ICD-10-CM | POA: Diagnosis not present

## 2015-12-18 DIAGNOSIS — N2581 Secondary hyperparathyroidism of renal origin: Secondary | ICD-10-CM | POA: Diagnosis not present

## 2015-12-21 DIAGNOSIS — Z992 Dependence on renal dialysis: Secondary | ICD-10-CM | POA: Diagnosis not present

## 2015-12-21 DIAGNOSIS — D631 Anemia in chronic kidney disease: Secondary | ICD-10-CM | POA: Diagnosis not present

## 2015-12-21 DIAGNOSIS — N186 End stage renal disease: Secondary | ICD-10-CM | POA: Diagnosis not present

## 2015-12-21 DIAGNOSIS — N2581 Secondary hyperparathyroidism of renal origin: Secondary | ICD-10-CM | POA: Diagnosis not present

## 2015-12-21 DIAGNOSIS — E162 Hypoglycemia, unspecified: Secondary | ICD-10-CM | POA: Diagnosis not present

## 2015-12-21 DIAGNOSIS — D509 Iron deficiency anemia, unspecified: Secondary | ICD-10-CM | POA: Diagnosis not present

## 2015-12-22 ENCOUNTER — Ambulatory Visit (HOSPITAL_COMMUNITY)
Admission: RE | Admit: 2015-12-22 | Discharge: 2015-12-22 | Disposition: A | Discharge status: Home / Self Care | Payer: Medicare Other | Source: Ambulatory Visit | Attending: Orthopaedic Surgery | Admitting: Orthopaedic Surgery

## 2015-12-22 ENCOUNTER — Encounter: Payer: Self-pay | Admitting: Orthopaedic Surgery

## 2015-12-22 ENCOUNTER — Other Ambulatory Visit: Payer: Self-pay | Admitting: Radiology

## 2015-12-22 ENCOUNTER — Ambulatory Visit: Payer: Medicare Other | Admitting: Orthopaedic Surgery

## 2015-12-22 VITALS — Temp 97.5°F | Ht 65.0 in | Wt 169.0 lb

## 2015-12-22 DIAGNOSIS — S42201D Unspecified fracture of upper end of right humerus, subsequent encounter for fracture with routine healing: Secondary | ICD-10-CM

## 2015-12-22 DIAGNOSIS — M19011 Primary osteoarthritis, right shoulder: Secondary | ICD-10-CM | POA: Insufficient documentation

## 2015-12-22 DIAGNOSIS — X58XXXD Exposure to other specified factors, subsequent encounter: Secondary | ICD-10-CM | POA: Diagnosis not present

## 2015-12-22 DIAGNOSIS — M25511 Pain in right shoulder: Secondary | ICD-10-CM

## 2015-12-22 DIAGNOSIS — S49091A Other physeal fracture of upper end of humerus, right arm, initial encounter for closed fracture: Secondary | ICD-10-CM | POA: Diagnosis not present

## 2015-12-22 NOTE — Patient Instructions (Signed)
Begin Occupational Therapy at [facility].

## 2015-12-22 NOTE — Progress Notes (Signed)
CC:  My right shoulder is feeling OK  She has been in the sling on the right for the fracture of the right proximal humerus.  She had x-rays done at the hospital today as our unit is down.  She has no new trauma, no paresthesias.  NV intact.  Sling OK.  Encounter Diagnosis  Name Primary?  . Closed fracture of right proximal humerus, with routine healing, subsequent encounter Yes    Begin OT for the shoulder.  Precautions discussed.  X-rays on return in two weeks.  Call if any problem.  Electronically Signed Sanjuana Kava, MD 7/19/201712:54 PM

## 2015-12-23 DIAGNOSIS — D631 Anemia in chronic kidney disease: Secondary | ICD-10-CM | POA: Diagnosis not present

## 2015-12-23 DIAGNOSIS — E162 Hypoglycemia, unspecified: Secondary | ICD-10-CM | POA: Diagnosis not present

## 2015-12-23 DIAGNOSIS — N186 End stage renal disease: Secondary | ICD-10-CM | POA: Diagnosis not present

## 2015-12-23 DIAGNOSIS — D509 Iron deficiency anemia, unspecified: Secondary | ICD-10-CM | POA: Diagnosis not present

## 2015-12-23 DIAGNOSIS — N2581 Secondary hyperparathyroidism of renal origin: Secondary | ICD-10-CM | POA: Diagnosis not present

## 2015-12-23 DIAGNOSIS — Z992 Dependence on renal dialysis: Secondary | ICD-10-CM | POA: Diagnosis not present

## 2015-12-24 ENCOUNTER — Encounter (HOSPITAL_COMMUNITY): Payer: Self-pay

## 2015-12-24 ENCOUNTER — Ambulatory Visit (HOSPITAL_COMMUNITY): Payer: Medicare Other | Attending: Orthopaedic Surgery

## 2015-12-24 DIAGNOSIS — R29898 Other symptoms and signs involving the musculoskeletal system: Secondary | ICD-10-CM | POA: Insufficient documentation

## 2015-12-24 DIAGNOSIS — M25611 Stiffness of right shoulder, not elsewhere classified: Secondary | ICD-10-CM | POA: Insufficient documentation

## 2015-12-24 DIAGNOSIS — M25511 Pain in right shoulder: Secondary | ICD-10-CM | POA: Diagnosis not present

## 2015-12-24 NOTE — Patient Instructions (Signed)
TOWEL SLIDES COMPLETE FOR 1-3 MINUTES, 3-5 TIMES PER DAY  SHOULDER: Flexion On Table   Place hands on table, elbows straight. Move hips away from body. Press hands down into table. Hold ___ seconds. ___ reps per set, ___ sets per day, ___ days per week  Abduction (Passive)   With arm out to side, resting on table, lower head toward arm, keeping trunk away from table. Hold ____ seconds. Repeat ____ times. Do ____ sessions per day.  Copyright  VHI. All rights reserved.     Internal Rotation (Assistive)   Seated with elbow bent at right angle and held against side, slide arm on table surface in an inward arc. Repeat ____ times. Do ____ sessions per day. Activity: Use this motion to brush crumbs off the table.  Copyright  VHI. All rights reserved.    COMPLETE PENDULUM EXERCISES FOR 30 SECONDS TO A MINUTE EACH, 3-5 TIMES PER DAY. ROM: Pendulum (Side-to-Side)   Deje que el brazo derecho se balancee suavemente de lado a lado mientras oscila el cuerpo de [place]. Repita ____ veces por rutina. Realice ____ rutinas por sesin. Realice ____ sesiones por da.  http://orth.exer.us/792   Copyright  VHI. All rights reserved.  Pendulum Forward/Back   Bend forward 90 at waist, using table for support. Rock body forward and back to swing arm. Repeat ____ times. Do ____ sessions per day.  Copyright  VHI. All rights reserved.  Pendulum Circular   Bend forward 90 at waist, leaning on table for support. Rock body in a circular pattern to move arm clockwise ____ times then counterclockwise ____ times. Do ____ sessions per day.  Copyright  VHI. All rights reserved.  AROM: Wrist Extension   With right palm down, bend wrist up. Repeat 10____ times per set. Do ____ sets per session. Do __3__ sessions per day.  Copyright  VHI. All rights reserved.   AROM: Wrist Flexion   With right palm up, bend wrist up. Repeat ___10_ times per set. Do ____ sets per session. Do __3__  sessions per day.  Copyright  VHI. All rights reserved.   AROM: Forearm Pronation / Supination   With right arm in handshake position, slowly rotate palm down until stretch is felt. Relax. Then rotate palm up until stretch is felt. Repeat __10__ times per set. Do ____ sets per session. Do __3__ sessions per day.      ELBOW FLEXION EXTENSION  Bend your elbow upwards as shown and then lower to a straighten position. Complete 10 reps.

## 2015-12-24 NOTE — Therapy (Signed)
[place], [place], [address] Phone: [phone]   Fax:  [phone]  Occupational Therapy Evaluation  Patient Details  Name: Brenda Lee MRN: [id number] Date of Birth: 1951/05/05 Referring Provider: Sanjuana Kava, MD  Encounter Date: 12/24/2015      OT End of Session - 12/24/15 1157    Visit Number 1   Number of Visits 16   Date for OT Re-Evaluation 02/18/16   Authorization Type 1) medicare Part A 2) BCBS supplement   Authorization Time Period before 10th visit   Authorization - Visit Number 1   Authorization - Number of Visits 10   OT Start Time 1115   OT Stop Time 1148   OT Time Calculation (min) 33 min   Activity Tolerance Patient tolerated treatment well   Behavior During Therapy [facility] for tasks assessed/performed      Past Medical History  Diagnosis Date  . Diabetes mellitus without complication ([place])   . Hypertension   . Anemia   . CKD (chronic kidney disease)   . CHF (congestive heart failure) ([place])   . Renal insufficiency   . Acute kidney injury ([place]) 06/08/2014  . Acute on chronic diastolic heart failure ([place]) 06/08/2014  . Pulmonary hypertension ([place]) 06/12/2014  . Atrial fibrillation with RVR ([place]) 06/11/2014    Past Surgical History  Procedure Laterality Date  . Jugular cath Right Feb. 2, 2016    HD  . Av fistula placement Left 09/07/2014    Procedure: ARTERIOVENOUS (AV) FISTULA CREATION-LEFT;  Surgeon: Elam Dutch, MD;  Location: [facility];  Service: Vascular;  Laterality: Left;    There were no vitals filed for this visit.      Subjective Assessment - 12/24/15 1153    Subjective  S: I just want to be able to use this arm normal.   Pertinent History Patient is a 65 y/o female S/P right closed proximal humerus fracture that occured on 12/01/15 when she fell outside. pt reports that she tripped over her feet when wearing flip flops. Patient is currently wearing a sling although she was  told that she may start to wean off wearing the sling all the time. Dr. Luna Glasgow has referred patient to occupational therapy for evaluation and treatment.    Special Tests FOTO score: 32/100    Patient Stated Goals To be able to use arm normally.   Currently in Pain? Yes   Pain Score 4    Pain Location Arm   Pain Orientation Right   Pain Descriptors / Indicators Aching   Pain Type Acute pain   Pain Radiating Towards N/A   Pain Onset 1 to 4 weeks ago   Pain Frequency Constant   Aggravating Factors  Movement and use   Pain Relieving Factors pain medication   Effect of Pain on Daily Activities patient is unable to use RUE for daily tasks at this time.   Multiple Pain Sites No           [facility] OT Assessment - 12/24/15 1108    Assessment   Diagnosis closed fracture right proximal humerus   Referring Provider Sanjuana Kava, MD   Onset Date 12/01/15   Assessment 01/05/16 Luna Glasgow   Prior Therapy None for this condition   Precautions   Precautions None   Required Braces or Orthoses Sling   Restrictions   Weight Bearing Restrictions No   Balance Screen   Has the patient fallen in the past 6 months  Yes   How many times? 1   Has the patient had a decrease in activity level because of a fear of falling?  No   Is the patient reluctant to leave their home because of a fear of falling?  No   Home  Environment   Family/patient expects to be discharged to: Private residence   Lives With Spouse   Prior Function   Level of Independence Independent   Vocation Retired   Leisure Loves to read. Pt has 4 grandchildren   ADL   ADL comments Difficulty combing hair, getting dressed, cooking, taking bath, doing laundry, household stuff.    Mobility   Mobility Status Independent   Written Expression   Dominant Hand Right   Vision - History   Baseline Vision No visual deficits   Cognition   Overall Cognitive Status Within Functional Limits for tasks assessed   ROM / Strength   AROM / PROM /  Strength AROM;PROM;Strength   Palpation   Palpation comment Max fascial restrictions in right upper arm, trapezius, and scapularis region.   AROM   Overall AROM  Unable to assess;Due to pain   AROM Assessment Site Shoulder   Right/Left Shoulder Right   PROM   Overall PROM Comments Assessed supine. IR/er adducted   PROM Assessment Site Shoulder;Elbow   Right/Left Shoulder Right   Right Shoulder Flexion 105 Degrees   Right Shoulder ABduction 88 Degrees   Right Shoulder Internal Rotation 90 Degrees   Right Shoulder External Rotation 15 Degrees   Right/Left Elbow Right   Right Elbow Flexion 130   Right Elbow Extension -12   Strength   Overall Strength Unable to assess;Due to pain   Strength Assessment Site Shoulder   Right/Left Shoulder Right                         OT Education - 12/24/15 1157    Education provided Yes   Education Details Table slides, pendulums, and wrist and elbow A/ROM exercises   Person(s) Educated Patient   Methods Explanation;Demonstration;Verbal cues;Handout   Comprehension Verbalized understanding          OT Short Term Goals - 12/24/15 1253    OT SHORT TERM GOAL #1   Title Patient will be educated and independent with HEP to increase functional use of RUE during daily tasks.    Time 4   Period Weeks   Status New   OT SHORT TERM GOAL #2   Title Patient will increase P/ROM of shoulder and elbow  to [facility] to increase ability to get shirts on and off with less difficulty.    Time 4   Period Weeks   Status New   OT SHORT TERM GOAL #3   Title Patient will increase strength to 3+/5 to increase ability to complete tasks below shoulder.    Time 4   Period Weeks   Status New   OT SHORT TERM GOAL #4   Title Patient will decrease fascial restrictions to min amount to increase functional mobility of RUE needed for reaching tasks.    Time 4   Period Weeks   Status New   OT SHORT TERM GOAL #5   Title Patient will decrease pain level to  2/10 or less when using RUE during household activities.    Time 4   Period Weeks   Status New           OT Long Term Goals - 12/24/15 1329  OT LONG TERM GOAL #1   Title Patient will return to highest level of independence with all daily and leisure tasks using RUE.    Time 8   Period Weeks   Status New   OT LONG TERM GOAL #2   Title Patient will increase A/ROM to [facility] in shoulder and elbow to increase ability to brush hair and reach into overhead cabinets.    Time 8   Period Weeks   Status New   OT LONG TERM GOAL #3   Title Patient will increase shoulder strength to 4+/5 to increase ability to complete normal household chores with RUE.   Time 8   Period Weeks   Status New   OT LONG TERM GOAL #4   Title Patient will decrease pain level to 1/10 when using RUE during daily tasks.   Time 8   Period Weeks   Status New   OT LONG TERM GOAL #5   Title Patient will decrease fascial restrictions to min amount to increase functional mobility to complete reaching tasks.   Time 8   Period Weeks   Status New               Plan - 12/24/15 1249    Clinical Impression Statement A: Patient is a 65 y/o female S/P right proximal humerus fracture causing increased pain, fascial restrictions and decreased strength and ROM resulting in difficulty completing daily tasks using RUE as dominant extremity.    Rehab Potential Excellent   Clinical Impairments Affecting Rehab Potential Pt completes dialysis T-TH-Sat   OT Frequency 2x / week   OT Duration 8 weeks   OT Treatment/Interventions Self-care/ADL training;Ultrasound;DME and/or AE instruction;Patient/family education;Passive range of motion;Cryotherapy;Electrical Stimulation;Moist Heat;Therapeutic activities;Therapeutic exercises;Manual Therapy   Plan P: Pt will benefit from skilled OT services to increase functional performance during daily tasks using RUE. Treatment Plan: Myofascial release, manual stretching, P/ROM, progress to  AA/ROM when able to and then A/ROM. General strengthening and scapular strengthening exercises.    Consulted and Agree with Plan of Care Patient      Patient will benefit from skilled therapeutic intervention in order to improve the following deficits and impairments:  Decreased strength, Pain, Impaired UE functional use, Increased edema, Decreased range of motion, Increased fascial restricitons  Visit Diagnosis: Pain in right shoulder - Plan: Ot plan of care cert/re-cert  Stiffness of right shoulder, not elsewhere classified - Plan: Ot plan of care cert/re-cert  Other symptoms and signs involving the musculoskeletal system - Plan: Ot plan of care cert/re-cert      G-Codes - [id number]    Functional Assessment Tool Used FOTO score: 32/100 (68% impaired)   Functional Limitation Carrying, moving and handling objects   Carrying, Moving and Handling Objects Current Status [id number]) At least 60 percent but less than 80 percent impaired, limited or restricted   Carrying, Moving and Handling Objects Goal Status [id number]) At least 20 percent but less than 40 percent impaired, limited or restricted      Problem List Patient Active Problem List   Diagnosis Date Noted  . Hypokalemia 06/12/2014  . Pulmonary hypertension ([place]) 06/12/2014  . UTI (urinary tract infection) 06/11/2014  . Atrial fibrillation with RVR ([place]) 06/11/2014  . Acute diastolic heart failure ([place]) 06/11/2014  . A-fib ([place])   . Acute respiratory failure ([place]) 06/10/2014  . Metabolic acidosis [id number]  . Acute kidney injury ([place]) 06/08/2014  . Syncope and collapse 06/08/2014  . Normocytic anemia 06/08/2014  . Diabetes mellitus without  complication ([place]) [id number]  . Essential hypertension 06/08/2014  . Hyperlipidemia 06/08/2014  . Edema extremities 06/08/2014  . Acute on chronic diastolic heart failure ([place]) 06/08/2014  . Faintness   . Contusion of face     Ailene Ravel, OTR/L,CBIS  [phone]   12/24/2015, 2:03 PM  [facility] [address] [place], [place], [address] Phone: [phone]   Fax:  [phone]  Name: Brenda Lee MRN: [id number] Date of Birth: 1950/06/16

## 2015-12-25 DIAGNOSIS — N186 End stage renal disease: Secondary | ICD-10-CM | POA: Diagnosis not present

## 2015-12-25 DIAGNOSIS — D631 Anemia in chronic kidney disease: Secondary | ICD-10-CM | POA: Diagnosis not present

## 2015-12-25 DIAGNOSIS — N2581 Secondary hyperparathyroidism of renal origin: Secondary | ICD-10-CM | POA: Diagnosis not present

## 2015-12-25 DIAGNOSIS — E162 Hypoglycemia, unspecified: Secondary | ICD-10-CM | POA: Diagnosis not present

## 2015-12-25 DIAGNOSIS — D509 Iron deficiency anemia, unspecified: Secondary | ICD-10-CM | POA: Diagnosis not present

## 2015-12-25 DIAGNOSIS — Z992 Dependence on renal dialysis: Secondary | ICD-10-CM | POA: Diagnosis not present

## 2015-12-27 ENCOUNTER — Encounter (HOSPITAL_COMMUNITY): Payer: Self-pay

## 2015-12-27 ENCOUNTER — Ambulatory Visit (HOSPITAL_COMMUNITY): Payer: Medicare Other

## 2015-12-27 DIAGNOSIS — R29898 Other symptoms and signs involving the musculoskeletal system: Secondary | ICD-10-CM

## 2015-12-27 DIAGNOSIS — M25611 Stiffness of right shoulder, not elsewhere classified: Secondary | ICD-10-CM

## 2015-12-27 DIAGNOSIS — M25511 Pain in right shoulder: Secondary | ICD-10-CM | POA: Diagnosis not present

## 2015-12-27 NOTE — Therapy (Signed)
[place], [place], [address] Phone: [phone]   Fax:  [phone]  Occupational Therapy Treatment  Patient Details  Name: Brenda Lee MRN: [id number] Date of Birth: 02-19-1951 Referring Provider: Sanjuana Kava, MD  Encounter Date: 12/27/2015      OT End of Session - 12/27/15 1237    Visit Number 2   Number of Visits 16   Date for OT Re-Evaluation 02/18/16   Authorization Type 1) medicare Part A 2) BCBS supplement   Authorization Time Period before 10th visit   Authorization - Visit Number 2   Authorization - Number of Visits 10   OT Start Time 1115   OT Stop Time 1200   OT Time Calculation (min) 45 min   Activity Tolerance Patient tolerated treatment well   Behavior During Therapy [facility] for tasks assessed/performed      Past Medical History:  Diagnosis Date  . Acute kidney injury ([place]) 06/08/2014  . Acute on chronic diastolic heart failure ([place]) 06/08/2014  . Anemia   . Atrial fibrillation with RVR ([place]) 06/11/2014  . CHF (congestive heart failure) ([place])   . CKD (chronic kidney disease)   . Diabetes mellitus without complication ([place])   . Hypertension   . Pulmonary hypertension ([place]) 06/12/2014  . Renal insufficiency     Past Surgical History:  Procedure Laterality Date  . AV FISTULA PLACEMENT Left 09/07/2014   Procedure: ARTERIOVENOUS (AV) FISTULA CREATION-LEFT;  Surgeon: Elam Dutch, MD;  Location[place];  Service: Vascular;  Laterality: Left;  . Jugular Cath Right Feb. 2, 2016   HD    There were no vitals filed for this visit.      Subjective Assessment - 12/27/15 1139    Subjective  S: I did some of my exercises.    Currently in Pain? No/denies            [facility] OT Assessment - 12/27/15 1139      Assessment   Diagnosis closed fracture right proximal humerus     Precautions   Precautions None   Required Braces or Orthoses Sling                  OT  Treatments/Exercises (OP) - 12/27/15 1139      Exercises   Exercises Shoulder     Shoulder Exercises: Supine   Protraction PROM;10 reps   Horizontal ABduction PROM;10 reps   External Rotation PROM;10 reps   Internal Rotation PROM;10 reps   Flexion PROM;10 reps   ABduction PROM;10 reps     Shoulder Exercises: Seated   Elevation AROM;10 reps   Extension AROM;10 reps   Row AROM;10 reps     Shoulder Exercises: Therapy Ball   Flexion 10 reps   ABduction 10 reps     Shoulder Exercises: ROM/Strengthening   Thumb Tacks low level 1'  therapist off loading arm   Anterior Glide 3x1-"   Prot/Ret//Elev/Dep 1'  physical and VC required     Manual Therapy   Manual Therapy Myofascial release   Manual therapy comments manual therapy completed prior to exercises.    Myofascial Release Myofascial release and manual stretching completed to right upper arm, trapezius, and scapularis region to decrease fascial restrictions and increase joint mobility in a pain free zone.                 OT Education - 12/27/15 1234    Education provided Yes   Education Details Pt  was given print out of OT evaluation. Reviewed POC and goals    Person(s) Educated Patient   Methods Explanation;Handout   Comprehension Verbalized understanding          OT Short Term Goals - 12/27/15 1241      OT SHORT TERM GOAL #1   Title Patient will be educated and independent with HEP to increase functional use of RUE during daily tasks.    Time 4   Period Weeks   Status On-going     OT SHORT TERM GOAL #2   Title Patient will increase P/ROM of shoulder and elbow  to [facility] to increase ability to get shirts on and off with less difficulty.    Time 4   Period Weeks   Status On-going     OT SHORT TERM GOAL #3   Title Patient will increase strength to 3+/5 to increase ability to complete tasks below shoulder.    Time 4   Period Weeks   Status On-going     OT SHORT TERM GOAL #4   Title Patient will  decrease fascial restrictions to min amount to increase functional mobility of RUE needed for reaching tasks.    Time 4   Period Weeks   Status On-going     OT SHORT TERM GOAL #5   Title Patient will decrease pain level to 2/10 or less when using RUE during household activities.    Time 4   Period Weeks   Status On-going           OT Long Term Goals - 12/27/15 1242      OT LONG TERM GOAL #1   Title Patient will return to highest level of independence with all daily and leisure tasks using RUE.    Time 8   Period Weeks   Status On-going     OT LONG TERM GOAL #2   Title Patient will increase A/ROM to [facility] in shoulder and elbow to increase ability to brush hair and reach into overhead cabinets.    Time 8   Period Weeks   Status On-going     OT LONG TERM GOAL #3   Title Patient will increase shoulder strength to 4+/5 to increase ability to complete normal household chores with RUE.   Time 8   Period Weeks   Status On-going     OT LONG TERM GOAL #4   Title Patient will decrease pain level to 1/10 when using RUE during daily tasks.   Time 8   Period Weeks   Status On-going     OT LONG TERM GOAL #5   Title Patient will decrease fascial restrictions to min amount to increase functional mobility to complete reaching tasks.   Time 8   Period Weeks   Status On-going               Plan - 12/27/15 1237    Clinical Impression Statement A: Initiated myofascial release, passive stretching, therapy ball stretches, and scapular A/ROM seated exercises. Patient increased pain and only able to tolerate passive range to 90 degrees of shoulder flexion and abduction. VC needed for form and technique during exercises.  Pt with limited scapular mobility and decreased posture sitting and standing.    Plan P: Add isometric exercises.       Patient will benefit from skilled therapeutic intervention in order to improve the following deficits and impairments:  Decreased strength, Pain,  Impaired UE functional use, Increased edema, Decreased range of motion,  Increased fascial restricitons  Visit Diagnosis: Pain in right shoulder  Stiffness of right shoulder, not elsewhere classified  Other symptoms and signs involving the musculoskeletal system    Problem List Patient Active Problem List   Diagnosis Date Noted  . Hypokalemia 06/12/2014  . Pulmonary hypertension ([place]) 06/12/2014  . UTI (urinary tract infection) 06/11/2014  . Atrial fibrillation with RVR ([place]) 06/11/2014  . Acute diastolic heart failure ([place]) 06/11/2014  . A-fib ([place])   . Acute respiratory failure ([place]) 06/10/2014  . Metabolic acidosis 31/67/4255  . Acute kidney injury ([place]) 06/08/2014  . Syncope and collapse 06/08/2014  . Normocytic anemia 06/08/2014  . Diabetes mellitus without complication ([place]) 25/89/4834  . Essential hypertension 06/08/2014  . Hyperlipidemia 06/08/2014  . Edema extremities 06/08/2014  . Acute on chronic diastolic heart failure ([place]) 06/08/2014  . Faintness   . Contusion of face    Ailene Ravel, OTR/L,CBIS  [phone] 12/27/2015, 12:47 PM  [facility] [address] [place], [place], [address] Phone: [phone]   Fax:  [phone]  Name: BREELLA VANOSTRAND MRN: [id number] Date of Birth: Dec 21, 1950

## 2015-12-28 DIAGNOSIS — N186 End stage renal disease: Secondary | ICD-10-CM | POA: Diagnosis not present

## 2015-12-28 DIAGNOSIS — Z992 Dependence on renal dialysis: Secondary | ICD-10-CM | POA: Diagnosis not present

## 2015-12-28 DIAGNOSIS — D631 Anemia in chronic kidney disease: Secondary | ICD-10-CM | POA: Diagnosis not present

## 2015-12-28 DIAGNOSIS — D509 Iron deficiency anemia, unspecified: Secondary | ICD-10-CM | POA: Diagnosis not present

## 2015-12-28 DIAGNOSIS — N2581 Secondary hyperparathyroidism of renal origin: Secondary | ICD-10-CM | POA: Diagnosis not present

## 2015-12-28 DIAGNOSIS — E162 Hypoglycemia, unspecified: Secondary | ICD-10-CM | POA: Diagnosis not present

## 2015-12-29 ENCOUNTER — Encounter (HOSPITAL_COMMUNITY): Payer: Self-pay

## 2015-12-29 ENCOUNTER — Ambulatory Visit (HOSPITAL_COMMUNITY): Payer: Medicare Other

## 2015-12-29 DIAGNOSIS — M25511 Pain in right shoulder: Secondary | ICD-10-CM

## 2015-12-29 DIAGNOSIS — M25611 Stiffness of right shoulder, not elsewhere classified: Secondary | ICD-10-CM

## 2015-12-29 DIAGNOSIS — R29898 Other symptoms and signs involving the musculoskeletal system: Secondary | ICD-10-CM | POA: Diagnosis not present

## 2015-12-29 NOTE — Therapy (Signed)
[place], [place], [address] Phone: [phone]   Fax:  [phone]  Occupational Therapy Treatment  Patient Details  Name: Brenda Lee MRN: [id number] Date of Birth: [birth year] Referring Provider: Sanjuana Kava, MD  Encounter Date: 12/29/2015      OT End of Session - 12/29/15 1050    Visit Number 3   Number of Visits 16   Date for OT Re-Evaluation 02/18/16   Authorization Type 1) medicare Part A 2) BCBS supplement   Authorization Time Period before 10th visit   Authorization - Visit Number 3   Authorization - Number of Visits 10   OT Start Time [id number]   OT Stop Time 1030   OT Time Calculation (min) 41 min   Activity Tolerance Patient tolerated treatment well   Behavior During Therapy [facility] for tasks assessed/performed      Past Medical History:  Diagnosis Date  . Acute kidney injury ([place]) 06/08/2014  . Acute on chronic diastolic heart failure ([place]) 06/08/2014  . Anemia   . Atrial fibrillation with RVR ([place]) 06/11/2014  . CHF (congestive heart failure) ([place])   . CKD (chronic kidney disease)   . Diabetes mellitus without complication ([place])   . Hypertension   . Pulmonary hypertension ([place]) 06/12/2014  . Renal insufficiency     Past Surgical History:  Procedure Laterality Date  . AV FISTULA PLACEMENT Left 09/07/2014   Procedure: ARTERIOVENOUS (AV) FISTULA CREATION-LEFT;  Surgeon: Elam Dutch, MD;  Location: [place];  Service: Vascular;  Laterality: Left;  . Jugular Cath Right Feb. 2, 2016   HD    There were no vitals filed for this visit.      Subjective Assessment - 12/29/15 0949    Subjective  S: fed myself yesterday and I started writing a check.   Currently in Pain? Yes   Pain Score 3    Pain Location Arm   Pain Orientation Right   Pain Descriptors / Indicators Sore   Pain Type Acute pain            [facility] OT Assessment - 12/29/15 1018      Assessment   Diagnosis closed fracture right  proximal humerus     Precautions   Precautions None   Required Braces or Orthoses Sling                  OT Treatments/Exercises (OP) - 12/29/15 1018      Exercises   Exercises Shoulder     Shoulder Exercises: Supine   Protraction PROM;10 reps   Horizontal ABduction PROM;10 reps   External Rotation PROM;10 reps   Internal Rotation PROM;10 reps   Flexion PROM;10 reps   ABduction PROM;10 reps     Shoulder Exercises: Seated   Elevation AROM;10 reps   Extension AROM;10 reps   Row AROM;10 reps     Shoulder Exercises: Therapy Ball   Flexion 10 reps   ABduction 10 reps     Shoulder Exercises: ROM/Strengthening   Thumb Tacks low level 1'  used chair border to prevent thumbs from slipping.   Anterior Glide 3x10"   Prot/Ret//Elev/Dep 1'  Completed with arms down at side.      Manual Therapy   Manual Therapy Myofascial release;Muscle Energy Technique   Manual therapy comments manual therapy completed prior to exercises.    Myofascial Release Myofascial release and manual stretching completed to right upper arm, trapezius, and scapularis region to decrease fascial restrictions and  increase joint mobility in a pain free zone.    Muscle Energy Technique Muscle energy technique completed to  right anterior deltoid to relax                  OT Short Term Goals - 12/27/15 1241      OT SHORT TERM GOAL #1   Title Patient will be educated and independent with HEP to increase functional use of RUE during daily tasks.    Time 4   Period Weeks   Status On-going     OT SHORT TERM GOAL #2   Title Patient will increase P/ROM of shoulder and elbow  to [facility] to increase ability to get shirts on and off with less difficulty.    Time 4   Period Weeks   Status On-going     OT SHORT TERM GOAL #3   Title Patient will increase strength to 3+/5 to increase ability to complete tasks below shoulder.    Time 4   Period Weeks   Status On-going     OT SHORT TERM GOAL #4    Title Patient will decrease fascial restrictions to min amount to increase functional mobility of RUE needed for reaching tasks.    Time 4   Period Weeks   Status On-going     OT SHORT TERM GOAL #5   Title Patient will decrease pain level to 2/10 or less when using RUE during household activities.    Time 4   Period Weeks   Status On-going           OT Long Term Goals - 12/27/15 1242      OT LONG TERM GOAL #1   Title Patient will return to highest level of independence with all daily and leisure tasks using RUE.    Time 8   Period Weeks   Status On-going     OT LONG TERM GOAL #2   Title Patient will increase A/ROM to [facility] in shoulder and elbow to increase ability to brush hair and reach into overhead cabinets.    Time 8   Period Weeks   Status On-going     OT LONG TERM GOAL #3   Title Patient will increase shoulder strength to 4+/5 to increase ability to complete normal household chores with RUE.   Time 8   Period Weeks   Status On-going     OT LONG TERM GOAL #4   Title Patient will decrease pain level to 1/10 when using RUE during daily tasks.   Time 8   Period Weeks   Status On-going     OT LONG TERM GOAL #5   Title Patient will decrease fascial restrictions to min amount to increase functional mobility to complete reaching tasks.   Time 8   Period Weeks   Status On-going               Plan - 12/29/15 1050    Clinical Impression Statement A: Completed isometric exercises and muscle energy technique to increase passive ROM. Patient had good results with muscle energy technique. Continues to have difficulty relaxing during passive stretching. Thumb tacks and pro/ret/elev/dep were completed with modifications for proper form/technique. Education provided regarding posture and scapular strength. Pt verbalized understanding.    Plan P: Continue working on increasing P/ROM and progress to AA/ROM as able.       Patient will benefit from skilled therapeutic  intervention in order to improve the following deficits and impairments:  Decreased  strength, Pain, Impaired UE functional use, Increased edema, Decreased range of motion, Increased fascial restricitons  Visit Diagnosis: Pain in right shoulder  Stiffness of right shoulder, not elsewhere classified  Other symptoms and signs involving the musculoskeletal system    Problem List Patient Active Problem List   Diagnosis Date Noted  . Hypokalemia 06/12/2014  . Pulmonary hypertension ([place]) 06/12/2014  . UTI (urinary tract infection) 06/11/2014  . Atrial fibrillation with RVR ([place]) 06/11/2014  . Acute diastolic heart failure ([place]) 06/11/2014  . A-fib ([place])   . Acute respiratory failure ([place]) 06/10/2014  . Metabolic acidosis 27/12/8673  . Acute kidney injury ([place]) 06/08/2014  . Syncope and collapse 06/08/2014  . Normocytic anemia 06/08/2014  . Diabetes mellitus without complication ([place]) 44/92/0100  . Essential hypertension 06/08/2014  . Hyperlipidemia 06/08/2014  . Edema extremities 06/08/2014  . Acute on chronic diastolic heart failure ([place]) 06/08/2014  . Faintness   . Contusion of face    Ailene Ravel, OTR/L,CBIS  [phone]  12/29/2015, 10:54 AM  [facility] [address] [place], [place], [address] Phone: [phone]   Fax:  [phone]  Name: Brenda Lee MRN: [id number] Date of Birth: [birth year]

## 2015-12-30 DIAGNOSIS — E162 Hypoglycemia, unspecified: Secondary | ICD-10-CM | POA: Diagnosis not present

## 2015-12-30 DIAGNOSIS — Z992 Dependence on renal dialysis: Secondary | ICD-10-CM | POA: Diagnosis not present

## 2015-12-30 DIAGNOSIS — D631 Anemia in chronic kidney disease: Secondary | ICD-10-CM | POA: Diagnosis not present

## 2015-12-30 DIAGNOSIS — D509 Iron deficiency anemia, unspecified: Secondary | ICD-10-CM | POA: Diagnosis not present

## 2015-12-30 DIAGNOSIS — N2581 Secondary hyperparathyroidism of renal origin: Secondary | ICD-10-CM | POA: Diagnosis not present

## 2015-12-30 DIAGNOSIS — N186 End stage renal disease: Secondary | ICD-10-CM | POA: Diagnosis not present

## 2016-01-01 DIAGNOSIS — Z992 Dependence on renal dialysis: Secondary | ICD-10-CM | POA: Diagnosis not present

## 2016-01-01 DIAGNOSIS — N2581 Secondary hyperparathyroidism of renal origin: Secondary | ICD-10-CM | POA: Diagnosis not present

## 2016-01-01 DIAGNOSIS — N186 End stage renal disease: Secondary | ICD-10-CM | POA: Diagnosis not present

## 2016-01-01 DIAGNOSIS — D631 Anemia in chronic kidney disease: Secondary | ICD-10-CM | POA: Diagnosis not present

## 2016-01-01 DIAGNOSIS — D509 Iron deficiency anemia, unspecified: Secondary | ICD-10-CM | POA: Diagnosis not present

## 2016-01-01 DIAGNOSIS — E162 Hypoglycemia, unspecified: Secondary | ICD-10-CM | POA: Diagnosis not present

## 2016-01-03 ENCOUNTER — Ambulatory Visit (HOSPITAL_COMMUNITY): Payer: Medicare Other

## 2016-01-03 ENCOUNTER — Encounter (HOSPITAL_COMMUNITY): Payer: Self-pay

## 2016-01-03 DIAGNOSIS — M25511 Pain in right shoulder: Secondary | ICD-10-CM | POA: Diagnosis not present

## 2016-01-03 DIAGNOSIS — Z992 Dependence on renal dialysis: Secondary | ICD-10-CM | POA: Diagnosis not present

## 2016-01-03 DIAGNOSIS — M25611 Stiffness of right shoulder, not elsewhere classified: Secondary | ICD-10-CM | POA: Diagnosis not present

## 2016-01-03 DIAGNOSIS — N186 End stage renal disease: Secondary | ICD-10-CM | POA: Diagnosis not present

## 2016-01-03 DIAGNOSIS — R29898 Other symptoms and signs involving the musculoskeletal system: Secondary | ICD-10-CM

## 2016-01-03 NOTE — Therapy (Signed)
[place], [place], [address] Phone: [phone]   Fax:  [phone]  Occupational Therapy Treatment  Patient Details  Name: Brenda Lee MRN: [id number] Date of Birth: [birth year] Referring Provider: Sanjuana Kava, MD  Encounter Date: 01/03/2016      OT End of Session - 01/03/16 1205    Visit Number 4   Number of Visits 16   Date for OT Re-Evaluation 02/18/16   Authorization Type 1) medicare Part A 2) BCBS supplement   Authorization Time Period before 10th visit   Authorization - Visit Number 4   Authorization - Number of Visits 10   OT Start Time 1110   OT Stop Time 1155   OT Time Calculation (min) 45 min   Activity Tolerance Patient tolerated treatment well   Behavior During Therapy [facility] for tasks assessed/performed      Past Medical History:  Diagnosis Date  . Acute kidney injury ([place]) 06/08/2014  . Acute on chronic diastolic heart failure ([place]) 06/08/2014  . Anemia   . Atrial fibrillation with RVR ([place]) 06/11/2014  . CHF (congestive heart failure) ([place])   . CKD (chronic kidney disease)   . Diabetes mellitus without complication ([place])   . Hypertension   . Pulmonary hypertension ([place]) 06/12/2014  . Renal insufficiency     Past Surgical History:  Procedure Laterality Date  . AV FISTULA PLACEMENT Left 09/07/2014   Procedure: ARTERIOVENOUS (AV) FISTULA CREATION-LEFT;  Surgeon: Elam Dutch, MD;  Location: [place];  Service: Vascular;  Laterality: Left;  . Jugular Cath Right Feb. 2, 2016   HD    There were no vitals filed for this visit.      Subjective Assessment - 01/03/16 1127    Subjective  S: I cleaned out a building yesterday.   Currently in Pain? Yes   Pain Score 1    Pain Location Arm   Pain Orientation Right   Pain Descriptors / Indicators Sore   Pain Type Acute pain   Pain Radiating Towards N/A   Pain Onset 1 to 4 weeks ago   Pain Frequency Occasional   Aggravating Factors  movement  and use   Pain Relieving Factors rest and pain medication   Effect of Pain on Daily Activities Limited use of RUE during daily tasks   Multiple Pain Sites No            [facility] OT Assessment - 01/03/16 1129      Assessment   Diagnosis closed fracture right proximal humerus     Precautions   Precautions None     AROM   Overall AROM  Unable to assess;Due to pain   AROM Assessment Site Shoulder   Right/Left Shoulder Right     PROM   Overall PROM Comments Assessed supine. IR/er adducted   PROM Assessment Site Shoulder;Elbow   Right/Left Shoulder Right   Right Shoulder Flexion 130 Degrees  previous: 105   Right Shoulder ABduction 180 Degrees  previous: 88   Right Shoulder Internal Rotation 90 Degrees  previous: same   Right Shoulder External Rotation 22 Degrees  previous; 15   Right/Left Elbow Right   Right Elbow Extension 0  previous: -12     Strength   Strength Assessment Site Shoulder   Right/Left Shoulder Right   Right Shoulder Flexion 3-/5   Right Shoulder ABduction 3-/5   Right Shoulder Internal Rotation 3-/5   Right Shoulder External Rotation 3-/5  OT Treatments/Exercises (OP) - 01/03/16 1129      Exercises   Exercises Shoulder     Shoulder Exercises: Supine   Protraction PROM;AAROM;10 reps   Horizontal ABduction PROM;AAROM;10 reps   External Rotation PROM;AAROM;10 reps   Internal Rotation PROM;AAROM;10 reps   Flexion PROM;AAROM  8X   ABduction PROM;AAROM;10 reps;Limitations   ABduction Limitations Physical assist to off load arm.     Shoulder Exercises: Seated   Elevation AROM;10 reps   Extension AROM;10 reps   Row AROM;10 reps   Protraction AAROM;5 reps   External Rotation AAROM;5 reps   Internal Rotation AAROM;5 reps   Flexion AAROM;5 reps   Abduction AAROM;5 reps     Shoulder Exercises: Pulleys   Flexion 1 minute   ABduction 1 minute   ABduction Limitations one finger assist used to keep proper form      Shoulder Exercises: ROM/Strengthening   Wall Wash 1'     Manual Therapy   Manual Therapy Myofascial release;Muscle Energy Technique   Manual therapy comments manual therapy completed prior to exercises.    Myofascial Release Myofascial release and manual stretching completed to right upper arm, trapezius, and scapularis region to decrease fascial restrictions and increase joint mobility in a pain free zone.    Muscle Energy Technique Muscle energy technique completed to  right anterior deltoid to relax                  OT Short Term Goals - 12/27/15 1241      OT SHORT TERM GOAL #1   Title Patient will be educated and independent with HEP to increase functional use of RUE during daily tasks.    Time 4   Period Weeks   Status On-going     OT SHORT TERM GOAL #2   Title Patient will increase P/ROM of shoulder and elbow  to [facility] to increase ability to get shirts on and off with less difficulty.    Time 4   Period Weeks   Status On-going     OT SHORT TERM GOAL #3   Title Patient will increase strength to 3+/5 to increase ability to complete tasks below shoulder.    Time 4   Period Weeks   Status On-going     OT SHORT TERM GOAL #4   Title Patient will decrease fascial restrictions to min amount to increase functional mobility of RUE needed for reaching tasks.    Time 4   Period Weeks   Status On-going     OT SHORT TERM GOAL #5   Title Patient will decrease pain level to 2/10 or less when using RUE during household activities.    Time 4   Period Weeks   Status On-going           OT Long Term Goals - 12/27/15 1242      OT LONG TERM GOAL #1   Title Patient will return to highest level of independence with all daily and leisure tasks using RUE.    Time 8   Period Weeks   Status On-going     OT LONG TERM GOAL #2   Title Patient will increase A/ROM to [facility] in shoulder and elbow to increase ability to brush hair and reach into overhead cabinets.    Time 8   Period  Weeks   Status On-going     OT LONG TERM GOAL #3   Title Patient will increase shoulder strength to 4+/5 to increase ability to complete normal  household chores with RUE.   Time 8   Period Weeks   Status On-going     OT LONG TERM GOAL #4   Title Patient will decrease pain level to 1/10 when using RUE during daily tasks.   Time 8   Period Weeks   Status On-going     OT LONG TERM GOAL #5   Title Patient will decrease fascial restrictions to min amount to increase functional mobility to complete reaching tasks.   Time 8   Period Weeks   Status On-going               Plan - 01/03/16 1205    Clinical Impression Statement A: Measurements taken for MD appointment. patient increased with all P/ROM measurements. Was able to complete AA/ROM supine and seated. Added Wall wash and pulleys this session. patient making great progress towards therapy goals. Pt arrived without sling on this session and states that she hasn't been wearing it much. VC are needed for form and technique during exercises.    Plan P: Follow up on MD appointment. Add AA/ROM to HEP. Continue with AA/ROM exercises.       Patient will benefit from skilled therapeutic intervention in order to improve the following deficits and impairments:  Decreased strength, Pain, Impaired UE functional use, Increased edema, Decreased range of motion, Increased fascial restricitons  Visit Diagnosis: Pain in right shoulder  Stiffness of right shoulder, not elsewhere classified  Other symptoms and signs involving the musculoskeletal system    Problem List Patient Active Problem List   Diagnosis Date Noted  . Hypokalemia 06/12/2014  . Pulmonary hypertension ([place]) 06/12/2014  . UTI (urinary tract infection) 06/11/2014  . Atrial fibrillation with RVR ([place]) 06/11/2014  . Acute diastolic heart failure ([place]) 06/11/2014  . A-fib ([place])   . Acute respiratory failure ([place]) 06/10/2014  . Metabolic acidosis 99991111  . Acute  kidney injury ([place]) 06/08/2014  . Syncope and collapse 06/08/2014  . Normocytic anemia 06/08/2014  . Diabetes mellitus without complication ([place]) 99991111  . Essential hypertension 06/08/2014  . Hyperlipidemia 06/08/2014  . Edema extremities 06/08/2014  . Acute on chronic diastolic heart failure ([place]) 06/08/2014  . Faintness   . Contusion of face     Ailene Ravel, OTR/L,CBIS  [phone]  01/03/2016, 12:09 PM  [place] [address] [place], [place], [address] Phone: [phone]   Fax:  [phone]  Name: Brenda Lee MRN: [id number] Date of Birth: [birth year]

## 2016-01-04 DIAGNOSIS — E11649 Type 2 diabetes mellitus with hypoglycemia without coma: Secondary | ICD-10-CM | POA: Diagnosis not present

## 2016-01-04 DIAGNOSIS — N2581 Secondary hyperparathyroidism of renal origin: Secondary | ICD-10-CM | POA: Diagnosis not present

## 2016-01-04 DIAGNOSIS — D631 Anemia in chronic kidney disease: Secondary | ICD-10-CM | POA: Diagnosis not present

## 2016-01-04 DIAGNOSIS — Z992 Dependence on renal dialysis: Secondary | ICD-10-CM | POA: Diagnosis not present

## 2016-01-04 DIAGNOSIS — D509 Iron deficiency anemia, unspecified: Secondary | ICD-10-CM | POA: Diagnosis not present

## 2016-01-04 DIAGNOSIS — N186 End stage renal disease: Secondary | ICD-10-CM | POA: Diagnosis not present

## 2016-01-04 DIAGNOSIS — Z794 Long term (current) use of insulin: Secondary | ICD-10-CM | POA: Diagnosis not present

## 2016-01-04 DIAGNOSIS — E162 Hypoglycemia, unspecified: Secondary | ICD-10-CM | POA: Diagnosis not present

## 2016-01-05 ENCOUNTER — Ambulatory Visit: Payer: Medicare Other | Admitting: Orthopaedic Surgery

## 2016-01-05 ENCOUNTER — Ambulatory Visit (HOSPITAL_COMMUNITY)
Admission: RE | Admit: 2016-01-05 | Discharge: 2016-01-05 | Disposition: A | Discharge status: Home / Self Care | Payer: Medicare Other | Source: Ambulatory Visit | Attending: Orthopaedic Surgery | Admitting: Orthopaedic Surgery

## 2016-01-05 ENCOUNTER — Encounter: Payer: Self-pay | Admitting: Orthopaedic Surgery

## 2016-01-05 ENCOUNTER — Ambulatory Visit (HOSPITAL_COMMUNITY): Payer: Medicare Other | Attending: Orthopaedic Surgery

## 2016-01-05 VITALS — BP 213/93 | HR 79 | Temp 97.7°F | Ht 63.75 in | Wt 164.4 lb

## 2016-01-05 DIAGNOSIS — S42201D Unspecified fracture of upper end of right humerus, subsequent encounter for fracture with routine healing: Secondary | ICD-10-CM

## 2016-01-05 DIAGNOSIS — R29898 Other symptoms and signs involving the musculoskeletal system: Secondary | ICD-10-CM | POA: Diagnosis not present

## 2016-01-05 DIAGNOSIS — M25611 Stiffness of right shoulder, not elsewhere classified: Secondary | ICD-10-CM

## 2016-01-05 DIAGNOSIS — X58XXXD Exposure to other specified factors, subsequent encounter: Secondary | ICD-10-CM | POA: Diagnosis not present

## 2016-01-05 DIAGNOSIS — M25511 Pain in right shoulder: Secondary | ICD-10-CM | POA: Insufficient documentation

## 2016-01-05 DIAGNOSIS — S42214A Unspecified nondisplaced fracture of surgical neck of right humerus, initial encounter for closed fracture: Secondary | ICD-10-CM | POA: Diagnosis not present

## 2016-01-05 MED ORDER — HYDROCODONE-ACETAMINOPHEN 5-325 MG PO TABS: 1.0000 | ORAL_TABLET | ORAL | 0 refills | Status: AC | PRN

## 2016-01-05 NOTE — Patient Instructions (Signed)
Continue therapy. Precautions discussed.

## 2016-01-05 NOTE — Therapy (Signed)
[place], [place], [address] Phone: [phone]   Fax:  [phone]  Occupational Therapy Treatment  Patient Details  Name: Brenda Lee MRN: [id number] Date of Birth: May 08, 1951 Referring Provider: Sanjuana Kava, MD  Encounter Date: 01/05/2016      OT End of Session - 01/05/16 1309    Visit Number 5   Number of Visits 16   Date for OT Re-Evaluation 02/18/16   Authorization Type 1) medicare Part A 2) BCBS supplement   Authorization Time Period before 10th visit   Authorization - Visit Number 5   Authorization - Number of Visits 10   OT Start Time 1115   OT Stop Time 1200   OT Time Calculation (min) 45 min   Activity Tolerance Patient tolerated treatment well   Behavior During Therapy [facility] for tasks assessed/performed      Past Medical History:  Diagnosis Date  . Acute kidney injury ([place]) 06/08/2014  . Acute on chronic diastolic heart failure ([place]) 06/08/2014  . Anemia   . Atrial fibrillation with RVR ([place]) 06/11/2014  . CHF (congestive heart failure) ([place])   . CKD (chronic kidney disease)   . Diabetes mellitus without complication ([place])   . Hypertension   . Pulmonary hypertension ([place]) 06/12/2014  . Renal insufficiency     Past Surgical History:  Procedure Laterality Date  . AV FISTULA PLACEMENT Left 09/07/2014   Procedure: ARTERIOVENOUS (AV) FISTULA CREATION-LEFT;  Surgeon: Elam Dutch, MD;  Location: [place];  Service: Vascular;  Laterality: Left;  . Jugular Cath Right Feb. 2, 2016   HD    There were no vitals filed for this visit.      Subjective Assessment - 01/05/16 1307    Subjective  S: I saw Dr. Luna Glasgow today and he said everything is looking good.   Currently in Pain? Yes   Pain Score 1    Pain Location Arm   Pain Orientation Right   Pain Descriptors / Indicators Sore   Pain Type Acute pain            [facility] OT Assessment - 01/05/16 1143      Assessment   Diagnosis closed  fracture right proximal humerus     Precautions   Precautions None                  OT Treatments/Exercises (OP) - 01/05/16 1143      Exercises   Exercises Shoulder     Shoulder Exercises: Supine   Protraction PROM;AAROM;10 reps   Horizontal ABduction PROM;AAROM;10 reps   External Rotation PROM;AAROM;10 reps   Internal Rotation PROM;AAROM;10 reps   Flexion PROM;AAROM;10 reps   ABduction PROM;AAROM;10 reps;Limitations   ABduction Limitations Physical assist to off load arm.     Shoulder Exercises: Seated   Elevation AROM;10 reps   Extension AROM;10 reps   Row AROM;10 reps   Protraction AAROM;10 reps   Horizontal ABduction AAROM;10 reps   External Rotation AAROM;10 reps   Internal Rotation AAROM;10 reps   Flexion AAROM;10 reps   Abduction AAROM;10 reps     Shoulder Exercises: ROM/Strengthening   Wall Wash 1'     Manual Therapy   Manual Therapy Myofascial release;Muscle Energy Technique   Manual therapy comments manual therapy completed prior to exercises.    Myofascial Release Myofascial release and manual stretching completed to right upper arm, trapezius, and scapularis region to decrease fascial restrictions and increase joint mobility in a pain free  zone.    Muscle Energy Technique Muscle energy technique completed to  right anterior deltoid to relax                  OT Short Term Goals - 12/27/15 1241      OT SHORT TERM GOAL #1   Title Patient will be educated and independent with HEP to increase functional use of RUE during daily tasks.    Time 4   Period Weeks   Status On-going     OT SHORT TERM GOAL #2   Title Patient will increase P/ROM of shoulder and elbow  to [facility] to increase ability to get shirts on and off with less difficulty.    Time 4   Period Weeks   Status On-going     OT SHORT TERM GOAL #3   Title Patient will increase strength to 3+/5 to increase ability to complete tasks below shoulder.    Time 4   Period Weeks    Status On-going     OT SHORT TERM GOAL #4   Title Patient will decrease fascial restrictions to min amount to increase functional mobility of RUE needed for reaching tasks.    Time 4   Period Weeks   Status On-going     OT SHORT TERM GOAL #5   Title Patient will decrease pain level to 2/10 or less when using RUE during household activities.    Time 4   Period Weeks   Status On-going           OT Long Term Goals - 12/27/15 1242      OT LONG TERM GOAL #1   Title Patient will return to highest level of independence with all daily and leisure tasks using RUE.    Time 8   Period Weeks   Status On-going     OT LONG TERM GOAL #2   Title Patient will increase A/ROM to [facility] in shoulder and elbow to increase ability to brush hair and reach into overhead cabinets.    Time 8   Period Weeks   Status On-going     OT LONG TERM GOAL #3   Title Patient will increase shoulder strength to 4+/5 to increase ability to complete normal household chores with RUE.   Time 8   Period Weeks   Status On-going     OT LONG TERM GOAL #4   Title Patient will decrease pain level to 1/10 when using RUE during daily tasks.   Time 8   Period Weeks   Status On-going     OT LONG TERM GOAL #5   Title Patient will decrease fascial restrictions to min amount to increase functional mobility to complete reaching tasks.   Time 8   Period Weeks   Status On-going               Plan - 01/05/16 1309    Clinical Impression Statement A: Dr. Luna Glasgow states to that patient is to continue OT and patient is to follow up with him in 3 weeks. Patient completed additional AA/ROM seated exercises although conitnues to require VC for form and technique.   Plan P: Add AA/ROM to HEP when appropriate. Continue with pulleys and thumb tacks. Add PVC pipe slide.      Patient will benefit from skilled therapeutic intervention in order to improve the following deficits and impairments:  Decreased strength, Pain,  Impaired UE functional use, Increased edema, Decreased range of motion, Increased fascial restricitons  Visit Diagnosis: Pain in right shoulder  Stiffness of right shoulder, not elsewhere classified  Other symptoms and signs involving the musculoskeletal system    Problem List Patient Active Problem List   Diagnosis Date Noted  . Hypokalemia 06/12/2014  . Pulmonary hypertension ([place]) 06/12/2014  . UTI (urinary tract infection) 06/11/2014  . Atrial fibrillation with RVR ([place]) 06/11/2014  . Acute diastolic heart failure ([place]) 06/11/2014  . A-fib ([place])   . Acute respiratory failure ([place]) 06/10/2014  . Metabolic acidosis 99991111  . Acute kidney injury ([place]) 06/08/2014  . Syncope and collapse 06/08/2014  . Normocytic anemia 06/08/2014  . Diabetes mellitus without complication ([place]) 99991111  . Essential hypertension 06/08/2014  . Hyperlipidemia 06/08/2014  . Edema extremities 06/08/2014  . Acute on chronic diastolic heart failure ([place]) 06/08/2014  . Faintness   . Contusion of face    Ailene Ravel, OTR/L,CBIS  [phone]  01/05/2016, 1:13 PM  [facility] [address] [place], [place], [address] Phone: [phone]   Fax:  [phone]  Name: Brenda Lee MRN: [id number] Date of Birth: 1951/04/27

## 2016-01-05 NOTE — Progress Notes (Signed)
CC:  My shoulder is sore but better  She has been going to PT/OT and doing well.  She is gradually getting better with ROM of the right shoulder.  NV intact.  See therapy notes for motion.  Encounter Diagnosis  Name Primary?  . Closed fracture of right proximal humerus, with routine healing, subsequent encounter Yes    Continue OT  Return in three weeks  No x-rays on return.  Call if any problem.  Precautions discussed.  Electronically Signed Sanjuana Kava, MD 8/2/201710:33 AM

## 2016-01-06 DIAGNOSIS — N186 End stage renal disease: Secondary | ICD-10-CM | POA: Diagnosis not present

## 2016-01-06 DIAGNOSIS — D631 Anemia in chronic kidney disease: Secondary | ICD-10-CM | POA: Diagnosis not present

## 2016-01-06 DIAGNOSIS — N2581 Secondary hyperparathyroidism of renal origin: Secondary | ICD-10-CM | POA: Diagnosis not present

## 2016-01-06 DIAGNOSIS — E162 Hypoglycemia, unspecified: Secondary | ICD-10-CM | POA: Diagnosis not present

## 2016-01-06 DIAGNOSIS — D509 Iron deficiency anemia, unspecified: Secondary | ICD-10-CM | POA: Diagnosis not present

## 2016-01-06 DIAGNOSIS — Z992 Dependence on renal dialysis: Secondary | ICD-10-CM | POA: Diagnosis not present

## 2016-01-08 DIAGNOSIS — Z992 Dependence on renal dialysis: Secondary | ICD-10-CM | POA: Diagnosis not present

## 2016-01-08 DIAGNOSIS — N2581 Secondary hyperparathyroidism of renal origin: Secondary | ICD-10-CM | POA: Diagnosis not present

## 2016-01-08 DIAGNOSIS — D631 Anemia in chronic kidney disease: Secondary | ICD-10-CM | POA: Diagnosis not present

## 2016-01-08 DIAGNOSIS — D509 Iron deficiency anemia, unspecified: Secondary | ICD-10-CM | POA: Diagnosis not present

## 2016-01-08 DIAGNOSIS — N186 End stage renal disease: Secondary | ICD-10-CM | POA: Diagnosis not present

## 2016-01-08 DIAGNOSIS — E162 Hypoglycemia, unspecified: Secondary | ICD-10-CM | POA: Diagnosis not present

## 2016-01-10 ENCOUNTER — Encounter (HOSPITAL_COMMUNITY): Payer: Self-pay

## 2016-01-10 ENCOUNTER — Ambulatory Visit (HOSPITAL_COMMUNITY): Payer: Medicare Other

## 2016-01-10 DIAGNOSIS — R29898 Other symptoms and signs involving the musculoskeletal system: Secondary | ICD-10-CM

## 2016-01-10 DIAGNOSIS — M25511 Pain in right shoulder: Secondary | ICD-10-CM | POA: Diagnosis not present

## 2016-01-10 DIAGNOSIS — M25611 Stiffness of right shoulder, not elsewhere classified: Secondary | ICD-10-CM

## 2016-01-10 NOTE — Patient Instructions (Signed)

## 2016-01-10 NOTE — Therapy (Signed)
[place], [place], [address] Phone: [phone]   Fax:  [phone]  Occupational Therapy Treatment  Patient Details  Name: Brenda Lee MRN: [id number] Date of Birth: May 09, 1951 Referring Provider: Sanjuana Kava, MD  Encounter Date: 01/10/2016      OT End of Session - 01/10/16 1112    Visit Number 6   Number of Visits 16   Date for OT Re-Evaluation 02/18/16   Authorization Type 1) medicare Part A 2) BCBS supplement   Authorization Time Period before 10th visit   Authorization - Visit Number 6   Authorization - Number of Visits 10   OT Start Time 1048   OT Stop Time 1138   OT Time Calculation (min) 50 min   Activity Tolerance Patient tolerated treatment well   Behavior During Therapy [facility] for tasks assessed/performed      Past Medical History:  Diagnosis Date  . Acute kidney injury ([place]) 06/08/2014  . Acute on chronic diastolic heart failure ([place]) 06/08/2014  . Anemia   . Atrial fibrillation with RVR ([place]) 06/11/2014  . CHF (congestive heart failure) ([place])   . CKD (chronic kidney disease)   . Diabetes mellitus without complication ([place])   . Hypertension   . Pulmonary hypertension ([place]) 06/12/2014  . Renal insufficiency     Past Surgical History:  Procedure Laterality Date  . AV FISTULA PLACEMENT Left 09/07/2014   Procedure: ARTERIOVENOUS (AV) FISTULA CREATION-LEFT;  Surgeon: Elam Dutch, MD;  Location: [facility];  Service: Vascular;  Laterality: Left;  . Jugular Cath Right Feb. 2, 2016   HD    There were no vitals filed for this visit.      Subjective Assessment - 01/10/16 1112    Subjective  S: I can reach the top of my head now and almost hug my grandkids with this arm.   Currently in Pain? Yes   Pain Score 1    Pain Location Arm   Pain Orientation Right   Pain Descriptors / Indicators Sore   Pain Type Acute pain            [facility] OT Assessment - 01/10/16 1107      Assessment   Diagnosis closed fracture right proximal humerus     Precautions   Precautions None                  OT Treatments/Exercises (OP) - 01/10/16 1106      Exercises   Exercises Shoulder     Shoulder Exercises: Supine   Protraction PROM;10 reps;AAROM;12 reps   Horizontal ABduction PROM;10 reps;AAROM;12 reps   External Rotation PROM;10 reps;AAROM;12 reps   Internal Rotation PROM;10 reps;AAROM;12 reps   Flexion PROM;10 reps;AAROM;12 reps   ABduction PROM;10 reps;AAROM;12 reps     Shoulder Exercises: Seated   Protraction AAROM;10 reps   Horizontal ABduction AAROM;10 reps   External Rotation AAROM;10 reps   Internal Rotation AAROM;10 reps   Flexion AAROM;10 reps   Abduction AAROM;10 reps     Shoulder Exercises: Pulleys   Flexion 1 minute   ABduction 1 minute     Shoulder Exercises: ROM/Strengthening   Wall Wash 1'   Thumb Tacks 1'   Proximal Shoulder Strengthening, Supine 10X no rest breaks. Max verbal and visual cues   Other ROM/Strengthening Exercises PVC pipe slide in corner; 10X     Manual Therapy   Manual Therapy Myofascial release;Muscle Energy Technique   Manual therapy comments manual therapy completed prior  to exercises.    Myofascial Release Myofascial release and manual stretching completed to right upper arm, trapezius, and scapularis region to decrease fascial restrictions and increase joint mobility in a pain free zone.    Muscle Energy Technique Muscle energy technique completed to  right anterior deltoid to relax                  OT Short Term Goals - 12/27/15 1241      OT SHORT TERM GOAL #1   Title Patient will be educated and independent with HEP to increase functional use of RUE during daily tasks.    Time 4   Period Weeks   Status On-going     OT SHORT TERM GOAL #2   Title Patient will increase P/ROM of shoulder and elbow  to [facility] to increase ability to get shirts on and off with less difficulty.    Time 4   Period Weeks   Status  On-going     OT SHORT TERM GOAL #3   Title Patient will increase strength to 3+/5 to increase ability to complete tasks below shoulder.    Time 4   Period Weeks   Status On-going     OT SHORT TERM GOAL #4   Title Patient will decrease fascial restrictions to min amount to increase functional mobility of RUE needed for reaching tasks.    Time 4   Period Weeks   Status On-going     OT SHORT TERM GOAL #5   Title Patient will decrease pain level to 2/10 or less when using RUE during household activities.    Time 4   Period Weeks   Status On-going           OT Long Term Goals - 12/27/15 1242      OT LONG TERM GOAL #1   Title Patient will return to highest level of independence with all daily and leisure tasks using RUE.    Time 8   Period Weeks   Status On-going     OT LONG TERM GOAL #2   Title Patient will increase A/ROM to [facility] in shoulder and elbow to increase ability to brush hair and reach into overhead cabinets.    Time 8   Period Weeks   Status On-going     OT LONG TERM GOAL #3   Title Patient will increase shoulder strength to 4+/5 to increase ability to complete normal household chores with RUE.   Time 8   Period Weeks   Status On-going     OT LONG TERM GOAL #4   Title Patient will decrease pain level to 1/10 when using RUE during daily tasks.   Time 8   Period Weeks   Status On-going     OT LONG TERM GOAL #5   Title Patient will decrease fascial restrictions to min amount to increase functional mobility to complete reaching tasks.   Time 8   Period Weeks   Status On-going               Plan - 01/10/16 1401    Clinical Impression Statement A: Added AA/ROM to HEP and reviewed exercises. Able to complete thumb tacks at shoulder level with difficulty with shoulder stability. Added PVC slide to increase ROM. VC needed for form and technique.    Plan P: Continue to work on increasing passive ROM and increase shoulder stability. Add proximal shoulder  strengthening.       Patient will benefit from  skilled therapeutic intervention in order to improve the following deficits and impairments:  Decreased strength, Pain, Impaired UE functional use, Increased edema, Decreased range of motion, Increased fascial restricitons  Visit Diagnosis: Pain in right shoulder  Stiffness of right shoulder, not elsewhere classified  Other symptoms and signs involving the musculoskeletal system    Problem List Patient Active Problem List   Diagnosis Date Noted  . Hypokalemia 06/12/2014  . Pulmonary hypertension ([place]) 06/12/2014  . UTI (urinary tract infection) 06/11/2014  . Atrial fibrillation with RVR ([place]) 06/11/2014  . Acute diastolic heart failure ([place]) 06/11/2014  . A-fib ([place])   . Acute respiratory failure ([place]) 06/10/2014  . Metabolic acidosis 99991111  . Acute kidney injury ([place]) 06/08/2014  . Syncope and collapse 06/08/2014  . Normocytic anemia 06/08/2014  . Diabetes mellitus without complication ([place]) 99991111  . Essential hypertension 06/08/2014  . Hyperlipidemia 06/08/2014  . Edema extremities 06/08/2014  . Acute on chronic diastolic heart failure ([place]) 06/08/2014  . Faintness   . Contusion of face    Ailene Ravel, OTR/L,CBIS  [phone]  01/10/2016, 3:02 PM  [facility] [address] [place], [place], [address] Phone: [phone]   Fax:  [phone]  Name: RYLLIE PAONESSA MRN: [id number] Date of Birth: March 26, 1951

## 2016-01-11 DIAGNOSIS — E162 Hypoglycemia, unspecified: Secondary | ICD-10-CM | POA: Diagnosis not present

## 2016-01-11 DIAGNOSIS — Z992 Dependence on renal dialysis: Secondary | ICD-10-CM | POA: Diagnosis not present

## 2016-01-11 DIAGNOSIS — D509 Iron deficiency anemia, unspecified: Secondary | ICD-10-CM | POA: Diagnosis not present

## 2016-01-11 DIAGNOSIS — N186 End stage renal disease: Secondary | ICD-10-CM | POA: Diagnosis not present

## 2016-01-11 DIAGNOSIS — N2581 Secondary hyperparathyroidism of renal origin: Secondary | ICD-10-CM | POA: Diagnosis not present

## 2016-01-11 DIAGNOSIS — D631 Anemia in chronic kidney disease: Secondary | ICD-10-CM | POA: Diagnosis not present

## 2016-01-12 ENCOUNTER — Ambulatory Visit (HOSPITAL_COMMUNITY): Payer: Medicare Other

## 2016-01-12 ENCOUNTER — Encounter (HOSPITAL_COMMUNITY): Payer: Self-pay

## 2016-01-12 DIAGNOSIS — M25511 Pain in right shoulder: Secondary | ICD-10-CM | POA: Diagnosis not present

## 2016-01-12 DIAGNOSIS — R29898 Other symptoms and signs involving the musculoskeletal system: Secondary | ICD-10-CM

## 2016-01-12 DIAGNOSIS — M25611 Stiffness of right shoulder, not elsewhere classified: Secondary | ICD-10-CM

## 2016-01-12 NOTE — Therapy (Signed)
[place], [place], [address] Phone: [phone]   Fax:  [phone]  Occupational Therapy Treatment  Patient Details  Name: Brenda Lee MRN: [id number] Date of Birth: 1950/06/25 Referring Provider: Sanjuana Kava, MD  Encounter Date: 01/12/2016      OT End of Session - 01/12/16 1106    Visit Number 7   Number of Visits 16   Date for OT Re-Evaluation 02/18/16   Authorization Type 1) medicare Part A 2) BCBS supplement   Authorization Time Period before 10th visit   Authorization - Visit Number 7   Authorization - Number of Visits 10   OT Start Time 0950   OT Stop Time 1030   OT Time Calculation (min) 40 min   Activity Tolerance Patient tolerated treatment well   Behavior During Therapy [facility] for tasks assessed/performed      Past Medical History:  Diagnosis Date  . Acute kidney injury ([place]) 06/08/2014  . Acute on chronic diastolic heart failure ([place]) 06/08/2014  . Anemia   . Atrial fibrillation with RVR ([place]) 06/11/2014  . CHF (congestive heart failure) ([place])   . CKD (chronic kidney disease)   . Diabetes mellitus without complication ([place])   . Hypertension   . Pulmonary hypertension ([place]) 06/12/2014  . Renal insufficiency     Past Surgical History:  Procedure Laterality Date  . AV FISTULA PLACEMENT Left 09/07/2014   Procedure: ARTERIOVENOUS (AV) FISTULA CREATION-LEFT;  Surgeon: Elam Dutch, MD;  Location: [place];  Service: Vascular;  Laterality: Left;  . Jugular Cath Right Feb. 2, 2016   HD    There were no vitals filed for this visit.      Subjective Assessment - 01/12/16 1104    Subjective  S: I do a lot of washing windows at home because my grandkids get their fingerprints on them.    Currently in Pain? Yes   Pain Score 1    Pain Location Arm   Pain Orientation Right   Pain Descriptors / Indicators Sore   Pain Type Acute pain                      OT Treatments/Exercises (OP)  - 01/12/16 1005      Exercises   Exercises Shoulder     Shoulder Exercises: Supine   Protraction PROM;10 reps;AAROM;12 reps   Horizontal ABduction PROM;10 reps;AAROM;12 reps   External Rotation PROM;10 reps;AAROM;12 reps   Internal Rotation PROM;10 reps;AAROM;12 reps   Flexion PROM;10 reps;AAROM;12 reps   ABduction PROM;10 reps;AAROM;12 reps     Shoulder Exercises: Seated   Protraction AAROM;10 reps   Horizontal ABduction AAROM;10 reps   External Rotation AAROM;10 reps   Internal Rotation AAROM;10 reps   Flexion AAROM;10 reps   Abduction AAROM;10 reps     Shoulder Exercises: ROM/Strengthening   Wall Wash 1'   Proximal Shoulder Strengthening, Seated 10X no rest breaks     Manual Therapy   Manual Therapy Myofascial release;Muscle Energy Technique   Manual therapy comments manual therapy completed prior to exercises.    Myofascial Release Myofascial release and manual stretching completed to right upper arm, trapezius, and scapularis region to decrease fascial restrictions and increase joint mobility in a pain free zone.    Muscle Energy Technique Muscle energy technique completed to  right anterior deltoid to relax                  OT Short Term Goals -  12/27/15 1241      OT SHORT TERM GOAL #1   Title Patient will be educated and independent with HEP to increase functional use of RUE during daily tasks.    Time 4   Period Weeks   Status On-going     OT SHORT TERM GOAL #2   Title Patient will increase P/ROM of shoulder and elbow  to [facility] to increase ability to get shirts on and off with less difficulty.    Time 4   Period Weeks   Status On-going     OT SHORT TERM GOAL #3   Title Patient will increase strength to 3+/5 to increase ability to complete tasks below shoulder.    Time 4   Period Weeks   Status On-going     OT SHORT TERM GOAL #4   Title Patient will decrease fascial restrictions to min amount to increase functional mobility of RUE needed for  reaching tasks.    Time 4   Period Weeks   Status On-going     OT SHORT TERM GOAL #5   Title Patient will decrease pain level to 2/10 or less when using RUE during household activities.    Time 4   Period Weeks   Status On-going           OT Long Term Goals - 12/27/15 1242      OT LONG TERM GOAL #1   Title Patient will return to highest level of independence with all daily and leisure tasks using RUE.    Time 8   Period Weeks   Status On-going     OT LONG TERM GOAL #2   Title Patient will increase A/ROM to [facility] in shoulder and elbow to increase ability to brush hair and reach into overhead cabinets.    Time 8   Period Weeks   Status On-going     OT LONG TERM GOAL #3   Title Patient will increase shoulder strength to 4+/5 to increase ability to complete normal household chores with RUE.   Time 8   Period Weeks   Status On-going     OT LONG TERM GOAL #4   Title Patient will decrease pain level to 1/10 when using RUE during daily tasks.   Time 8   Period Weeks   Status On-going     OT LONG TERM GOAL #5   Title Patient will decrease fascial restrictions to min amount to increase functional mobility to complete reaching tasks.   Time 8   Period Weeks   Status On-going               Plan - 01/12/16 1106    Clinical Impression Statement P: Added proximal shoulder strengthening seated with VC to increase speed. patient was unable to complete activity with increased speed at this time.    Plan P: Continue to increase Passive ROM and attempt A/ROM supine when able.       Patient will benefit from skilled therapeutic intervention in order to improve the following deficits and impairments:  Decreased strength, Pain, Impaired UE functional use, Increased edema, Decreased range of motion, Increased fascial restricitons  Visit Diagnosis: Pain in right shoulder  Stiffness of right shoulder, not elsewhere classified  Other symptoms and signs involving the  musculoskeletal system    Problem List Patient Active Problem List   Diagnosis Date Noted  . Hypokalemia 06/12/2014  . Pulmonary hypertension ([place]) 06/12/2014  . UTI (urinary tract infection) 06/11/2014  . Atrial  fibrillation with RVR ([place]) 06/11/2014  . Acute diastolic heart failure ([place]) 06/11/2014  . A-fib ([place])   . Acute respiratory failure ([place]) 06/10/2014  . Metabolic acidosis 99991111  . Acute kidney injury ([place]) 06/08/2014  . Syncope and collapse 06/08/2014  . Normocytic anemia 06/08/2014  . Diabetes mellitus without complication ([place]) 99991111  . Essential hypertension 06/08/2014  . Hyperlipidemia 06/08/2014  . Edema extremities 06/08/2014  . Acute on chronic diastolic heart failure ([place]) 06/08/2014  . Faintness   . Contusion of face    Ailene Ravel, OTR/L,CBIS  [phone]  01/12/2016, 11:10 AM  [facility] [address] [place], [place], [address] Phone: [phone]   Fax:  [phone]  Name: Brenda Lee MRN: [id number] Date of Birth: 03/11/1951

## 2016-01-13 DIAGNOSIS — Z992 Dependence on renal dialysis: Secondary | ICD-10-CM | POA: Diagnosis not present

## 2016-01-13 DIAGNOSIS — E162 Hypoglycemia, unspecified: Secondary | ICD-10-CM | POA: Diagnosis not present

## 2016-01-13 DIAGNOSIS — D631 Anemia in chronic kidney disease: Secondary | ICD-10-CM | POA: Diagnosis not present

## 2016-01-13 DIAGNOSIS — D509 Iron deficiency anemia, unspecified: Secondary | ICD-10-CM | POA: Diagnosis not present

## 2016-01-13 DIAGNOSIS — N2581 Secondary hyperparathyroidism of renal origin: Secondary | ICD-10-CM | POA: Diagnosis not present

## 2016-01-13 DIAGNOSIS — N186 End stage renal disease: Secondary | ICD-10-CM | POA: Diagnosis not present

## 2016-01-15 DIAGNOSIS — N2581 Secondary hyperparathyroidism of renal origin: Secondary | ICD-10-CM | POA: Diagnosis not present

## 2016-01-15 DIAGNOSIS — E162 Hypoglycemia, unspecified: Secondary | ICD-10-CM | POA: Diagnosis not present

## 2016-01-15 DIAGNOSIS — D631 Anemia in chronic kidney disease: Secondary | ICD-10-CM | POA: Diagnosis not present

## 2016-01-15 DIAGNOSIS — Z992 Dependence on renal dialysis: Secondary | ICD-10-CM | POA: Diagnosis not present

## 2016-01-15 DIAGNOSIS — N186 End stage renal disease: Secondary | ICD-10-CM | POA: Diagnosis not present

## 2016-01-15 DIAGNOSIS — D509 Iron deficiency anemia, unspecified: Secondary | ICD-10-CM | POA: Diagnosis not present

## 2016-01-17 ENCOUNTER — Ambulatory Visit (HOSPITAL_COMMUNITY): Payer: Medicare Other

## 2016-01-17 ENCOUNTER — Encounter (HOSPITAL_COMMUNITY): Payer: Self-pay

## 2016-01-17 DIAGNOSIS — R29898 Other symptoms and signs involving the musculoskeletal system: Secondary | ICD-10-CM | POA: Diagnosis not present

## 2016-01-17 DIAGNOSIS — M25611 Stiffness of right shoulder, not elsewhere classified: Secondary | ICD-10-CM | POA: Diagnosis not present

## 2016-01-17 DIAGNOSIS — M25511 Pain in right shoulder: Secondary | ICD-10-CM | POA: Diagnosis not present

## 2016-01-17 NOTE — Therapy (Signed)
[place], [place], [address] Phone: [phone]   Fax:  [phone]  Occupational Therapy Treatment  Patient Details  Name: Brenda Lee MRN: [id number] Date of Birth: 1951-05-03 Referring Provider: Sanjuana Kava, MD  Encounter Date: 01/17/2016      OT End of Session - 01/17/16 1224    Visit Number 8   Number of Visits 16   Date for OT Re-Evaluation 02/18/16   Authorization Type 1) medicare Part A 2) BCBS supplement   Authorization Time Period before 10th visit   Authorization - Visit Number 8   Authorization - Number of Visits 10   OT Start Time 1117   OT Stop Time 1200   OT Time Calculation (min) 43 min   Activity Tolerance Patient tolerated treatment well   Behavior During Therapy [facility] for tasks assessed/performed      Past Medical History:  Diagnosis Date  . Acute kidney injury ([place]) 06/08/2014  . Acute on chronic diastolic heart failure ([place]) 06/08/2014  . Anemia   . Atrial fibrillation with RVR ([place]) 06/11/2014  . CHF (congestive heart failure) ([place])   . CKD (chronic kidney disease)   . Diabetes mellitus without complication ([place])   . Hypertension   . Pulmonary hypertension ([place]) 06/12/2014  . Renal insufficiency     Past Surgical History:  Procedure Laterality Date  . AV FISTULA PLACEMENT Left 09/07/2014   Procedure: ARTERIOVENOUS (AV) FISTULA CREATION-LEFT;  Surgeon: Elam Dutch, MD;  Location: [place];  Service: Vascular;  Laterality: Left;  . Jugular Cath Right Feb. 2, 2016   HD    There were no vitals filed for this visit.      Subjective Assessment - 01/17/16 1130    Currently in Pain? Yes   Pain Score 1    Pain Location Arm   Pain Orientation Right   Pain Descriptors / Indicators Sore   Pain Type Acute pain   Pain Radiating Towards N/A   Pain Onset 1 to 4 weeks ago   Pain Frequency Occasional   Aggravating Factors  movement and use   Pain Relieving Factors rest and pain  medication   Effect of Pain on Daily Activities None   Multiple Pain Sites No            [facility] OT Assessment - 01/17/16 1151      Assessment   Diagnosis closed fracture right proximal humerus     Precautions   Precautions None                  OT Treatments/Exercises (OP) - 01/17/16 1133      Exercises   Exercises Shoulder     Shoulder Exercises: Supine   Protraction PROM;AROM;10 reps   Horizontal ABduction PROM;AROM;10 reps   External Rotation PROM;AROM;10 reps   Internal Rotation PROM;AROM;10 reps   Flexion PROM;AROM;10 reps   ABduction PROM;AROM;10 reps     Shoulder Exercises: Seated   Protraction AROM;10 reps   Horizontal ABduction AROM;10 reps   External Rotation AROM;10 reps   Internal Rotation AROM;10 reps   Flexion AROM;10 reps   Abduction AROM;10 reps     Shoulder Exercises: Standing   Extension Theraband;10 reps   Theraband Level (Shoulder Extension) Level 2 (Red)   Row Theraband;10 reps   Theraband Level (Shoulder Row) Level 2 (Red)   Retraction Theraband;10 reps   Theraband Level (Shoulder Retraction) Level 2 (Red)     Shoulder Exercises: ROM/Strengthening  X to V Arms 10X   Proximal Shoulder Strengthening, Supine 10X no rest breaks.      Manual Therapy   Manual Therapy Myofascial release   Manual therapy comments manual therapy completed prior to exercises.    Myofascial Release Myofascial release and manual stretching completed to right upper arm, trapezius, and scapularis region to decrease fascial restrictions and increase joint mobility in a pain free zone.                 OT Education - 01/17/16 1145    Education provided Yes   Education Details A/ROM shoulder exercises.    Person(s) Educated Patient   Methods Explanation;Handout;Verbal cues;Demonstration   Comprehension Verbalized understanding;Returned demonstration          OT Short Term Goals - 12/27/15 1241      OT SHORT TERM GOAL #1   Title Patient  will be educated and independent with HEP to increase functional use of RUE during daily tasks.    Time 4   Period Weeks   Status On-going     OT SHORT TERM GOAL #2   Title Patient will increase P/ROM of shoulder and elbow  to [facility] to increase ability to get shirts on and off with less difficulty.    Time 4   Period Weeks   Status On-going     OT SHORT TERM GOAL #3   Title Patient will increase strength to 3+/5 to increase ability to complete tasks below shoulder.    Time 4   Period Weeks   Status On-going     OT SHORT TERM GOAL #4   Title Patient will decrease fascial restrictions to min amount to increase functional mobility of RUE needed for reaching tasks.    Time 4   Period Weeks   Status On-going     OT SHORT TERM GOAL #5   Title Patient will decrease pain level to 2/10 or less when using RUE during household activities.    Time 4   Period Weeks   Status On-going           OT Long Term Goals - 12/27/15 1242      OT LONG TERM GOAL #1   Title Patient will return to highest level of independence with all daily and leisure tasks using RUE.    Time 8   Period Weeks   Status On-going     OT LONG TERM GOAL #2   Title Patient will increase A/ROM to [facility] in shoulder and elbow to increase ability to brush hair and reach into overhead cabinets.    Time 8   Period Weeks   Status On-going     OT LONG TERM GOAL #3   Title Patient will increase shoulder strength to 4+/5 to increase ability to complete normal household chores with RUE.   Time 8   Period Weeks   Status On-going     OT LONG TERM GOAL #4   Title Patient will decrease pain level to 1/10 when using RUE during daily tasks.   Time 8   Period Weeks   Status On-going     OT LONG TERM GOAL #5   Title Patient will decrease fascial restrictions to min amount to increase functional mobility to complete reaching tasks.   Time 8   Period Weeks   Status On-going               Plan - 01/17/16 1225     Clinical Impression Statement  A: Progressed to A/ROM supine and seated this date. Also completed scapular theraband strengthening. Verbal and physical cueing used when needed for form and technique. Patient given A/ROM exercise for HEP.    Plan P: Continue with A/ROM exercises and add UBE bike.      Patient will benefit from skilled therapeutic intervention in order to improve the following deficits and impairments:  Decreased strength, Pain, Impaired UE functional use, Increased edema, Decreased range of motion, Increased fascial restricitons  Visit Diagnosis: Pain in right shoulder  Stiffness of right shoulder, not elsewhere classified  Other symptoms and signs involving the musculoskeletal system    Problem List Patient Active Problem List   Diagnosis Date Noted  . Hypokalemia 06/12/2014  . Pulmonary hypertension ([place]) 06/12/2014  . UTI (urinary tract infection) 06/11/2014  . Atrial fibrillation with RVR ([place]) 06/11/2014  . Acute diastolic heart failure ([place]) 06/11/2014  . A-fib ([place])   . Acute respiratory failure ([place]) 06/10/2014  . Metabolic acidosis 99991111  . Acute kidney injury ([place]) 06/08/2014  . Syncope and collapse 06/08/2014  . Normocytic anemia 06/08/2014  . Diabetes mellitus without complication ([place]) 99991111  . Essential hypertension 06/08/2014  . Hyperlipidemia 06/08/2014  . Edema extremities 06/08/2014  . Acute on chronic diastolic heart failure ([place]) 06/08/2014  . Faintness   . Contusion of face    Ailene Ravel, OTR/L,CBIS  [phone]  01/17/2016, 12:29 PM  [facility] [address] [place], [place], [address] Phone: [phone]   Fax:  [phone]  Name: Brenda Lee MRN: [id number] Date of Birth: [birth year]

## 2016-01-17 NOTE — Patient Instructions (Signed)
1) Shoulder Protraction    Begin with elbows by your side, slowly "punch" straight out in front of you.      2) Shoulder Flexion  Supine:     Standing:         Begin with arms at your side with thumbs pointed up, slowly raise both arms up and forward towards overhead.    3) Horizontal abduction/adduction  Supine:   Standing:           Begin with arms straight out in front of you, bring out to the side in at "T" shape. Keep arms straight entire time.    4) Internal & External Rotation    *No band* -Stand with elbows at the side and elbows bent 90 degrees. Move your forearms away from your body, then bring back inward toward the body.     5) Shoulder Abduction  Supine:     Standing:       Lying on your back begin with your arms flat on the table next to your side. Slowly move your arms out to the side so that they go overhead, in a jumping jack or snow angel movement.       Repeat all exercises 10-15 times, 1-2 times per day.  

## 2016-01-18 DIAGNOSIS — D631 Anemia in chronic kidney disease: Secondary | ICD-10-CM | POA: Diagnosis not present

## 2016-01-18 DIAGNOSIS — D509 Iron deficiency anemia, unspecified: Secondary | ICD-10-CM | POA: Diagnosis not present

## 2016-01-18 DIAGNOSIS — Z992 Dependence on renal dialysis: Secondary | ICD-10-CM | POA: Diagnosis not present

## 2016-01-18 DIAGNOSIS — N186 End stage renal disease: Secondary | ICD-10-CM | POA: Diagnosis not present

## 2016-01-18 DIAGNOSIS — N2581 Secondary hyperparathyroidism of renal origin: Secondary | ICD-10-CM | POA: Diagnosis not present

## 2016-01-18 DIAGNOSIS — E162 Hypoglycemia, unspecified: Secondary | ICD-10-CM | POA: Diagnosis not present

## 2016-01-19 ENCOUNTER — Encounter (HOSPITAL_COMMUNITY): Payer: Self-pay | Admitting: Occupational Therapy

## 2016-01-19 ENCOUNTER — Ambulatory Visit (HOSPITAL_COMMUNITY): Payer: Medicare Other | Admitting: Occupational Therapy

## 2016-01-19 DIAGNOSIS — R29898 Other symptoms and signs involving the musculoskeletal system: Secondary | ICD-10-CM | POA: Diagnosis not present

## 2016-01-19 DIAGNOSIS — M25611 Stiffness of right shoulder, not elsewhere classified: Secondary | ICD-10-CM | POA: Diagnosis not present

## 2016-01-19 DIAGNOSIS — M25511 Pain in right shoulder: Secondary | ICD-10-CM

## 2016-01-19 NOTE — Therapy (Signed)
[place], [place], [address] Phone: [phone]   Fax:  [phone]  Occupational Therapy Treatment  Patient Details  Name: Brenda Lee MRN: [id number] Date of Birth: Feb 12, 1951 Referring Provider: Sanjuana Kava, MD  Encounter Date: 01/19/2016      OT End of Session - 01/19/16 1109    Visit Number 9   Number of Visits 16   Date for OT Re-Evaluation 02/18/16   Authorization Type 1) medicare Part A 2) BCBS supplement   Authorization Time Period before 10th visit   Authorization - Visit Number 9   Authorization - Number of Visits 10   OT Start Time 1023   OT Stop Time 1104   OT Time Calculation (min) 41 min   Activity Tolerance Patient tolerated treatment well   Behavior During Therapy [facility] for tasks assessed/performed      Past Medical History:  Diagnosis Date  . Acute kidney injury ([place]) 06/08/2014  . Acute on chronic diastolic heart failure ([place]) 06/08/2014  . Anemia   . Atrial fibrillation with RVR ([place]) 06/11/2014  . CHF (congestive heart failure) ([place])   . CKD (chronic kidney disease)   . Diabetes mellitus without complication ([place])   . Hypertension   . Pulmonary hypertension ([place]) 06/12/2014  . Renal insufficiency     Past Surgical History:  Procedure Laterality Date  . AV FISTULA PLACEMENT Left 09/07/2014   Procedure: ARTERIOVENOUS (AV) FISTULA CREATION-LEFT;  Surgeon: Elam Dutch, MD;  Location: [place];  Service: Vascular;  Laterality: Left;  . Jugular Cath Right Feb. 2, 2016   HD    There were no vitals filed for this visit.      Subjective Assessment - 01/19/16 1023    Subjective  S: I wash a lot of dishes and do lots of laundry.    Currently in Pain? No/denies            [facility] OT Assessment - 01/19/16 1022      Assessment   Diagnosis closed fracture right proximal humerus     Precautions   Precautions None                  OT Treatments/Exercises (OP) -  01/19/16 1025      Exercises   Exercises Shoulder     Shoulder Exercises: Supine   Protraction PROM;AROM;10 reps   Horizontal ABduction PROM;AROM;10 reps   External Rotation PROM;AROM;10 reps   Internal Rotation PROM;AROM;10 reps   Flexion PROM;AROM;10 reps   ABduction PROM;AROM;10 reps     Shoulder Exercises: Seated   Protraction AROM;10 reps   Horizontal ABduction AROM;10 reps   External Rotation AROM;10 reps   Internal Rotation AROM;10 reps   Flexion AROM;10 reps   Abduction AROM;10 reps     Shoulder Exercises: Standing   Extension Theraband;10 reps   Theraband Level (Shoulder Extension) Level 2 (Red)   Row Theraband;10 reps   Theraband Level (Shoulder Row) Level 2 (Red)   Retraction Theraband;10 reps   Theraband Level (Shoulder Retraction) Level 2 (Red)     Shoulder Exercises: ROM/Strengthening   UBE (Upper Arm Bike) Level 1 2' forward 2' reverse  verbal cuing for speed and direction   Over Head Lace 1'   "W" Arms 10X  verbal and visual cuing   X to V Arms 10X   Proximal Shoulder Strengthening, Supine 10X no rest breaks.    Proximal Shoulder Strengthening, Seated 10X no rest breaks  Manual Therapy   Manual Therapy Myofascial release   Manual therapy comments manual therapy completed prior to exercises.    Myofascial Release Myofascial release and manual stretching completed to right upper arm, trapezius, and scapularis region to decrease fascial restrictions and increase joint mobility in a pain free zone.                   OT Short Term Goals - 12/27/15 1241      OT SHORT TERM GOAL #1   Title Patient will be educated and independent with HEP to increase functional use of RUE during daily tasks.    Time 4   Period Weeks   Status On-going     OT SHORT TERM GOAL #2   Title Patient will increase P/ROM of shoulder and elbow  to [facility] to increase ability to get shirts on and off with less difficulty.    Time 4   Period Weeks   Status On-going      OT SHORT TERM GOAL #3   Title Patient will increase strength to 3+/5 to increase ability to complete tasks below shoulder.    Time 4   Period Weeks   Status On-going     OT SHORT TERM GOAL #4   Title Patient will decrease fascial restrictions to min amount to increase functional mobility of RUE needed for reaching tasks.    Time 4   Period Weeks   Status On-going     OT SHORT TERM GOAL #5   Title Patient will decrease pain level to 2/10 or less when using RUE during household activities.    Time 4   Period Weeks   Status On-going           OT Long Term Goals - 12/27/15 1242      OT LONG TERM GOAL #1   Title Patient will return to highest level of independence with all daily and leisure tasks using RUE.    Time 8   Period Weeks   Status On-going     OT LONG TERM GOAL #2   Title Patient will increase A/ROM to [facility] in shoulder and elbow to increase ability to brush hair and reach into overhead cabinets.    Time 8   Period Weeks   Status On-going     OT LONG TERM GOAL #3   Title Patient will increase shoulder strength to 4+/5 to increase ability to complete normal household chores with RUE.   Time 8   Period Weeks   Status On-going     OT LONG TERM GOAL #4   Title Patient will decrease pain level to 1/10 when using RUE during daily tasks.   Time 8   Period Weeks   Status On-going     OT LONG TERM GOAL #5   Title Patient will decrease fascial restrictions to min amount to increase functional mobility to complete reaching tasks.   Time 8   Period Weeks   Status On-going               Plan - 01/19/16 1110    Clinical Impression Statement A: Added w arms, overhead lacing, and UBE this session, pt required verbal cuing for form during exercises, visual cuing for new exercises. Pt reports A/ROM HEP is going well. Continued scapular strengthening, verbal cuing for form and technique.    Rehab Potential Excellent   OT Frequency 2x / week   OT Duration 8 weeks    OT Treatment/Interventions  Self-care/ADL training;Ultrasound;DME and/or AE instruction;Patient/family education;Passive range of motion;Cryotherapy;Electrical Stimulation;Moist Heat;Therapeutic activities;Therapeutic exercises;Manual Therapy   Plan P: Continue with A/ROM and scapular theraband, focusing on independence in form and completion. Add scapular theraband to HEP when appropriate.       Patient will benefit from skilled therapeutic intervention in order to improve the following deficits and impairments:  Decreased strength, Pain, Impaired UE functional use, Increased edema, Decreased range of motion, Increased fascial restricitons  Visit Diagnosis: Pain in right shoulder  Stiffness of right shoulder, not elsewhere classified  Other symptoms and signs involving the musculoskeletal system    Problem List Patient Active Problem List   Diagnosis Date Noted  . Hypokalemia 06/12/2014  . Pulmonary hypertension ([place]) 06/12/2014  . UTI (urinary tract infection) 06/11/2014  . Atrial fibrillation with RVR ([place]) 06/11/2014  . Acute diastolic heart failure ([place]) 06/11/2014  . A-fib ([place])   . Acute respiratory failure ([place]) 06/10/2014  . Metabolic acidosis 99991111  . Acute kidney injury ([place]) 06/08/2014  . Syncope and collapse 06/08/2014  . Normocytic anemia 06/08/2014  . Diabetes mellitus without complication ([place]) 99991111  . Essential hypertension 06/08/2014  . Hyperlipidemia 06/08/2014  . Edema extremities 06/08/2014  . Acute on chronic diastolic heart failure ([place]) 06/08/2014  . Faintness   . Contusion of face    Brenda Lee, OTR/L  [phone] 01/19/2016, 11:13 AM  [facility] [address] [place], [place], [address] Phone: [phone]   Fax:  [phone]  Name: ANALEYAH AISPURO MRN: [id number] Date of Birth: 05/10/1951

## 2016-01-20 DIAGNOSIS — D509 Iron deficiency anemia, unspecified: Secondary | ICD-10-CM | POA: Diagnosis not present

## 2016-01-20 DIAGNOSIS — N2581 Secondary hyperparathyroidism of renal origin: Secondary | ICD-10-CM | POA: Diagnosis not present

## 2016-01-20 DIAGNOSIS — Z992 Dependence on renal dialysis: Secondary | ICD-10-CM | POA: Diagnosis not present

## 2016-01-20 DIAGNOSIS — E162 Hypoglycemia, unspecified: Secondary | ICD-10-CM | POA: Diagnosis not present

## 2016-01-20 DIAGNOSIS — N186 End stage renal disease: Secondary | ICD-10-CM | POA: Diagnosis not present

## 2016-01-20 DIAGNOSIS — D631 Anemia in chronic kidney disease: Secondary | ICD-10-CM | POA: Diagnosis not present

## 2016-01-23 DIAGNOSIS — D631 Anemia in chronic kidney disease: Secondary | ICD-10-CM | POA: Diagnosis not present

## 2016-01-23 DIAGNOSIS — N186 End stage renal disease: Secondary | ICD-10-CM | POA: Diagnosis not present

## 2016-01-23 DIAGNOSIS — E162 Hypoglycemia, unspecified: Secondary | ICD-10-CM | POA: Diagnosis not present

## 2016-01-23 DIAGNOSIS — D509 Iron deficiency anemia, unspecified: Secondary | ICD-10-CM | POA: Diagnosis not present

## 2016-01-23 DIAGNOSIS — Z992 Dependence on renal dialysis: Secondary | ICD-10-CM | POA: Diagnosis not present

## 2016-01-23 DIAGNOSIS — N2581 Secondary hyperparathyroidism of renal origin: Secondary | ICD-10-CM | POA: Diagnosis not present

## 2016-01-24 ENCOUNTER — Encounter (HOSPITAL_COMMUNITY): Payer: Self-pay

## 2016-01-24 ENCOUNTER — Ambulatory Visit (HOSPITAL_COMMUNITY): Payer: Medicare Other

## 2016-01-24 DIAGNOSIS — R29898 Other symptoms and signs involving the musculoskeletal system: Secondary | ICD-10-CM

## 2016-01-24 DIAGNOSIS — M25611 Stiffness of right shoulder, not elsewhere classified: Secondary | ICD-10-CM

## 2016-01-24 DIAGNOSIS — M25511 Pain in right shoulder: Secondary | ICD-10-CM | POA: Diagnosis not present

## 2016-01-24 NOTE — Patient Instructions (Signed)
Doorway Stretch  Place each hand opposite each other on the doorway. (You can change where you feel the stretch by moving arms higher or lower.) Step through with one foot and bend front knee until a stretch is felt and hold. Step through with the opposite foot on the next rep. Hold for _10____ seconds. Repeat _3___times.       Wall Flexion  Using a towel, slide your arm up the wall until a stretch is felt in your shoulder . Hold for 10 seconds. Complete 3 times.

## 2016-01-24 NOTE — Therapy (Addendum)
[place], [place], [address] Phone: [phone]   Fax:  [phone]  Occupational Therapy Treatment  Patient Details  Name: ALECE KOPPEL MRN: [id number] Date of Birth: 1950-09-22 Referring Provider: Sanjuana Kava, MD  Encounter Date: 01/24/2016      OT End of Session - 01/24/16 1158    Visit Number 10   Number of Visits 16   Date for OT Re-Evaluation 02/18/16   Authorization Type 1) medicare Part A 2) BCBS supplement   Authorization Time Period before 20th visit   Authorization - Visit Number 10   Authorization - Number of Visits 20   OT Start Time 1116   OT Stop Time 1200   OT Time Calculation (min) 44 min   Activity Tolerance Patient tolerated treatment well   Behavior During Therapy [facility] for tasks assessed/performed      Past Medical History:  Diagnosis Date  . Acute kidney injury ([place]) 06/08/2014  . Acute on chronic diastolic heart failure ([place]) 06/08/2014  . Anemia   . Atrial fibrillation with RVR ([place]) 06/11/2014  . CHF (congestive heart failure) ([place])   . CKD (chronic kidney disease)   . Diabetes mellitus without complication ([place])   . Hypertension   . Pulmonary hypertension ([place]) 06/12/2014  . Renal insufficiency     Past Surgical History:  Procedure Laterality Date  . AV FISTULA PLACEMENT Left 09/07/2014   Procedure: ARTERIOVENOUS (AV) FISTULA CREATION-LEFT;  Surgeon: Elam Dutch, MD;  Location: [place];  Service: Vascular;  Laterality: Left;  . Jugular Cath Right Feb. 2, 2016   HD    There were no vitals filed for this visit.      Subjective Assessment - 01/24/16 1138    Subjective  S: I can move my arm a lot better.    Currently in Pain? No/denies            [facility] OT Assessment - 01/24/16 1122      Assessment   Diagnosis closed fracture right proximal humerus     Precautions   Precautions None     ROM / Strength   AROM / PROM / Strength AROM;PROM;Strength     AROM   Overall AROM Comments Assessed seated/ IR/er adducted. A/ROM assessed for the first time this date.    AROM Assessment Site Shoulder   Right/Left Shoulder Right   Right Shoulder Flexion 110 Degrees   Right Shoulder ABduction 120 Degrees   Right Shoulder Internal Rotation 90 Degrees   Right Shoulder External Rotation 60 Degrees     PROM   Overall PROM Comments Assessed supine. IR/er adducted   PROM Assessment Site Shoulder   Right/Left Shoulder Right   Right Shoulder Flexion 170 Degrees  previous: 130   Right Shoulder ABduction 180 Degrees  previous: same   Right Shoulder Internal Rotation 90 Degrees  previous: same   Right Shoulder External Rotation 70 Degrees  previous: 22     Strength   Overall Strength Comments Assessed seated. IR/er adducted. Strength assessed for the first time this session.    Strength Assessment Site Shoulder   Right/Left Shoulder Right   Right Shoulder Flexion 4-/5   Right Shoulder ABduction 4-/5   Right Shoulder Internal Rotation 3/5   Right Shoulder External Rotation 3/5                  OT Treatments/Exercises (OP) - 01/24/16 1138      Exercises   Exercises Shoulder  Shoulder Exercises: Supine   Protraction PROM;10 reps   Horizontal ABduction PROM;10 reps   External Rotation PROM;10 reps   Internal Rotation PROM;10 reps   Flexion PROM;10 reps   ABduction PROM;10 reps     Shoulder Exercises: Standing   Protraction AROM;10 reps   Horizontal ABduction AROM;10 reps   External Rotation AROM;10 reps   Internal Rotation AROM;10 reps   Flexion AROM;10 reps   ABduction AROM;10 reps   Extension Theraband;12 reps   Theraband Level (Shoulder Extension) Level 2 (Red)   Row Theraband;12 reps   Theraband Level (Shoulder Row) Level 2 (Red)   Retraction Theraband;12 reps   Theraband Level (Shoulder Retraction) Level 2 (Red)     Shoulder Exercises: ROM/Strengthening   UBE (Upper Arm Bike) Level 1 2' forward 2' reverse   X to V Arms  10X  verbal and physical cues for proper form and technique     Manual Therapy   Manual Therapy Myofascial release   Manual therapy comments manual therapy completed prior to exercises.    Myofascial Release Myofascial release and manual stretching completed to right upper arm, trapezius, and scapularis region to decrease fascial restrictions and increase joint mobility in a pain free zone.                 OT Education - 01/24/16 1226    Education provided Yes   Education Details shoulder flexion and doorway stretch   Person(s) Educated Patient   Methods Explanation;Demonstration;Verbal cues;Handout   Comprehension Returned demonstration;Verbalized understanding          OT Short Term Goals - 12/27/15 1241      OT SHORT TERM GOAL #1   Title Patient will be educated and independent with HEP to increase functional use of RUE during daily tasks.    Time 4   Period Weeks   Status On-going     OT SHORT TERM GOAL #2   Title Patient will increase P/ROM of shoulder and elbow  to [facility] to increase ability to get shirts on and off with less difficulty.    Time 4   Period Weeks   Status On-going     OT SHORT TERM GOAL #3   Title Patient will increase strength to 3+/5 to increase ability to complete tasks below shoulder.    Time 4   Period Weeks   Status On-going     OT SHORT TERM GOAL #4   Title Patient will decrease fascial restrictions to min amount to increase functional mobility of RUE needed for reaching tasks.    Time 4   Period Weeks   Status On-going     OT SHORT TERM GOAL #5   Title Patient will decrease pain level to 2/10 or less when using RUE during household activities.    Time 4   Period Weeks   Status On-going           OT Long Term Goals - 12/27/15 1242      OT LONG TERM GOAL #1   Title Patient will return to highest level of independence with all daily and leisure tasks using RUE.    Time 8   Period Weeks   Status On-going     OT LONG TERM  GOAL #2   Title Patient will increase A/ROM to [facility] in shoulder and elbow to increase ability to brush hair and reach into overhead cabinets.    Time 8   Period Weeks   Status On-going  OT LONG TERM GOAL #3   Title Patient will increase shoulder strength to 4+/5 to increase ability to complete normal household chores with RUE.   Time 8   Period Weeks   Status On-going     OT LONG TERM GOAL #4   Title Patient will decrease pain level to 1/10 when using RUE during daily tasks.   Time 8   Period Weeks   Status On-going     OT LONG TERM GOAL #5   Title Patient will decrease fascial restrictions to min amount to increase functional mobility to complete reaching tasks.   Time 8   Period Weeks   Status On-going               Plan - 22-Feb-2016 1158    Clinical Impression Statement A: Pt reports that she is able to move her arm better although it is still difficult to reach overhead for items. Strength continues to be a deficit as well. Added shoulder stretches to HEP to increase shoulder flexion.   Plan P: Follow up on shoulder stretches from HEP. Continue to work on increasing shoulder flexion and abduction as well as functional reaching ability.       Patient will benefit from skilled therapeutic intervention in order to improve the following deficits and impairments:  Decreased strength, Pain, Impaired UE functional use, Increased edema, Decreased range of motion, Increased fascial restricitons  Visit Diagnosis: Stiffness of right shoulder, not elsewhere classified  Other symptoms and signs involving the musculoskeletal system      G-Codes - 02/22/16 1227    Functional Assessment Tool Used FOTO score: 67/100 (33% impaired)   Functional Limitation Carrying, moving and handling objects   Carrying, Moving and Handling Objects Current Status (D3570) At least 20 percent but less than 40 percent impaired, limited or restricted   Carrying, Moving and Handling Objects Goal  Status (V7793) At least 20 percent but less than 40 percent impaired, limited or restricted      Problem List Patient Active Problem List   Diagnosis Date Noted  . Hypokalemia 06/12/2014  . Pulmonary hypertension ([place]) 06/12/2014  . UTI (urinary tract infection) 06/11/2014  . Atrial fibrillation with RVR ([place]) 06/11/2014  . Acute diastolic heart failure ([place]) 06/11/2014  . A-fib ([place])   . Acute respiratory failure ([place]) 06/10/2014  . Metabolic acidosis 90/30/0923  . Acute kidney injury ([place]) 06/08/2014  . Syncope and collapse 06/08/2014  . Normocytic anemia 06/08/2014  . Diabetes mellitus without complication ([place]) 30/12/6224  . Essential hypertension 06/08/2014  . Hyperlipidemia 06/08/2014  . Edema extremities 06/08/2014  . Acute on chronic diastolic heart failure ([place]) 06/08/2014  . Faintness   . Contusion of face    Ailene Ravel, OTR/L,CBIS  [id number]  2016-02-22, 12:28 PM  [facility] [address] [place], [place], [address] Phone: [phone]   Fax:  [phone]  Name: KIMBLERY DIOP MRN: [id number] Date of Birth: Jul 21, 1950   OCCUPATIONAL THERAPY DISCHARGE SUMMARY  Visits from Start of Care: 10  Current functional level related to goals / functional outcomes: Based on this treatment session, patient has met all short term goals.   Remaining deficits: Patient continued to have deficits with strength as she was not ready to progress to weights at this session.   Education / Equipment: A/ROM exercises Plan: Patient agrees to discharge.  Patient goals were partially met. Patient is being discharged due to not returning since the last visit.  ?????  Ailene Ravel, OTR/L,CBIS  [phone]

## 2016-01-25 DIAGNOSIS — E162 Hypoglycemia, unspecified: Secondary | ICD-10-CM | POA: Diagnosis not present

## 2016-01-25 DIAGNOSIS — D631 Anemia in chronic kidney disease: Secondary | ICD-10-CM | POA: Diagnosis not present

## 2016-01-25 DIAGNOSIS — Z992 Dependence on renal dialysis: Secondary | ICD-10-CM | POA: Diagnosis not present

## 2016-01-25 DIAGNOSIS — N2581 Secondary hyperparathyroidism of renal origin: Secondary | ICD-10-CM | POA: Diagnosis not present

## 2016-01-25 DIAGNOSIS — N186 End stage renal disease: Secondary | ICD-10-CM | POA: Diagnosis not present

## 2016-01-25 DIAGNOSIS — D509 Iron deficiency anemia, unspecified: Secondary | ICD-10-CM | POA: Diagnosis not present

## 2016-01-26 ENCOUNTER — Encounter: Payer: Self-pay | Admitting: Orthopaedic Surgery

## 2016-01-26 ENCOUNTER — Ambulatory Visit (INDEPENDENT_AMBULATORY_CARE_PROVIDER_SITE_OTHER): Payer: Medicare Other | Admitting: Orthopaedic Surgery

## 2016-01-26 ENCOUNTER — Ambulatory Visit (HOSPITAL_COMMUNITY): Payer: Medicare Other | Admitting: Occupational Therapy

## 2016-01-26 VITALS — BP 204/92 | HR 78 | Ht 64.0 in | Wt 162.0 lb

## 2016-01-26 DIAGNOSIS — S42201D Unspecified fracture of upper end of right humerus, subsequent encounter for fracture with routine healing: Secondary | ICD-10-CM

## 2016-01-26 NOTE — Progress Notes (Signed)
CC:  I am just fine  She has been to OT and has full motion of the right shoulder now and no pain.  ROM is full right. She has no pain.  NV intact.  Encounter Diagnosis  Name Primary?  . Closed fracture of right proximal humerus, with routine healing, subsequent encounter Yes   Discharged.  Call if any problem.  Electronically Signed Sanjuana Kava, MD 8/23/201710:59 AM

## 2016-01-27 DIAGNOSIS — N2581 Secondary hyperparathyroidism of renal origin: Secondary | ICD-10-CM | POA: Diagnosis not present

## 2016-01-27 DIAGNOSIS — E162 Hypoglycemia, unspecified: Secondary | ICD-10-CM | POA: Diagnosis not present

## 2016-01-27 DIAGNOSIS — N186 End stage renal disease: Secondary | ICD-10-CM | POA: Diagnosis not present

## 2016-01-27 DIAGNOSIS — D509 Iron deficiency anemia, unspecified: Secondary | ICD-10-CM | POA: Diagnosis not present

## 2016-01-27 DIAGNOSIS — Z992 Dependence on renal dialysis: Secondary | ICD-10-CM | POA: Diagnosis not present

## 2016-01-27 DIAGNOSIS — D631 Anemia in chronic kidney disease: Secondary | ICD-10-CM | POA: Diagnosis not present

## 2016-01-29 DIAGNOSIS — E162 Hypoglycemia, unspecified: Secondary | ICD-10-CM | POA: Diagnosis not present

## 2016-01-29 DIAGNOSIS — D631 Anemia in chronic kidney disease: Secondary | ICD-10-CM | POA: Diagnosis not present

## 2016-01-29 DIAGNOSIS — D509 Iron deficiency anemia, unspecified: Secondary | ICD-10-CM | POA: Diagnosis not present

## 2016-01-29 DIAGNOSIS — N186 End stage renal disease: Secondary | ICD-10-CM | POA: Diagnosis not present

## 2016-01-29 DIAGNOSIS — Z992 Dependence on renal dialysis: Secondary | ICD-10-CM | POA: Diagnosis not present

## 2016-01-29 DIAGNOSIS — N2581 Secondary hyperparathyroidism of renal origin: Secondary | ICD-10-CM | POA: Diagnosis not present

## 2016-01-31 ENCOUNTER — Encounter (HOSPITAL_COMMUNITY): Payer: Medicare Other

## 2016-02-01 DIAGNOSIS — E162 Hypoglycemia, unspecified: Secondary | ICD-10-CM | POA: Diagnosis not present

## 2016-02-01 DIAGNOSIS — Z992 Dependence on renal dialysis: Secondary | ICD-10-CM | POA: Diagnosis not present

## 2016-02-01 DIAGNOSIS — N2581 Secondary hyperparathyroidism of renal origin: Secondary | ICD-10-CM | POA: Diagnosis not present

## 2016-02-01 DIAGNOSIS — N186 End stage renal disease: Secondary | ICD-10-CM | POA: Diagnosis not present

## 2016-02-01 DIAGNOSIS — D509 Iron deficiency anemia, unspecified: Secondary | ICD-10-CM | POA: Diagnosis not present

## 2016-02-01 DIAGNOSIS — D631 Anemia in chronic kidney disease: Secondary | ICD-10-CM | POA: Diagnosis not present

## 2016-02-02 ENCOUNTER — Encounter (HOSPITAL_COMMUNITY): Payer: Medicare Other | Admitting: Occupational Therapy

## 2016-02-03 DIAGNOSIS — E162 Hypoglycemia, unspecified: Secondary | ICD-10-CM | POA: Diagnosis not present

## 2016-02-03 DIAGNOSIS — D509 Iron deficiency anemia, unspecified: Secondary | ICD-10-CM | POA: Diagnosis not present

## 2016-02-03 DIAGNOSIS — D631 Anemia in chronic kidney disease: Secondary | ICD-10-CM | POA: Diagnosis not present

## 2016-02-03 DIAGNOSIS — N2581 Secondary hyperparathyroidism of renal origin: Secondary | ICD-10-CM | POA: Diagnosis not present

## 2016-02-03 DIAGNOSIS — N186 End stage renal disease: Secondary | ICD-10-CM | POA: Diagnosis not present

## 2016-02-03 DIAGNOSIS — Z992 Dependence on renal dialysis: Secondary | ICD-10-CM | POA: Diagnosis not present

## 2016-02-05 DIAGNOSIS — Z992 Dependence on renal dialysis: Secondary | ICD-10-CM | POA: Diagnosis not present

## 2016-02-05 DIAGNOSIS — N186 End stage renal disease: Secondary | ICD-10-CM | POA: Diagnosis not present

## 2016-02-05 DIAGNOSIS — D509 Iron deficiency anemia, unspecified: Secondary | ICD-10-CM | POA: Diagnosis not present

## 2016-02-05 DIAGNOSIS — E11649 Type 2 diabetes mellitus with hypoglycemia without coma: Secondary | ICD-10-CM | POA: Diagnosis not present

## 2016-02-05 DIAGNOSIS — E162 Hypoglycemia, unspecified: Secondary | ICD-10-CM | POA: Diagnosis not present

## 2016-02-05 DIAGNOSIS — Z794 Long term (current) use of insulin: Secondary | ICD-10-CM | POA: Diagnosis not present

## 2016-02-05 DIAGNOSIS — Z23 Encounter for immunization: Secondary | ICD-10-CM | POA: Diagnosis not present

## 2016-02-05 DIAGNOSIS — N2581 Secondary hyperparathyroidism of renal origin: Secondary | ICD-10-CM | POA: Diagnosis not present

## 2016-02-05 DIAGNOSIS — D631 Anemia in chronic kidney disease: Secondary | ICD-10-CM | POA: Diagnosis not present

## 2016-02-08 DIAGNOSIS — D509 Iron deficiency anemia, unspecified: Secondary | ICD-10-CM | POA: Diagnosis not present

## 2016-02-08 DIAGNOSIS — Z23 Encounter for immunization: Secondary | ICD-10-CM | POA: Diagnosis not present

## 2016-02-08 DIAGNOSIS — D631 Anemia in chronic kidney disease: Secondary | ICD-10-CM | POA: Diagnosis not present

## 2016-02-08 DIAGNOSIS — N186 End stage renal disease: Secondary | ICD-10-CM | POA: Diagnosis not present

## 2016-02-08 DIAGNOSIS — E162 Hypoglycemia, unspecified: Secondary | ICD-10-CM | POA: Diagnosis not present

## 2016-02-08 DIAGNOSIS — N2581 Secondary hyperparathyroidism of renal origin: Secondary | ICD-10-CM | POA: Diagnosis not present

## 2016-02-09 ENCOUNTER — Encounter (HOSPITAL_COMMUNITY): Payer: Medicare Other | Admitting: Occupational Therapy

## 2016-02-10 DIAGNOSIS — E162 Hypoglycemia, unspecified: Secondary | ICD-10-CM | POA: Diagnosis not present

## 2016-02-10 DIAGNOSIS — N2581 Secondary hyperparathyroidism of renal origin: Secondary | ICD-10-CM | POA: Diagnosis not present

## 2016-02-10 DIAGNOSIS — D631 Anemia in chronic kidney disease: Secondary | ICD-10-CM | POA: Diagnosis not present

## 2016-02-10 DIAGNOSIS — Z23 Encounter for immunization: Secondary | ICD-10-CM | POA: Diagnosis not present

## 2016-02-10 DIAGNOSIS — D509 Iron deficiency anemia, unspecified: Secondary | ICD-10-CM | POA: Diagnosis not present

## 2016-02-10 DIAGNOSIS — N186 End stage renal disease: Secondary | ICD-10-CM | POA: Diagnosis not present

## 2016-02-11 ENCOUNTER — Encounter (HOSPITAL_COMMUNITY): Payer: Medicare Other | Admitting: Occupational Therapy

## 2016-02-12 DIAGNOSIS — D509 Iron deficiency anemia, unspecified: Secondary | ICD-10-CM | POA: Diagnosis not present

## 2016-02-12 DIAGNOSIS — E162 Hypoglycemia, unspecified: Secondary | ICD-10-CM | POA: Diagnosis not present

## 2016-02-12 DIAGNOSIS — N186 End stage renal disease: Secondary | ICD-10-CM | POA: Diagnosis not present

## 2016-02-12 DIAGNOSIS — N2581 Secondary hyperparathyroidism of renal origin: Secondary | ICD-10-CM | POA: Diagnosis not present

## 2016-02-12 DIAGNOSIS — D631 Anemia in chronic kidney disease: Secondary | ICD-10-CM | POA: Diagnosis not present

## 2016-02-12 DIAGNOSIS — Z23 Encounter for immunization: Secondary | ICD-10-CM | POA: Diagnosis not present

## 2016-02-14 ENCOUNTER — Encounter (HOSPITAL_COMMUNITY): Payer: Medicare Other

## 2016-02-14 DIAGNOSIS — N2581 Secondary hyperparathyroidism of renal origin: Secondary | ICD-10-CM | POA: Diagnosis not present

## 2016-02-14 DIAGNOSIS — D631 Anemia in chronic kidney disease: Secondary | ICD-10-CM | POA: Diagnosis not present

## 2016-02-14 DIAGNOSIS — E162 Hypoglycemia, unspecified: Secondary | ICD-10-CM | POA: Diagnosis not present

## 2016-02-14 DIAGNOSIS — N186 End stage renal disease: Secondary | ICD-10-CM | POA: Diagnosis not present

## 2016-02-14 DIAGNOSIS — Z23 Encounter for immunization: Secondary | ICD-10-CM | POA: Diagnosis not present

## 2016-02-14 DIAGNOSIS — D509 Iron deficiency anemia, unspecified: Secondary | ICD-10-CM | POA: Diagnosis not present

## 2016-02-16 ENCOUNTER — Encounter (HOSPITAL_COMMUNITY): Payer: Medicare Other

## 2016-02-17 DIAGNOSIS — D509 Iron deficiency anemia, unspecified: Secondary | ICD-10-CM | POA: Diagnosis not present

## 2016-02-17 DIAGNOSIS — Z23 Encounter for immunization: Secondary | ICD-10-CM | POA: Diagnosis not present

## 2016-02-17 DIAGNOSIS — N186 End stage renal disease: Secondary | ICD-10-CM | POA: Diagnosis not present

## 2016-02-17 DIAGNOSIS — E162 Hypoglycemia, unspecified: Secondary | ICD-10-CM | POA: Diagnosis not present

## 2016-02-17 DIAGNOSIS — D631 Anemia in chronic kidney disease: Secondary | ICD-10-CM | POA: Diagnosis not present

## 2016-02-17 DIAGNOSIS — N2581 Secondary hyperparathyroidism of renal origin: Secondary | ICD-10-CM | POA: Diagnosis not present

## 2016-02-19 DIAGNOSIS — Z23 Encounter for immunization: Secondary | ICD-10-CM | POA: Diagnosis not present

## 2016-02-19 DIAGNOSIS — N186 End stage renal disease: Secondary | ICD-10-CM | POA: Diagnosis not present

## 2016-02-19 DIAGNOSIS — E162 Hypoglycemia, unspecified: Secondary | ICD-10-CM | POA: Diagnosis not present

## 2016-02-19 DIAGNOSIS — D631 Anemia in chronic kidney disease: Secondary | ICD-10-CM | POA: Diagnosis not present

## 2016-02-19 DIAGNOSIS — D509 Iron deficiency anemia, unspecified: Secondary | ICD-10-CM | POA: Diagnosis not present

## 2016-02-19 DIAGNOSIS — N2581 Secondary hyperparathyroidism of renal origin: Secondary | ICD-10-CM | POA: Diagnosis not present

## 2016-02-22 DIAGNOSIS — N186 End stage renal disease: Secondary | ICD-10-CM | POA: Diagnosis not present

## 2016-02-22 DIAGNOSIS — Z23 Encounter for immunization: Secondary | ICD-10-CM | POA: Diagnosis not present

## 2016-02-22 DIAGNOSIS — N2581 Secondary hyperparathyroidism of renal origin: Secondary | ICD-10-CM | POA: Diagnosis not present

## 2016-02-22 DIAGNOSIS — E162 Hypoglycemia, unspecified: Secondary | ICD-10-CM | POA: Diagnosis not present

## 2016-02-22 DIAGNOSIS — D631 Anemia in chronic kidney disease: Secondary | ICD-10-CM | POA: Diagnosis not present

## 2016-02-22 DIAGNOSIS — D509 Iron deficiency anemia, unspecified: Secondary | ICD-10-CM | POA: Diagnosis not present

## 2016-02-24 DIAGNOSIS — D509 Iron deficiency anemia, unspecified: Secondary | ICD-10-CM | POA: Diagnosis not present

## 2016-02-24 DIAGNOSIS — E162 Hypoglycemia, unspecified: Secondary | ICD-10-CM | POA: Diagnosis not present

## 2016-02-24 DIAGNOSIS — D631 Anemia in chronic kidney disease: Secondary | ICD-10-CM | POA: Diagnosis not present

## 2016-02-24 DIAGNOSIS — N186 End stage renal disease: Secondary | ICD-10-CM | POA: Diagnosis not present

## 2016-02-24 DIAGNOSIS — N2581 Secondary hyperparathyroidism of renal origin: Secondary | ICD-10-CM | POA: Diagnosis not present

## 2016-02-24 DIAGNOSIS — Z23 Encounter for immunization: Secondary | ICD-10-CM | POA: Diagnosis not present

## 2016-02-25 ENCOUNTER — Other Ambulatory Visit: Payer: Self-pay | Admitting: Cardiology

## 2016-02-26 DIAGNOSIS — N2581 Secondary hyperparathyroidism of renal origin: Secondary | ICD-10-CM | POA: Diagnosis not present

## 2016-02-26 DIAGNOSIS — Z23 Encounter for immunization: Secondary | ICD-10-CM | POA: Diagnosis not present

## 2016-02-26 DIAGNOSIS — E162 Hypoglycemia, unspecified: Secondary | ICD-10-CM | POA: Diagnosis not present

## 2016-02-26 DIAGNOSIS — D509 Iron deficiency anemia, unspecified: Secondary | ICD-10-CM | POA: Diagnosis not present

## 2016-02-26 DIAGNOSIS — D631 Anemia in chronic kidney disease: Secondary | ICD-10-CM | POA: Diagnosis not present

## 2016-02-26 DIAGNOSIS — N186 End stage renal disease: Secondary | ICD-10-CM | POA: Diagnosis not present

## 2016-02-29 DIAGNOSIS — E162 Hypoglycemia, unspecified: Secondary | ICD-10-CM | POA: Diagnosis not present

## 2016-02-29 DIAGNOSIS — D631 Anemia in chronic kidney disease: Secondary | ICD-10-CM | POA: Diagnosis not present

## 2016-02-29 DIAGNOSIS — N2581 Secondary hyperparathyroidism of renal origin: Secondary | ICD-10-CM | POA: Diagnosis not present

## 2016-02-29 DIAGNOSIS — N186 End stage renal disease: Secondary | ICD-10-CM | POA: Diagnosis not present

## 2016-02-29 DIAGNOSIS — D509 Iron deficiency anemia, unspecified: Secondary | ICD-10-CM | POA: Diagnosis not present

## 2016-02-29 DIAGNOSIS — Z794 Long term (current) use of insulin: Secondary | ICD-10-CM | POA: Diagnosis not present

## 2016-02-29 DIAGNOSIS — Z23 Encounter for immunization: Secondary | ICD-10-CM | POA: Diagnosis not present

## 2016-02-29 DIAGNOSIS — E119 Type 2 diabetes mellitus without complications: Secondary | ICD-10-CM | POA: Diagnosis not present

## 2016-03-02 DIAGNOSIS — Z23 Encounter for immunization: Secondary | ICD-10-CM | POA: Diagnosis not present

## 2016-03-02 DIAGNOSIS — N2581 Secondary hyperparathyroidism of renal origin: Secondary | ICD-10-CM | POA: Diagnosis not present

## 2016-03-02 DIAGNOSIS — D509 Iron deficiency anemia, unspecified: Secondary | ICD-10-CM | POA: Diagnosis not present

## 2016-03-02 DIAGNOSIS — D631 Anemia in chronic kidney disease: Secondary | ICD-10-CM | POA: Diagnosis not present

## 2016-03-02 DIAGNOSIS — E162 Hypoglycemia, unspecified: Secondary | ICD-10-CM | POA: Diagnosis not present

## 2016-03-02 DIAGNOSIS — N186 End stage renal disease: Secondary | ICD-10-CM | POA: Diagnosis not present

## 2016-03-04 DIAGNOSIS — D631 Anemia in chronic kidney disease: Secondary | ICD-10-CM | POA: Diagnosis not present

## 2016-03-04 DIAGNOSIS — N2581 Secondary hyperparathyroidism of renal origin: Secondary | ICD-10-CM | POA: Diagnosis not present

## 2016-03-04 DIAGNOSIS — E162 Hypoglycemia, unspecified: Secondary | ICD-10-CM | POA: Diagnosis not present

## 2016-03-04 DIAGNOSIS — D509 Iron deficiency anemia, unspecified: Secondary | ICD-10-CM | POA: Diagnosis not present

## 2016-03-04 DIAGNOSIS — Z23 Encounter for immunization: Secondary | ICD-10-CM | POA: Diagnosis not present

## 2016-03-04 DIAGNOSIS — N186 End stage renal disease: Secondary | ICD-10-CM | POA: Diagnosis not present

## 2016-03-07 DIAGNOSIS — Z794 Long term (current) use of insulin: Secondary | ICD-10-CM | POA: Diagnosis not present

## 2016-03-07 DIAGNOSIS — D631 Anemia in chronic kidney disease: Secondary | ICD-10-CM | POA: Diagnosis not present

## 2016-03-07 DIAGNOSIS — N186 End stage renal disease: Secondary | ICD-10-CM | POA: Diagnosis not present

## 2016-03-07 DIAGNOSIS — E162 Hypoglycemia, unspecified: Secondary | ICD-10-CM | POA: Diagnosis not present

## 2016-03-07 DIAGNOSIS — D509 Iron deficiency anemia, unspecified: Secondary | ICD-10-CM | POA: Diagnosis not present

## 2016-03-07 DIAGNOSIS — E11649 Type 2 diabetes mellitus with hypoglycemia without coma: Secondary | ICD-10-CM | POA: Diagnosis not present

## 2016-03-07 DIAGNOSIS — N2581 Secondary hyperparathyroidism of renal origin: Secondary | ICD-10-CM | POA: Diagnosis not present

## 2016-03-07 DIAGNOSIS — Z992 Dependence on renal dialysis: Secondary | ICD-10-CM | POA: Diagnosis not present

## 2016-03-09 DIAGNOSIS — Z992 Dependence on renal dialysis: Secondary | ICD-10-CM | POA: Diagnosis not present

## 2016-03-09 DIAGNOSIS — D631 Anemia in chronic kidney disease: Secondary | ICD-10-CM | POA: Diagnosis not present

## 2016-03-09 DIAGNOSIS — E162 Hypoglycemia, unspecified: Secondary | ICD-10-CM | POA: Diagnosis not present

## 2016-03-09 DIAGNOSIS — N186 End stage renal disease: Secondary | ICD-10-CM | POA: Diagnosis not present

## 2016-03-09 DIAGNOSIS — N2581 Secondary hyperparathyroidism of renal origin: Secondary | ICD-10-CM | POA: Diagnosis not present

## 2016-03-09 DIAGNOSIS — D509 Iron deficiency anemia, unspecified: Secondary | ICD-10-CM | POA: Diagnosis not present

## 2016-03-11 DIAGNOSIS — E162 Hypoglycemia, unspecified: Secondary | ICD-10-CM | POA: Diagnosis not present

## 2016-03-11 DIAGNOSIS — D631 Anemia in chronic kidney disease: Secondary | ICD-10-CM | POA: Diagnosis not present

## 2016-03-11 DIAGNOSIS — Z992 Dependence on renal dialysis: Secondary | ICD-10-CM | POA: Diagnosis not present

## 2016-03-11 DIAGNOSIS — N2581 Secondary hyperparathyroidism of renal origin: Secondary | ICD-10-CM | POA: Diagnosis not present

## 2016-03-11 DIAGNOSIS — D509 Iron deficiency anemia, unspecified: Secondary | ICD-10-CM | POA: Diagnosis not present

## 2016-03-11 DIAGNOSIS — N186 End stage renal disease: Secondary | ICD-10-CM | POA: Diagnosis not present

## 2016-03-14 DIAGNOSIS — D631 Anemia in chronic kidney disease: Secondary | ICD-10-CM | POA: Diagnosis not present

## 2016-03-14 DIAGNOSIS — N186 End stage renal disease: Secondary | ICD-10-CM | POA: Diagnosis not present

## 2016-03-14 DIAGNOSIS — D509 Iron deficiency anemia, unspecified: Secondary | ICD-10-CM | POA: Diagnosis not present

## 2016-03-14 DIAGNOSIS — N2581 Secondary hyperparathyroidism of renal origin: Secondary | ICD-10-CM | POA: Diagnosis not present

## 2016-03-14 DIAGNOSIS — Z992 Dependence on renal dialysis: Secondary | ICD-10-CM | POA: Diagnosis not present

## 2016-03-14 DIAGNOSIS — E162 Hypoglycemia, unspecified: Secondary | ICD-10-CM | POA: Diagnosis not present

## 2016-03-16 DIAGNOSIS — D631 Anemia in chronic kidney disease: Secondary | ICD-10-CM | POA: Diagnosis not present

## 2016-03-16 DIAGNOSIS — N186 End stage renal disease: Secondary | ICD-10-CM | POA: Diagnosis not present

## 2016-03-16 DIAGNOSIS — D509 Iron deficiency anemia, unspecified: Secondary | ICD-10-CM | POA: Diagnosis not present

## 2016-03-16 DIAGNOSIS — Z992 Dependence on renal dialysis: Secondary | ICD-10-CM | POA: Diagnosis not present

## 2016-03-16 DIAGNOSIS — N2581 Secondary hyperparathyroidism of renal origin: Secondary | ICD-10-CM | POA: Diagnosis not present

## 2016-03-16 DIAGNOSIS — E162 Hypoglycemia, unspecified: Secondary | ICD-10-CM | POA: Diagnosis not present

## 2016-03-18 DIAGNOSIS — N2581 Secondary hyperparathyroidism of renal origin: Secondary | ICD-10-CM | POA: Diagnosis not present

## 2016-03-18 DIAGNOSIS — D631 Anemia in chronic kidney disease: Secondary | ICD-10-CM | POA: Diagnosis not present

## 2016-03-18 DIAGNOSIS — N186 End stage renal disease: Secondary | ICD-10-CM | POA: Diagnosis not present

## 2016-03-18 DIAGNOSIS — E162 Hypoglycemia, unspecified: Secondary | ICD-10-CM | POA: Diagnosis not present

## 2016-03-18 DIAGNOSIS — Z992 Dependence on renal dialysis: Secondary | ICD-10-CM | POA: Diagnosis not present

## 2016-03-18 DIAGNOSIS — D509 Iron deficiency anemia, unspecified: Secondary | ICD-10-CM | POA: Diagnosis not present

## 2016-03-20 DIAGNOSIS — E1165 Type 2 diabetes mellitus with hyperglycemia: Secondary | ICD-10-CM | POA: Diagnosis not present

## 2016-03-20 DIAGNOSIS — R197 Diarrhea, unspecified: Secondary | ICD-10-CM | POA: Diagnosis not present

## 2016-03-20 DIAGNOSIS — Z299 Encounter for prophylactic measures, unspecified: Secondary | ICD-10-CM | POA: Diagnosis not present

## 2016-03-23 DIAGNOSIS — D631 Anemia in chronic kidney disease: Secondary | ICD-10-CM | POA: Diagnosis not present

## 2016-03-23 DIAGNOSIS — E162 Hypoglycemia, unspecified: Secondary | ICD-10-CM | POA: Diagnosis not present

## 2016-03-23 DIAGNOSIS — N2581 Secondary hyperparathyroidism of renal origin: Secondary | ICD-10-CM | POA: Diagnosis not present

## 2016-03-23 DIAGNOSIS — D509 Iron deficiency anemia, unspecified: Secondary | ICD-10-CM | POA: Diagnosis not present

## 2016-03-23 DIAGNOSIS — N186 End stage renal disease: Secondary | ICD-10-CM | POA: Diagnosis not present

## 2016-03-23 DIAGNOSIS — Z992 Dependence on renal dialysis: Secondary | ICD-10-CM | POA: Diagnosis not present

## 2016-03-25 DIAGNOSIS — D631 Anemia in chronic kidney disease: Secondary | ICD-10-CM | POA: Diagnosis not present

## 2016-03-25 DIAGNOSIS — Z992 Dependence on renal dialysis: Secondary | ICD-10-CM | POA: Diagnosis not present

## 2016-03-25 DIAGNOSIS — N186 End stage renal disease: Secondary | ICD-10-CM | POA: Diagnosis not present

## 2016-03-25 DIAGNOSIS — E162 Hypoglycemia, unspecified: Secondary | ICD-10-CM | POA: Diagnosis not present

## 2016-03-25 DIAGNOSIS — D509 Iron deficiency anemia, unspecified: Secondary | ICD-10-CM | POA: Diagnosis not present

## 2016-03-25 DIAGNOSIS — N2581 Secondary hyperparathyroidism of renal origin: Secondary | ICD-10-CM | POA: Diagnosis not present

## 2016-03-27 ENCOUNTER — Encounter: Payer: Self-pay | Admitting: *Deleted

## 2016-03-27 ENCOUNTER — Ambulatory Visit (INDEPENDENT_AMBULATORY_CARE_PROVIDER_SITE_OTHER): Payer: Medicare Other | Admitting: Cardiology

## 2016-03-27 ENCOUNTER — Encounter: Payer: Self-pay | Admitting: Cardiology

## 2016-03-27 VITALS — BP 168/73 | HR 67 | Ht 67.0 in | Wt 154.0 lb

## 2016-03-27 DIAGNOSIS — I5032 Chronic diastolic (congestive) heart failure: Secondary | ICD-10-CM

## 2016-03-27 DIAGNOSIS — I272 Pulmonary hypertension, unspecified: Secondary | ICD-10-CM

## 2016-03-27 DIAGNOSIS — Z992 Dependence on renal dialysis: Secondary | ICD-10-CM

## 2016-03-27 DIAGNOSIS — I10 Essential (primary) hypertension: Secondary | ICD-10-CM | POA: Diagnosis not present

## 2016-03-27 DIAGNOSIS — I4891 Unspecified atrial fibrillation: Secondary | ICD-10-CM | POA: Diagnosis not present

## 2016-03-27 DIAGNOSIS — N186 End stage renal disease: Secondary | ICD-10-CM

## 2016-03-27 NOTE — Progress Notes (Addendum)
Clinical Summary [name] is a 65 y.o.female seen today for follow up of the following medical problems.   1. Afib - on metoprolol for rate control.  - initially was not started on anticoag due to history of severe anemia requiring transfusion. Anemia has stabilized, she however has not wanted to start anticoag.   - no recent palpitations - compliant with meds. She continues to refuse anticoagulation  2. Chronic diastolic heart failure - echo shows LVEF 60-65%, grade II diastolic dysfunction, she also has some mild mitral stenosis (mean grad 5) and pulmonary HTN, RV function is normal.  - denies any LE edema. Denies any SOB or DOE. Symptoms significantly improved since starting HD  3. ESRD - followed by nephrlogogy - T, Th, Sat she has HD.   4. Pulmonary HTN elevated PASP on echo at 74. Likely multifactorial including grade II diastolic dysfunction with active fluid overload, mild to moderate gradient across MV consistent with mild to mod mitral stenosis. - repeat echo after she had started regular HD shows PASP 53, normal RV function  5. HTN - compliant with meds  Past Medical History:  Diagnosis Date  . Acute kidney injury ([place]) 06/08/2014  . Acute on chronic diastolic heart failure ([place]) 06/08/2014  . Anemia   . Atrial fibrillation with RVR ([place]) 06/11/2014  . CHF (congestive heart failure) ([place])   . CKD (chronic kidney disease)   . Diabetes mellitus without complication ([place])   . Hypertension   . Pulmonary hypertension 06/12/2014  . Renal insufficiency      Allergies  Allergen Reactions  . Morphine And Related Nausea And Vomiting     Current Outpatient Prescriptions  Medication Sig Dispense Refill  . aspirin 81 MG tablet Take 81 mg by mouth daily.    . calcium acetate (PHOSLO) 667 MG capsule Take 667-2,668 mg by mouth See admin instructions. Take 3 tablets at each meal and 1 with snacks    . diltiazem (CARDIZEM CD) 180 MG 24 hr capsule Take 1 capsule  (180 mg total) by mouth daily. New medication for your blood pressure and heart rate. 30 capsule 3  . fluticasone (FLONASE) 50 MCG/ACT nasal spray Place 2 sprays into both nostrils daily. 16 g 2  . furosemide (LASIX) 40 MG tablet Take 120 mg by mouth daily.    Marland Kitchen glimepiride (AMARYL) 2 MG tablet Take one-half tablet daily only if your blood sugar is greater than 160. Check your blood sugar daily. (Patient taking differently: Take 1 mg by mouth daily as needed (if blood sugar is greater than 160). Check blood sugar daily.)    . HYDROcodone-acetaminophen (NORCO/VICODIN) 5-325 MG tablet Take 1 tablet by mouth every 4 (four) hours as needed for moderate pain (Must last 15 days.Do not take and drive a car or use machinery.). 60 tablet 0  . loratadine (CLARITIN) 10 MG tablet Take 10 mg by mouth daily.    . metoprolol (LOPRESSOR) 50 MG tablet TAKE 1 TABLET BY MOUTH TWICE DAILY 60 tablet 0  . NIFEdipine (ADALAT CC) 90 MG 24 hr tablet Take 90 mg by mouth daily.  5  . omeprazole (PRILOSEC) 20 MG capsule Take 1 capsule (20 mg total) by mouth daily. For gastric acid reflux. 30 capsule 1.  . sodium chloride (OCEAN) 0.65 % SOLN nasal spray Place 1 spray into both nostrils as needed for congestion. 480 mL 0  . Vitamin D, Ergocalciferol, (DRISDOL) 50000 UNITS CAPS capsule Take 1 capsule (50,000 Units total) by  mouth every 7 (seven) days. (Patient taking differently: Take 50,000 Units by mouth every 7 (seven) days. On Thursday.) 30 capsule 3   No current facility-administered medications for this visit.      Past Surgical History:  Procedure Laterality Date  . AV FISTULA PLACEMENT Left 09/07/2014   Procedure: ARTERIOVENOUS (AV) FISTULA CREATION-LEFT;  Surgeon: Elam Dutch, MD;  Location: [facility];  Service: Vascular;  Laterality: Left;  . Jugular Cath Right Feb. 2, 2016   HD     Allergies  Allergen Reactions  . Morphine And Related Nausea And Vomiting      Family History  Problem Relation Age of  Onset  . Aneurysm Mother     She fell and Hit head  . Diabetes Father   . Heart attack Father   . Heart disease Father     Before age 16     Social History [name] reports that she has never smoked. She has never used smokeless tobacco. Ms. Bryand reports that she does not drink alcohol.   Review of Systems CONSTITUTIONAL: No weight loss, fever, chills, weakness or fatigue.  HEENT: Eyes: No visual loss, blurred vision, double vision or yellow sclerae.No hearing loss, sneezing, congestion, runny nose or sore throat.  SKIN: No rash or itching.  CARDIOVASCULAR: per HPI RESPIRATORY: No shortness of breath, cough or sputum.  GASTROINTESTINAL: No anorexia, nausea, vomiting or diarrhea. No abdominal pain or blood.  GENITOURINARY: No burning on urination, no polyuria NEUROLOGICAL: No headache, dizziness, syncope, paralysis, ataxia, numbness or tingling in the extremities. No change in bowel or bladder control.  MUSCULOSKELETAL: No muscle, back pain, joint pain or stiffness.  LYMPHATICS: No enlarged nodes. No history of splenectomy.  PSYCHIATRIC: No history of depression or anxiety.  ENDOCRINOLOGIC: No reports of sweating, cold or heat intolerance. No polyuria or polydipsia.  Marland Kitchen   Physical Examination Vitals:   03/27/16 1339  BP: (!) 168/73  Pulse: 67   Vitals:   03/27/16 1339  Weight: 154 lb (69.9 kg)  Height: 5\' 7"  (1.702 m)    Gen: resting comfortably, no acute distress HEENT: no scleral icterus, pupils equal round and reactive, no palptable cervical adenopathy,  CV: RRR, no m/r/g, no jvd Resp: Clear to auscultation bilaterally GI: abdomen is soft, non-tender, non-distended, normal bowel sounds, no hepatosplenomegaly MSK: extremities are warm, no edema.  Skin: warm, no rash Neuro:  no focal deficits Psych: appropriate affect   Diagnostic Studies  Jan 2016 Echo Study Conclusions  - Left ventricle: The cavity size was normal. Wall thickness was normal. Systolic  function was normal. The estimated ejection fraction was in the range of 60% to 65%. Wall motion was normal; there were no regional wall motion abnormalities. Features are consistent with a pseudonormal left ventricular filling pattern, with concomitant abnormal relaxation and increased filling pressure (grade 2 diastolic dysfunction). - Aortic valve: Moderately calcified annulus. Trileaflet; moderately thickened leaflets. - Mitral valve: Mildly to moderately calcified annulus. Mildly thickened leaflets . The findings are consistent with mild to moderate stenosis. There was mild regurgitation. Mean gradient (D): 5 mm Hg. - Left atrium: The atrium was severely dilated. - Right ventricle: The cavity size was moderately dilated. It shares the apex with the LV. Systolic function was normal. RV TAPSe is 2.0 cm. - Right atrium: The atrium was moderately dilated. - Pulmonary arteries: Systolic pressure was severely increased. PA peak pressure: 74 mm Hg (S). - Technically difficult study.   06/2014 VQ scan Perfusion: Small subsegmental defect is  noted and posterior segment of right lower lobe which matches abnormality described on ventilation exam. No other significant defects are noted.  IMPRESSION: Low probability of pulmonary embolus.   08/2014 Echo Study Conclusions  - Left ventricle: The cavity size was normal. Wall thickness was increased in a pattern of moderate LVH. Systolic function was normal. The estimated ejection fraction was in the range of 55% to 60%. Diastolic function is abnormal, indeterminate grade. - Aortic valve: Mildly calcified annulus. Trileaflet; mildly thickened leaflets. Valve area (VTI): 2.07 cm^2. Valve area (Vmax): 2.04 cm^2. Valve area (Vmean): 2.25 cm^2. - Mitral valve: Mildly calcified annulus. Mildly thickened leaflets . There was mild regurgitation. - Left atrium: The atrium was mildly dilated. - Right  atrium: The atrium was mildly dilated. - Pulmonary arteries: Systolic pressure was moderately increased. PA peak pressure: 53 mm Hg (S). PASP may be underestimated based on available TR spectral Doppler waveform. - Technically adequate study.    Assessment and Plan    1. Afib - no palpitations, she will continue metoprolol for rate control - CHADS2Vasc score is 3, she continues to refuse anticoagulation. Detailed discussion today about increased risk of stroke, she wishes not to start at this time.  - EKG in clinc today shows NSR  2. Chronic diastolic hear failure -  fluid removal mainly by HD though she does make some urine  - continue to monitor, no recent symptoms.   3. ESRD - HD per renal   4. Pulmonary HTN - PASP by echo has decreased since starting HD and controlling volume, suspect primarily due to her left sided dysfunction - continue HD and diuretics - ANA negative, VQ scan negative - no further workup at this time  5. HTN - elevated in clinic, she will submit bp log in 1 week.    F/u 6 months    Arnoldo Lenis, M.D.

## 2016-03-27 NOTE — Patient Instructions (Signed)
Your physician wants you to follow-up in: [place] will receive a reminder letter in the mail two months in advance. If you don't receive a letter, please call our office to schedule the follow-up appointment.   Your physician recommends that you continue on your current medications as directed. Please refer to the Current Medication list given to you today.  Your physician has requested that you regularly monitor and record your blood pressure readings at home FOR 1 [place]. Please use the same machine at the same time of day to check your readings and record them to bring to your follow-up visit.  Thank you for choosing [facility]!!

## 2016-03-28 DIAGNOSIS — Z992 Dependence on renal dialysis: Secondary | ICD-10-CM | POA: Diagnosis not present

## 2016-03-28 DIAGNOSIS — N186 End stage renal disease: Secondary | ICD-10-CM | POA: Diagnosis not present

## 2016-03-28 DIAGNOSIS — E162 Hypoglycemia, unspecified: Secondary | ICD-10-CM | POA: Diagnosis not present

## 2016-03-28 DIAGNOSIS — D509 Iron deficiency anemia, unspecified: Secondary | ICD-10-CM | POA: Diagnosis not present

## 2016-03-28 DIAGNOSIS — D631 Anemia in chronic kidney disease: Secondary | ICD-10-CM | POA: Diagnosis not present

## 2016-03-28 DIAGNOSIS — N2581 Secondary hyperparathyroidism of renal origin: Secondary | ICD-10-CM | POA: Diagnosis not present

## 2016-03-30 DIAGNOSIS — N186 End stage renal disease: Secondary | ICD-10-CM | POA: Diagnosis not present

## 2016-03-30 DIAGNOSIS — D631 Anemia in chronic kidney disease: Secondary | ICD-10-CM | POA: Diagnosis not present

## 2016-03-30 DIAGNOSIS — E162 Hypoglycemia, unspecified: Secondary | ICD-10-CM | POA: Diagnosis not present

## 2016-03-30 DIAGNOSIS — D509 Iron deficiency anemia, unspecified: Secondary | ICD-10-CM | POA: Diagnosis not present

## 2016-03-30 DIAGNOSIS — Z992 Dependence on renal dialysis: Secondary | ICD-10-CM | POA: Diagnosis not present

## 2016-03-30 DIAGNOSIS — N2581 Secondary hyperparathyroidism of renal origin: Secondary | ICD-10-CM | POA: Diagnosis not present

## 2016-04-01 DIAGNOSIS — N2581 Secondary hyperparathyroidism of renal origin: Secondary | ICD-10-CM | POA: Diagnosis not present

## 2016-04-01 DIAGNOSIS — D509 Iron deficiency anemia, unspecified: Secondary | ICD-10-CM | POA: Diagnosis not present

## 2016-04-01 DIAGNOSIS — E162 Hypoglycemia, unspecified: Secondary | ICD-10-CM | POA: Diagnosis not present

## 2016-04-01 DIAGNOSIS — Z992 Dependence on renal dialysis: Secondary | ICD-10-CM | POA: Diagnosis not present

## 2016-04-01 DIAGNOSIS — D631 Anemia in chronic kidney disease: Secondary | ICD-10-CM | POA: Diagnosis not present

## 2016-04-01 DIAGNOSIS — N186 End stage renal disease: Secondary | ICD-10-CM | POA: Diagnosis not present

## 2016-04-04 DIAGNOSIS — N186 End stage renal disease: Secondary | ICD-10-CM | POA: Diagnosis not present

## 2016-04-04 DIAGNOSIS — D509 Iron deficiency anemia, unspecified: Secondary | ICD-10-CM | POA: Diagnosis not present

## 2016-04-04 DIAGNOSIS — D631 Anemia in chronic kidney disease: Secondary | ICD-10-CM | POA: Diagnosis not present

## 2016-04-04 DIAGNOSIS — E162 Hypoglycemia, unspecified: Secondary | ICD-10-CM | POA: Diagnosis not present

## 2016-04-04 DIAGNOSIS — Z992 Dependence on renal dialysis: Secondary | ICD-10-CM | POA: Diagnosis not present

## 2016-04-04 DIAGNOSIS — N2581 Secondary hyperparathyroidism of renal origin: Secondary | ICD-10-CM | POA: Diagnosis not present

## 2016-04-06 DIAGNOSIS — N186 End stage renal disease: Secondary | ICD-10-CM | POA: Diagnosis not present

## 2016-04-06 DIAGNOSIS — D631 Anemia in chronic kidney disease: Secondary | ICD-10-CM | POA: Diagnosis not present

## 2016-04-06 DIAGNOSIS — D509 Iron deficiency anemia, unspecified: Secondary | ICD-10-CM | POA: Diagnosis not present

## 2016-04-06 DIAGNOSIS — E162 Hypoglycemia, unspecified: Secondary | ICD-10-CM | POA: Diagnosis not present

## 2016-04-06 DIAGNOSIS — N2581 Secondary hyperparathyroidism of renal origin: Secondary | ICD-10-CM | POA: Diagnosis not present

## 2016-04-06 DIAGNOSIS — E11649 Type 2 diabetes mellitus with hypoglycemia without coma: Secondary | ICD-10-CM | POA: Diagnosis not present

## 2016-04-06 DIAGNOSIS — Z794 Long term (current) use of insulin: Secondary | ICD-10-CM | POA: Diagnosis not present

## 2016-04-06 DIAGNOSIS — Z992 Dependence on renal dialysis: Secondary | ICD-10-CM | POA: Diagnosis not present

## 2016-04-08 DIAGNOSIS — D631 Anemia in chronic kidney disease: Secondary | ICD-10-CM | POA: Diagnosis not present

## 2016-04-08 DIAGNOSIS — N2581 Secondary hyperparathyroidism of renal origin: Secondary | ICD-10-CM | POA: Diagnosis not present

## 2016-04-08 DIAGNOSIS — D509 Iron deficiency anemia, unspecified: Secondary | ICD-10-CM | POA: Diagnosis not present

## 2016-04-08 DIAGNOSIS — N186 End stage renal disease: Secondary | ICD-10-CM | POA: Diagnosis not present

## 2016-04-08 DIAGNOSIS — Z992 Dependence on renal dialysis: Secondary | ICD-10-CM | POA: Diagnosis not present

## 2016-04-08 DIAGNOSIS — E162 Hypoglycemia, unspecified: Secondary | ICD-10-CM | POA: Diagnosis not present

## 2016-04-11 DIAGNOSIS — D631 Anemia in chronic kidney disease: Secondary | ICD-10-CM | POA: Diagnosis not present

## 2016-04-11 DIAGNOSIS — N186 End stage renal disease: Secondary | ICD-10-CM | POA: Diagnosis not present

## 2016-04-11 DIAGNOSIS — D509 Iron deficiency anemia, unspecified: Secondary | ICD-10-CM | POA: Diagnosis not present

## 2016-04-11 DIAGNOSIS — Z992 Dependence on renal dialysis: Secondary | ICD-10-CM | POA: Diagnosis not present

## 2016-04-11 DIAGNOSIS — E162 Hypoglycemia, unspecified: Secondary | ICD-10-CM | POA: Diagnosis not present

## 2016-04-11 DIAGNOSIS — N2581 Secondary hyperparathyroidism of renal origin: Secondary | ICD-10-CM | POA: Diagnosis not present

## 2016-04-13 DIAGNOSIS — E162 Hypoglycemia, unspecified: Secondary | ICD-10-CM | POA: Diagnosis not present

## 2016-04-13 DIAGNOSIS — Z992 Dependence on renal dialysis: Secondary | ICD-10-CM | POA: Diagnosis not present

## 2016-04-13 DIAGNOSIS — N2581 Secondary hyperparathyroidism of renal origin: Secondary | ICD-10-CM | POA: Diagnosis not present

## 2016-04-13 DIAGNOSIS — D631 Anemia in chronic kidney disease: Secondary | ICD-10-CM | POA: Diagnosis not present

## 2016-04-13 DIAGNOSIS — N186 End stage renal disease: Secondary | ICD-10-CM | POA: Diagnosis not present

## 2016-04-13 DIAGNOSIS — D509 Iron deficiency anemia, unspecified: Secondary | ICD-10-CM | POA: Diagnosis not present

## 2016-04-15 DIAGNOSIS — N2581 Secondary hyperparathyroidism of renal origin: Secondary | ICD-10-CM | POA: Diagnosis not present

## 2016-04-15 DIAGNOSIS — D631 Anemia in chronic kidney disease: Secondary | ICD-10-CM | POA: Diagnosis not present

## 2016-04-15 DIAGNOSIS — D509 Iron deficiency anemia, unspecified: Secondary | ICD-10-CM | POA: Diagnosis not present

## 2016-04-15 DIAGNOSIS — E162 Hypoglycemia, unspecified: Secondary | ICD-10-CM | POA: Diagnosis not present

## 2016-04-15 DIAGNOSIS — N186 End stage renal disease: Secondary | ICD-10-CM | POA: Diagnosis not present

## 2016-04-15 DIAGNOSIS — Z992 Dependence on renal dialysis: Secondary | ICD-10-CM | POA: Diagnosis not present

## 2016-04-20 DIAGNOSIS — D631 Anemia in chronic kidney disease: Secondary | ICD-10-CM | POA: Diagnosis not present

## 2016-04-20 DIAGNOSIS — D509 Iron deficiency anemia, unspecified: Secondary | ICD-10-CM | POA: Diagnosis not present

## 2016-04-20 DIAGNOSIS — N186 End stage renal disease: Secondary | ICD-10-CM | POA: Diagnosis not present

## 2016-04-20 DIAGNOSIS — N2581 Secondary hyperparathyroidism of renal origin: Secondary | ICD-10-CM | POA: Diagnosis not present

## 2016-04-20 DIAGNOSIS — E162 Hypoglycemia, unspecified: Secondary | ICD-10-CM | POA: Diagnosis not present

## 2016-04-20 DIAGNOSIS — Z992 Dependence on renal dialysis: Secondary | ICD-10-CM | POA: Diagnosis not present

## 2016-04-22 DIAGNOSIS — D631 Anemia in chronic kidney disease: Secondary | ICD-10-CM | POA: Diagnosis not present

## 2016-04-22 DIAGNOSIS — N2581 Secondary hyperparathyroidism of renal origin: Secondary | ICD-10-CM | POA: Diagnosis not present

## 2016-04-22 DIAGNOSIS — D509 Iron deficiency anemia, unspecified: Secondary | ICD-10-CM | POA: Diagnosis not present

## 2016-04-22 DIAGNOSIS — N186 End stage renal disease: Secondary | ICD-10-CM | POA: Diagnosis not present

## 2016-04-22 DIAGNOSIS — Z992 Dependence on renal dialysis: Secondary | ICD-10-CM | POA: Diagnosis not present

## 2016-04-22 DIAGNOSIS — E162 Hypoglycemia, unspecified: Secondary | ICD-10-CM | POA: Diagnosis not present

## 2016-04-25 DIAGNOSIS — E162 Hypoglycemia, unspecified: Secondary | ICD-10-CM | POA: Diagnosis not present

## 2016-04-25 DIAGNOSIS — N186 End stage renal disease: Secondary | ICD-10-CM | POA: Diagnosis not present

## 2016-04-25 DIAGNOSIS — D631 Anemia in chronic kidney disease: Secondary | ICD-10-CM | POA: Diagnosis not present

## 2016-04-25 DIAGNOSIS — Z992 Dependence on renal dialysis: Secondary | ICD-10-CM | POA: Diagnosis not present

## 2016-04-25 DIAGNOSIS — N2581 Secondary hyperparathyroidism of renal origin: Secondary | ICD-10-CM | POA: Diagnosis not present

## 2016-04-25 DIAGNOSIS — D509 Iron deficiency anemia, unspecified: Secondary | ICD-10-CM | POA: Diagnosis not present

## 2016-04-29 DIAGNOSIS — D631 Anemia in chronic kidney disease: Secondary | ICD-10-CM | POA: Diagnosis not present

## 2016-04-29 DIAGNOSIS — N186 End stage renal disease: Secondary | ICD-10-CM | POA: Diagnosis not present

## 2016-04-29 DIAGNOSIS — N2581 Secondary hyperparathyroidism of renal origin: Secondary | ICD-10-CM | POA: Diagnosis not present

## 2016-04-29 DIAGNOSIS — D509 Iron deficiency anemia, unspecified: Secondary | ICD-10-CM | POA: Diagnosis not present

## 2016-04-29 DIAGNOSIS — E162 Hypoglycemia, unspecified: Secondary | ICD-10-CM | POA: Diagnosis not present

## 2016-04-29 DIAGNOSIS — Z992 Dependence on renal dialysis: Secondary | ICD-10-CM | POA: Diagnosis not present

## 2016-05-02 DIAGNOSIS — N186 End stage renal disease: Secondary | ICD-10-CM | POA: Diagnosis not present

## 2016-05-02 DIAGNOSIS — N2581 Secondary hyperparathyroidism of renal origin: Secondary | ICD-10-CM | POA: Diagnosis not present

## 2016-05-02 DIAGNOSIS — Z992 Dependence on renal dialysis: Secondary | ICD-10-CM | POA: Diagnosis not present

## 2016-05-02 DIAGNOSIS — D631 Anemia in chronic kidney disease: Secondary | ICD-10-CM | POA: Diagnosis not present

## 2016-05-02 DIAGNOSIS — D509 Iron deficiency anemia, unspecified: Secondary | ICD-10-CM | POA: Diagnosis not present

## 2016-05-02 DIAGNOSIS — E162 Hypoglycemia, unspecified: Secondary | ICD-10-CM | POA: Diagnosis not present

## 2016-05-04 DIAGNOSIS — N186 End stage renal disease: Secondary | ICD-10-CM | POA: Diagnosis not present

## 2016-05-04 DIAGNOSIS — Z992 Dependence on renal dialysis: Secondary | ICD-10-CM | POA: Diagnosis not present

## 2016-05-06 DIAGNOSIS — E162 Hypoglycemia, unspecified: Secondary | ICD-10-CM | POA: Diagnosis not present

## 2016-05-06 DIAGNOSIS — Z992 Dependence on renal dialysis: Secondary | ICD-10-CM | POA: Diagnosis not present

## 2016-05-06 DIAGNOSIS — N2581 Secondary hyperparathyroidism of renal origin: Secondary | ICD-10-CM | POA: Diagnosis not present

## 2016-05-06 DIAGNOSIS — E11649 Type 2 diabetes mellitus with hypoglycemia without coma: Secondary | ICD-10-CM | POA: Diagnosis not present

## 2016-05-06 DIAGNOSIS — N186 End stage renal disease: Secondary | ICD-10-CM | POA: Diagnosis not present

## 2016-05-06 DIAGNOSIS — D631 Anemia in chronic kidney disease: Secondary | ICD-10-CM | POA: Diagnosis not present

## 2016-05-06 DIAGNOSIS — D509 Iron deficiency anemia, unspecified: Secondary | ICD-10-CM | POA: Diagnosis not present

## 2016-05-06 DIAGNOSIS — Z794 Long term (current) use of insulin: Secondary | ICD-10-CM | POA: Diagnosis not present

## 2016-05-09 DIAGNOSIS — D509 Iron deficiency anemia, unspecified: Secondary | ICD-10-CM | POA: Diagnosis not present

## 2016-05-09 DIAGNOSIS — N2581 Secondary hyperparathyroidism of renal origin: Secondary | ICD-10-CM | POA: Diagnosis not present

## 2016-05-09 DIAGNOSIS — D631 Anemia in chronic kidney disease: Secondary | ICD-10-CM | POA: Diagnosis not present

## 2016-05-09 DIAGNOSIS — N186 End stage renal disease: Secondary | ICD-10-CM | POA: Diagnosis not present

## 2016-05-09 DIAGNOSIS — Z992 Dependence on renal dialysis: Secondary | ICD-10-CM | POA: Diagnosis not present

## 2016-05-09 DIAGNOSIS — E162 Hypoglycemia, unspecified: Secondary | ICD-10-CM | POA: Diagnosis not present

## 2016-05-11 DIAGNOSIS — N2581 Secondary hyperparathyroidism of renal origin: Secondary | ICD-10-CM | POA: Diagnosis not present

## 2016-05-11 DIAGNOSIS — N186 End stage renal disease: Secondary | ICD-10-CM | POA: Diagnosis not present

## 2016-05-11 DIAGNOSIS — Z992 Dependence on renal dialysis: Secondary | ICD-10-CM | POA: Diagnosis not present

## 2016-05-11 DIAGNOSIS — D631 Anemia in chronic kidney disease: Secondary | ICD-10-CM | POA: Diagnosis not present

## 2016-05-11 DIAGNOSIS — E162 Hypoglycemia, unspecified: Secondary | ICD-10-CM | POA: Diagnosis not present

## 2016-05-11 DIAGNOSIS — D509 Iron deficiency anemia, unspecified: Secondary | ICD-10-CM | POA: Diagnosis not present

## 2016-05-15 DIAGNOSIS — Z992 Dependence on renal dialysis: Secondary | ICD-10-CM | POA: Diagnosis not present

## 2016-05-15 DIAGNOSIS — N2581 Secondary hyperparathyroidism of renal origin: Secondary | ICD-10-CM | POA: Diagnosis not present

## 2016-05-15 DIAGNOSIS — D631 Anemia in chronic kidney disease: Secondary | ICD-10-CM | POA: Diagnosis not present

## 2016-05-15 DIAGNOSIS — D509 Iron deficiency anemia, unspecified: Secondary | ICD-10-CM | POA: Diagnosis not present

## 2016-05-15 DIAGNOSIS — E162 Hypoglycemia, unspecified: Secondary | ICD-10-CM | POA: Diagnosis not present

## 2016-05-15 DIAGNOSIS — N186 End stage renal disease: Secondary | ICD-10-CM | POA: Diagnosis not present

## 2016-05-18 DIAGNOSIS — N2581 Secondary hyperparathyroidism of renal origin: Secondary | ICD-10-CM | POA: Diagnosis not present

## 2016-05-18 DIAGNOSIS — E162 Hypoglycemia, unspecified: Secondary | ICD-10-CM | POA: Diagnosis not present

## 2016-05-18 DIAGNOSIS — D631 Anemia in chronic kidney disease: Secondary | ICD-10-CM | POA: Diagnosis not present

## 2016-05-18 DIAGNOSIS — Z992 Dependence on renal dialysis: Secondary | ICD-10-CM | POA: Diagnosis not present

## 2016-05-18 DIAGNOSIS — N186 End stage renal disease: Secondary | ICD-10-CM | POA: Diagnosis not present

## 2016-05-18 DIAGNOSIS — D509 Iron deficiency anemia, unspecified: Secondary | ICD-10-CM | POA: Diagnosis not present

## 2016-05-20 DIAGNOSIS — N2581 Secondary hyperparathyroidism of renal origin: Secondary | ICD-10-CM | POA: Diagnosis not present

## 2016-05-20 DIAGNOSIS — E162 Hypoglycemia, unspecified: Secondary | ICD-10-CM | POA: Diagnosis not present

## 2016-05-20 DIAGNOSIS — Z992 Dependence on renal dialysis: Secondary | ICD-10-CM | POA: Diagnosis not present

## 2016-05-20 DIAGNOSIS — N186 End stage renal disease: Secondary | ICD-10-CM | POA: Diagnosis not present

## 2016-05-20 DIAGNOSIS — D509 Iron deficiency anemia, unspecified: Secondary | ICD-10-CM | POA: Diagnosis not present

## 2016-05-20 DIAGNOSIS — D631 Anemia in chronic kidney disease: Secondary | ICD-10-CM | POA: Diagnosis not present

## 2016-05-22 ENCOUNTER — Other Ambulatory Visit: Payer: Self-pay | Admitting: Cardiology

## 2016-05-23 DIAGNOSIS — N2581 Secondary hyperparathyroidism of renal origin: Secondary | ICD-10-CM | POA: Diagnosis not present

## 2016-05-23 DIAGNOSIS — D631 Anemia in chronic kidney disease: Secondary | ICD-10-CM | POA: Diagnosis not present

## 2016-05-23 DIAGNOSIS — D509 Iron deficiency anemia, unspecified: Secondary | ICD-10-CM | POA: Diagnosis not present

## 2016-05-23 DIAGNOSIS — N186 End stage renal disease: Secondary | ICD-10-CM | POA: Diagnosis not present

## 2016-05-23 DIAGNOSIS — E162 Hypoglycemia, unspecified: Secondary | ICD-10-CM | POA: Diagnosis not present

## 2016-05-23 DIAGNOSIS — Z992 Dependence on renal dialysis: Secondary | ICD-10-CM | POA: Diagnosis not present

## 2016-05-25 DIAGNOSIS — Z992 Dependence on renal dialysis: Secondary | ICD-10-CM | POA: Diagnosis not present

## 2016-05-25 DIAGNOSIS — E119 Type 2 diabetes mellitus without complications: Secondary | ICD-10-CM | POA: Diagnosis not present

## 2016-05-25 DIAGNOSIS — N2581 Secondary hyperparathyroidism of renal origin: Secondary | ICD-10-CM | POA: Diagnosis not present

## 2016-05-25 DIAGNOSIS — N186 End stage renal disease: Secondary | ICD-10-CM | POA: Diagnosis not present

## 2016-05-25 DIAGNOSIS — Z794 Long term (current) use of insulin: Secondary | ICD-10-CM | POA: Diagnosis not present

## 2016-05-25 DIAGNOSIS — D509 Iron deficiency anemia, unspecified: Secondary | ICD-10-CM | POA: Diagnosis not present

## 2016-05-25 DIAGNOSIS — E162 Hypoglycemia, unspecified: Secondary | ICD-10-CM | POA: Diagnosis not present

## 2016-05-25 DIAGNOSIS — D631 Anemia in chronic kidney disease: Secondary | ICD-10-CM | POA: Diagnosis not present

## 2016-05-27 DIAGNOSIS — E162 Hypoglycemia, unspecified: Secondary | ICD-10-CM | POA: Diagnosis not present

## 2016-05-27 DIAGNOSIS — D509 Iron deficiency anemia, unspecified: Secondary | ICD-10-CM | POA: Diagnosis not present

## 2016-05-27 DIAGNOSIS — N2581 Secondary hyperparathyroidism of renal origin: Secondary | ICD-10-CM | POA: Diagnosis not present

## 2016-05-27 DIAGNOSIS — Z992 Dependence on renal dialysis: Secondary | ICD-10-CM | POA: Diagnosis not present

## 2016-05-27 DIAGNOSIS — N186 End stage renal disease: Secondary | ICD-10-CM | POA: Diagnosis not present

## 2016-05-27 DIAGNOSIS — D631 Anemia in chronic kidney disease: Secondary | ICD-10-CM | POA: Diagnosis not present

## 2016-05-30 DIAGNOSIS — D631 Anemia in chronic kidney disease: Secondary | ICD-10-CM | POA: Diagnosis not present

## 2016-05-30 DIAGNOSIS — Z992 Dependence on renal dialysis: Secondary | ICD-10-CM | POA: Diagnosis not present

## 2016-05-30 DIAGNOSIS — D509 Iron deficiency anemia, unspecified: Secondary | ICD-10-CM | POA: Diagnosis not present

## 2016-05-30 DIAGNOSIS — E162 Hypoglycemia, unspecified: Secondary | ICD-10-CM | POA: Diagnosis not present

## 2016-05-30 DIAGNOSIS — N2581 Secondary hyperparathyroidism of renal origin: Secondary | ICD-10-CM | POA: Diagnosis not present

## 2016-05-30 DIAGNOSIS — N186 End stage renal disease: Secondary | ICD-10-CM | POA: Diagnosis not present

## 2016-06-01 DIAGNOSIS — E162 Hypoglycemia, unspecified: Secondary | ICD-10-CM | POA: Diagnosis not present

## 2016-06-01 DIAGNOSIS — D631 Anemia in chronic kidney disease: Secondary | ICD-10-CM | POA: Diagnosis not present

## 2016-06-01 DIAGNOSIS — Z992 Dependence on renal dialysis: Secondary | ICD-10-CM | POA: Diagnosis not present

## 2016-06-01 DIAGNOSIS — N2581 Secondary hyperparathyroidism of renal origin: Secondary | ICD-10-CM | POA: Diagnosis not present

## 2016-06-01 DIAGNOSIS — N186 End stage renal disease: Secondary | ICD-10-CM | POA: Diagnosis not present

## 2016-06-01 DIAGNOSIS — D509 Iron deficiency anemia, unspecified: Secondary | ICD-10-CM | POA: Diagnosis not present

## 2016-06-03 DIAGNOSIS — N2581 Secondary hyperparathyroidism of renal origin: Secondary | ICD-10-CM | POA: Diagnosis not present

## 2016-06-03 DIAGNOSIS — D509 Iron deficiency anemia, unspecified: Secondary | ICD-10-CM | POA: Diagnosis not present

## 2016-06-03 DIAGNOSIS — N186 End stage renal disease: Secondary | ICD-10-CM | POA: Diagnosis not present

## 2016-06-03 DIAGNOSIS — D631 Anemia in chronic kidney disease: Secondary | ICD-10-CM | POA: Diagnosis not present

## 2016-06-03 DIAGNOSIS — Z992 Dependence on renal dialysis: Secondary | ICD-10-CM | POA: Diagnosis not present

## 2016-06-03 DIAGNOSIS — E162 Hypoglycemia, unspecified: Secondary | ICD-10-CM | POA: Diagnosis not present

## 2016-06-04 DIAGNOSIS — N186 End stage renal disease: Secondary | ICD-10-CM | POA: Diagnosis not present

## 2016-06-04 DIAGNOSIS — Z992 Dependence on renal dialysis: Secondary | ICD-10-CM | POA: Diagnosis not present

## 2016-06-06 DIAGNOSIS — E11649 Type 2 diabetes mellitus with hypoglycemia without coma: Secondary | ICD-10-CM | POA: Diagnosis not present

## 2016-06-06 DIAGNOSIS — D509 Iron deficiency anemia, unspecified: Secondary | ICD-10-CM | POA: Diagnosis not present

## 2016-06-06 DIAGNOSIS — D631 Anemia in chronic kidney disease: Secondary | ICD-10-CM | POA: Diagnosis not present

## 2016-06-06 DIAGNOSIS — N2581 Secondary hyperparathyroidism of renal origin: Secondary | ICD-10-CM | POA: Diagnosis not present

## 2016-06-06 DIAGNOSIS — E162 Hypoglycemia, unspecified: Secondary | ICD-10-CM | POA: Diagnosis not present

## 2016-06-06 DIAGNOSIS — Z992 Dependence on renal dialysis: Secondary | ICD-10-CM | POA: Diagnosis not present

## 2016-06-06 DIAGNOSIS — N186 End stage renal disease: Secondary | ICD-10-CM | POA: Diagnosis not present

## 2016-06-06 DIAGNOSIS — Z794 Long term (current) use of insulin: Secondary | ICD-10-CM | POA: Diagnosis not present

## 2016-06-08 DIAGNOSIS — N2581 Secondary hyperparathyroidism of renal origin: Secondary | ICD-10-CM | POA: Diagnosis not present

## 2016-06-08 DIAGNOSIS — D509 Iron deficiency anemia, unspecified: Secondary | ICD-10-CM | POA: Diagnosis not present

## 2016-06-08 DIAGNOSIS — N186 End stage renal disease: Secondary | ICD-10-CM | POA: Diagnosis not present

## 2016-06-08 DIAGNOSIS — D631 Anemia in chronic kidney disease: Secondary | ICD-10-CM | POA: Diagnosis not present

## 2016-06-08 DIAGNOSIS — E162 Hypoglycemia, unspecified: Secondary | ICD-10-CM | POA: Diagnosis not present

## 2016-06-08 DIAGNOSIS — Z992 Dependence on renal dialysis: Secondary | ICD-10-CM | POA: Diagnosis not present

## 2016-06-10 DIAGNOSIS — D509 Iron deficiency anemia, unspecified: Secondary | ICD-10-CM | POA: Diagnosis not present

## 2016-06-10 DIAGNOSIS — N186 End stage renal disease: Secondary | ICD-10-CM | POA: Diagnosis not present

## 2016-06-10 DIAGNOSIS — Z992 Dependence on renal dialysis: Secondary | ICD-10-CM | POA: Diagnosis not present

## 2016-06-10 DIAGNOSIS — E162 Hypoglycemia, unspecified: Secondary | ICD-10-CM | POA: Diagnosis not present

## 2016-06-10 DIAGNOSIS — D631 Anemia in chronic kidney disease: Secondary | ICD-10-CM | POA: Diagnosis not present

## 2016-06-10 DIAGNOSIS — N2581 Secondary hyperparathyroidism of renal origin: Secondary | ICD-10-CM | POA: Diagnosis not present

## 2016-06-13 DIAGNOSIS — N2581 Secondary hyperparathyroidism of renal origin: Secondary | ICD-10-CM | POA: Diagnosis not present

## 2016-06-13 DIAGNOSIS — E162 Hypoglycemia, unspecified: Secondary | ICD-10-CM | POA: Diagnosis not present

## 2016-06-13 DIAGNOSIS — D631 Anemia in chronic kidney disease: Secondary | ICD-10-CM | POA: Diagnosis not present

## 2016-06-13 DIAGNOSIS — Z992 Dependence on renal dialysis: Secondary | ICD-10-CM | POA: Diagnosis not present

## 2016-06-13 DIAGNOSIS — N186 End stage renal disease: Secondary | ICD-10-CM | POA: Diagnosis not present

## 2016-06-13 DIAGNOSIS — D509 Iron deficiency anemia, unspecified: Secondary | ICD-10-CM | POA: Diagnosis not present

## 2016-06-15 DIAGNOSIS — N186 End stage renal disease: Secondary | ICD-10-CM | POA: Diagnosis not present

## 2016-06-15 DIAGNOSIS — D509 Iron deficiency anemia, unspecified: Secondary | ICD-10-CM | POA: Diagnosis not present

## 2016-06-15 DIAGNOSIS — N2581 Secondary hyperparathyroidism of renal origin: Secondary | ICD-10-CM | POA: Diagnosis not present

## 2016-06-15 DIAGNOSIS — E162 Hypoglycemia, unspecified: Secondary | ICD-10-CM | POA: Diagnosis not present

## 2016-06-15 DIAGNOSIS — D631 Anemia in chronic kidney disease: Secondary | ICD-10-CM | POA: Diagnosis not present

## 2016-06-15 DIAGNOSIS — Z992 Dependence on renal dialysis: Secondary | ICD-10-CM | POA: Diagnosis not present

## 2016-06-17 DIAGNOSIS — E162 Hypoglycemia, unspecified: Secondary | ICD-10-CM | POA: Diagnosis not present

## 2016-06-17 DIAGNOSIS — N2581 Secondary hyperparathyroidism of renal origin: Secondary | ICD-10-CM | POA: Diagnosis not present

## 2016-06-17 DIAGNOSIS — Z992 Dependence on renal dialysis: Secondary | ICD-10-CM | POA: Diagnosis not present

## 2016-06-17 DIAGNOSIS — D631 Anemia in chronic kidney disease: Secondary | ICD-10-CM | POA: Diagnosis not present

## 2016-06-17 DIAGNOSIS — D509 Iron deficiency anemia, unspecified: Secondary | ICD-10-CM | POA: Diagnosis not present

## 2016-06-17 DIAGNOSIS — N186 End stage renal disease: Secondary | ICD-10-CM | POA: Diagnosis not present

## 2016-06-20 DIAGNOSIS — E162 Hypoglycemia, unspecified: Secondary | ICD-10-CM | POA: Diagnosis not present

## 2016-06-20 DIAGNOSIS — N2581 Secondary hyperparathyroidism of renal origin: Secondary | ICD-10-CM | POA: Diagnosis not present

## 2016-06-20 DIAGNOSIS — D631 Anemia in chronic kidney disease: Secondary | ICD-10-CM | POA: Diagnosis not present

## 2016-06-20 DIAGNOSIS — D509 Iron deficiency anemia, unspecified: Secondary | ICD-10-CM | POA: Diagnosis not present

## 2016-06-20 DIAGNOSIS — Z992 Dependence on renal dialysis: Secondary | ICD-10-CM | POA: Diagnosis not present

## 2016-06-20 DIAGNOSIS — N186 End stage renal disease: Secondary | ICD-10-CM | POA: Diagnosis not present

## 2016-06-24 DIAGNOSIS — N2581 Secondary hyperparathyroidism of renal origin: Secondary | ICD-10-CM | POA: Diagnosis not present

## 2016-06-24 DIAGNOSIS — E162 Hypoglycemia, unspecified: Secondary | ICD-10-CM | POA: Diagnosis not present

## 2016-06-24 DIAGNOSIS — D631 Anemia in chronic kidney disease: Secondary | ICD-10-CM | POA: Diagnosis not present

## 2016-06-24 DIAGNOSIS — Z992 Dependence on renal dialysis: Secondary | ICD-10-CM | POA: Diagnosis not present

## 2016-06-24 DIAGNOSIS — N186 End stage renal disease: Secondary | ICD-10-CM | POA: Diagnosis not present

## 2016-06-24 DIAGNOSIS — D509 Iron deficiency anemia, unspecified: Secondary | ICD-10-CM | POA: Diagnosis not present

## 2016-06-27 DIAGNOSIS — N2581 Secondary hyperparathyroidism of renal origin: Secondary | ICD-10-CM | POA: Diagnosis not present

## 2016-06-27 DIAGNOSIS — E162 Hypoglycemia, unspecified: Secondary | ICD-10-CM | POA: Diagnosis not present

## 2016-06-27 DIAGNOSIS — D509 Iron deficiency anemia, unspecified: Secondary | ICD-10-CM | POA: Diagnosis not present

## 2016-06-27 DIAGNOSIS — D631 Anemia in chronic kidney disease: Secondary | ICD-10-CM | POA: Diagnosis not present

## 2016-06-27 DIAGNOSIS — N186 End stage renal disease: Secondary | ICD-10-CM | POA: Diagnosis not present

## 2016-06-27 DIAGNOSIS — Z992 Dependence on renal dialysis: Secondary | ICD-10-CM | POA: Diagnosis not present

## 2016-06-29 DIAGNOSIS — Z992 Dependence on renal dialysis: Secondary | ICD-10-CM | POA: Diagnosis not present

## 2016-06-29 DIAGNOSIS — E162 Hypoglycemia, unspecified: Secondary | ICD-10-CM | POA: Diagnosis not present

## 2016-06-29 DIAGNOSIS — D509 Iron deficiency anemia, unspecified: Secondary | ICD-10-CM | POA: Diagnosis not present

## 2016-06-29 DIAGNOSIS — D631 Anemia in chronic kidney disease: Secondary | ICD-10-CM | POA: Diagnosis not present

## 2016-06-29 DIAGNOSIS — N2581 Secondary hyperparathyroidism of renal origin: Secondary | ICD-10-CM | POA: Diagnosis not present

## 2016-06-29 DIAGNOSIS — N186 End stage renal disease: Secondary | ICD-10-CM | POA: Diagnosis not present

## 2016-07-04 DIAGNOSIS — E162 Hypoglycemia, unspecified: Secondary | ICD-10-CM | POA: Diagnosis not present

## 2016-07-04 DIAGNOSIS — N2581 Secondary hyperparathyroidism of renal origin: Secondary | ICD-10-CM | POA: Diagnosis not present

## 2016-07-04 DIAGNOSIS — N186 End stage renal disease: Secondary | ICD-10-CM | POA: Diagnosis not present

## 2016-07-04 DIAGNOSIS — D631 Anemia in chronic kidney disease: Secondary | ICD-10-CM | POA: Diagnosis not present

## 2016-07-04 DIAGNOSIS — D509 Iron deficiency anemia, unspecified: Secondary | ICD-10-CM | POA: Diagnosis not present

## 2016-07-04 DIAGNOSIS — Z992 Dependence on renal dialysis: Secondary | ICD-10-CM | POA: Diagnosis not present

## 2016-07-05 DIAGNOSIS — N186 End stage renal disease: Secondary | ICD-10-CM | POA: Diagnosis not present

## 2016-07-05 DIAGNOSIS — Z992 Dependence on renal dialysis: Secondary | ICD-10-CM | POA: Diagnosis not present

## 2016-07-06 DIAGNOSIS — N186 End stage renal disease: Secondary | ICD-10-CM | POA: Diagnosis not present

## 2016-07-06 DIAGNOSIS — N2581 Secondary hyperparathyroidism of renal origin: Secondary | ICD-10-CM | POA: Diagnosis not present

## 2016-07-06 DIAGNOSIS — Z992 Dependence on renal dialysis: Secondary | ICD-10-CM | POA: Diagnosis not present

## 2016-07-06 DIAGNOSIS — Z794 Long term (current) use of insulin: Secondary | ICD-10-CM | POA: Diagnosis not present

## 2016-07-06 DIAGNOSIS — E11649 Type 2 diabetes mellitus with hypoglycemia without coma: Secondary | ICD-10-CM | POA: Diagnosis not present

## 2016-07-06 DIAGNOSIS — D631 Anemia in chronic kidney disease: Secondary | ICD-10-CM | POA: Diagnosis not present

## 2016-07-06 DIAGNOSIS — E162 Hypoglycemia, unspecified: Secondary | ICD-10-CM | POA: Diagnosis not present

## 2016-07-06 DIAGNOSIS — D509 Iron deficiency anemia, unspecified: Secondary | ICD-10-CM | POA: Diagnosis not present

## 2016-07-08 DIAGNOSIS — E162 Hypoglycemia, unspecified: Secondary | ICD-10-CM | POA: Diagnosis not present

## 2016-07-08 DIAGNOSIS — Z992 Dependence on renal dialysis: Secondary | ICD-10-CM | POA: Diagnosis not present

## 2016-07-08 DIAGNOSIS — D631 Anemia in chronic kidney disease: Secondary | ICD-10-CM | POA: Diagnosis not present

## 2016-07-08 DIAGNOSIS — N186 End stage renal disease: Secondary | ICD-10-CM | POA: Diagnosis not present

## 2016-07-08 DIAGNOSIS — N2581 Secondary hyperparathyroidism of renal origin: Secondary | ICD-10-CM | POA: Diagnosis not present

## 2016-07-08 DIAGNOSIS — D509 Iron deficiency anemia, unspecified: Secondary | ICD-10-CM | POA: Diagnosis not present

## 2016-07-11 DIAGNOSIS — N186 End stage renal disease: Secondary | ICD-10-CM | POA: Diagnosis not present

## 2016-07-11 DIAGNOSIS — Z992 Dependence on renal dialysis: Secondary | ICD-10-CM | POA: Diagnosis not present

## 2016-07-11 DIAGNOSIS — D509 Iron deficiency anemia, unspecified: Secondary | ICD-10-CM | POA: Diagnosis not present

## 2016-07-11 DIAGNOSIS — D631 Anemia in chronic kidney disease: Secondary | ICD-10-CM | POA: Diagnosis not present

## 2016-07-11 DIAGNOSIS — N2581 Secondary hyperparathyroidism of renal origin: Secondary | ICD-10-CM | POA: Diagnosis not present

## 2016-07-11 DIAGNOSIS — E162 Hypoglycemia, unspecified: Secondary | ICD-10-CM | POA: Diagnosis not present

## 2016-07-13 DIAGNOSIS — N2581 Secondary hyperparathyroidism of renal origin: Secondary | ICD-10-CM | POA: Diagnosis not present

## 2016-07-13 DIAGNOSIS — D631 Anemia in chronic kidney disease: Secondary | ICD-10-CM | POA: Diagnosis not present

## 2016-07-13 DIAGNOSIS — E162 Hypoglycemia, unspecified: Secondary | ICD-10-CM | POA: Diagnosis not present

## 2016-07-13 DIAGNOSIS — N186 End stage renal disease: Secondary | ICD-10-CM | POA: Diagnosis not present

## 2016-07-13 DIAGNOSIS — Z992 Dependence on renal dialysis: Secondary | ICD-10-CM | POA: Diagnosis not present

## 2016-07-13 DIAGNOSIS — D509 Iron deficiency anemia, unspecified: Secondary | ICD-10-CM | POA: Diagnosis not present

## 2016-07-15 DIAGNOSIS — D631 Anemia in chronic kidney disease: Secondary | ICD-10-CM | POA: Diagnosis not present

## 2016-07-15 DIAGNOSIS — E162 Hypoglycemia, unspecified: Secondary | ICD-10-CM | POA: Diagnosis not present

## 2016-07-15 DIAGNOSIS — N2581 Secondary hyperparathyroidism of renal origin: Secondary | ICD-10-CM | POA: Diagnosis not present

## 2016-07-15 DIAGNOSIS — D509 Iron deficiency anemia, unspecified: Secondary | ICD-10-CM | POA: Diagnosis not present

## 2016-07-15 DIAGNOSIS — N186 End stage renal disease: Secondary | ICD-10-CM | POA: Diagnosis not present

## 2016-07-15 DIAGNOSIS — Z992 Dependence on renal dialysis: Secondary | ICD-10-CM | POA: Diagnosis not present

## 2016-07-18 DIAGNOSIS — D631 Anemia in chronic kidney disease: Secondary | ICD-10-CM | POA: Diagnosis not present

## 2016-07-18 DIAGNOSIS — Z992 Dependence on renal dialysis: Secondary | ICD-10-CM | POA: Diagnosis not present

## 2016-07-18 DIAGNOSIS — E162 Hypoglycemia, unspecified: Secondary | ICD-10-CM | POA: Diagnosis not present

## 2016-07-18 DIAGNOSIS — N186 End stage renal disease: Secondary | ICD-10-CM | POA: Diagnosis not present

## 2016-07-18 DIAGNOSIS — N2581 Secondary hyperparathyroidism of renal origin: Secondary | ICD-10-CM | POA: Diagnosis not present

## 2016-07-18 DIAGNOSIS — D509 Iron deficiency anemia, unspecified: Secondary | ICD-10-CM | POA: Diagnosis not present

## 2016-07-20 DIAGNOSIS — N2581 Secondary hyperparathyroidism of renal origin: Secondary | ICD-10-CM | POA: Diagnosis not present

## 2016-07-20 DIAGNOSIS — E162 Hypoglycemia, unspecified: Secondary | ICD-10-CM | POA: Diagnosis not present

## 2016-07-20 DIAGNOSIS — D509 Iron deficiency anemia, unspecified: Secondary | ICD-10-CM | POA: Diagnosis not present

## 2016-07-20 DIAGNOSIS — D631 Anemia in chronic kidney disease: Secondary | ICD-10-CM | POA: Diagnosis not present

## 2016-07-20 DIAGNOSIS — Z992 Dependence on renal dialysis: Secondary | ICD-10-CM | POA: Diagnosis not present

## 2016-07-20 DIAGNOSIS — N186 End stage renal disease: Secondary | ICD-10-CM | POA: Diagnosis not present

## 2016-07-22 DIAGNOSIS — E162 Hypoglycemia, unspecified: Secondary | ICD-10-CM | POA: Diagnosis not present

## 2016-07-22 DIAGNOSIS — D631 Anemia in chronic kidney disease: Secondary | ICD-10-CM | POA: Diagnosis not present

## 2016-07-22 DIAGNOSIS — D509 Iron deficiency anemia, unspecified: Secondary | ICD-10-CM | POA: Diagnosis not present

## 2016-07-22 DIAGNOSIS — N2581 Secondary hyperparathyroidism of renal origin: Secondary | ICD-10-CM | POA: Diagnosis not present

## 2016-07-22 DIAGNOSIS — Z992 Dependence on renal dialysis: Secondary | ICD-10-CM | POA: Diagnosis not present

## 2016-07-22 DIAGNOSIS — N186 End stage renal disease: Secondary | ICD-10-CM | POA: Diagnosis not present

## 2016-07-25 DIAGNOSIS — N2581 Secondary hyperparathyroidism of renal origin: Secondary | ICD-10-CM | POA: Diagnosis not present

## 2016-07-25 DIAGNOSIS — Z992 Dependence on renal dialysis: Secondary | ICD-10-CM | POA: Diagnosis not present

## 2016-07-25 DIAGNOSIS — D631 Anemia in chronic kidney disease: Secondary | ICD-10-CM | POA: Diagnosis not present

## 2016-07-25 DIAGNOSIS — D509 Iron deficiency anemia, unspecified: Secondary | ICD-10-CM | POA: Diagnosis not present

## 2016-07-25 DIAGNOSIS — N186 End stage renal disease: Secondary | ICD-10-CM | POA: Diagnosis not present

## 2016-07-25 DIAGNOSIS — E162 Hypoglycemia, unspecified: Secondary | ICD-10-CM | POA: Diagnosis not present

## 2016-07-27 DIAGNOSIS — N2581 Secondary hyperparathyroidism of renal origin: Secondary | ICD-10-CM | POA: Diagnosis not present

## 2016-07-27 DIAGNOSIS — E162 Hypoglycemia, unspecified: Secondary | ICD-10-CM | POA: Diagnosis not present

## 2016-07-27 DIAGNOSIS — N186 End stage renal disease: Secondary | ICD-10-CM | POA: Diagnosis not present

## 2016-07-27 DIAGNOSIS — Z992 Dependence on renal dialysis: Secondary | ICD-10-CM | POA: Diagnosis not present

## 2016-07-27 DIAGNOSIS — D631 Anemia in chronic kidney disease: Secondary | ICD-10-CM | POA: Diagnosis not present

## 2016-07-27 DIAGNOSIS — D509 Iron deficiency anemia, unspecified: Secondary | ICD-10-CM | POA: Diagnosis not present

## 2016-07-29 DIAGNOSIS — N2581 Secondary hyperparathyroidism of renal origin: Secondary | ICD-10-CM | POA: Diagnosis not present

## 2016-07-29 DIAGNOSIS — Z992 Dependence on renal dialysis: Secondary | ICD-10-CM | POA: Diagnosis not present

## 2016-07-29 DIAGNOSIS — E162 Hypoglycemia, unspecified: Secondary | ICD-10-CM | POA: Diagnosis not present

## 2016-07-29 DIAGNOSIS — N186 End stage renal disease: Secondary | ICD-10-CM | POA: Diagnosis not present

## 2016-07-29 DIAGNOSIS — D631 Anemia in chronic kidney disease: Secondary | ICD-10-CM | POA: Diagnosis not present

## 2016-07-29 DIAGNOSIS — D509 Iron deficiency anemia, unspecified: Secondary | ICD-10-CM | POA: Diagnosis not present

## 2016-08-01 DIAGNOSIS — N2581 Secondary hyperparathyroidism of renal origin: Secondary | ICD-10-CM | POA: Diagnosis not present

## 2016-08-01 DIAGNOSIS — E162 Hypoglycemia, unspecified: Secondary | ICD-10-CM | POA: Diagnosis not present

## 2016-08-01 DIAGNOSIS — D509 Iron deficiency anemia, unspecified: Secondary | ICD-10-CM | POA: Diagnosis not present

## 2016-08-01 DIAGNOSIS — Z992 Dependence on renal dialysis: Secondary | ICD-10-CM | POA: Diagnosis not present

## 2016-08-01 DIAGNOSIS — N186 End stage renal disease: Secondary | ICD-10-CM | POA: Diagnosis not present

## 2016-08-01 DIAGNOSIS — D631 Anemia in chronic kidney disease: Secondary | ICD-10-CM | POA: Diagnosis not present

## 2016-08-02 DIAGNOSIS — Z992 Dependence on renal dialysis: Secondary | ICD-10-CM | POA: Diagnosis not present

## 2016-08-02 DIAGNOSIS — N186 End stage renal disease: Secondary | ICD-10-CM | POA: Diagnosis not present

## 2016-08-05 DIAGNOSIS — D509 Iron deficiency anemia, unspecified: Secondary | ICD-10-CM | POA: Diagnosis not present

## 2016-08-05 DIAGNOSIS — N186 End stage renal disease: Secondary | ICD-10-CM | POA: Diagnosis not present

## 2016-08-05 DIAGNOSIS — D631 Anemia in chronic kidney disease: Secondary | ICD-10-CM | POA: Diagnosis not present

## 2016-08-05 DIAGNOSIS — Z992 Dependence on renal dialysis: Secondary | ICD-10-CM | POA: Diagnosis not present

## 2016-08-05 DIAGNOSIS — Z794 Long term (current) use of insulin: Secondary | ICD-10-CM | POA: Diagnosis not present

## 2016-08-05 DIAGNOSIS — E11649 Type 2 diabetes mellitus with hypoglycemia without coma: Secondary | ICD-10-CM | POA: Diagnosis not present

## 2016-08-05 DIAGNOSIS — N2581 Secondary hyperparathyroidism of renal origin: Secondary | ICD-10-CM | POA: Diagnosis not present

## 2016-08-05 DIAGNOSIS — E162 Hypoglycemia, unspecified: Secondary | ICD-10-CM | POA: Diagnosis not present

## 2016-08-08 DIAGNOSIS — N186 End stage renal disease: Secondary | ICD-10-CM | POA: Diagnosis not present

## 2016-08-08 DIAGNOSIS — Z992 Dependence on renal dialysis: Secondary | ICD-10-CM | POA: Diagnosis not present

## 2016-08-08 DIAGNOSIS — E162 Hypoglycemia, unspecified: Secondary | ICD-10-CM | POA: Diagnosis not present

## 2016-08-08 DIAGNOSIS — N2581 Secondary hyperparathyroidism of renal origin: Secondary | ICD-10-CM | POA: Diagnosis not present

## 2016-08-08 DIAGNOSIS — D509 Iron deficiency anemia, unspecified: Secondary | ICD-10-CM | POA: Diagnosis not present

## 2016-08-08 DIAGNOSIS — D631 Anemia in chronic kidney disease: Secondary | ICD-10-CM | POA: Diagnosis not present

## 2016-08-10 DIAGNOSIS — Z992 Dependence on renal dialysis: Secondary | ICD-10-CM | POA: Diagnosis not present

## 2016-08-10 DIAGNOSIS — E162 Hypoglycemia, unspecified: Secondary | ICD-10-CM | POA: Diagnosis not present

## 2016-08-10 DIAGNOSIS — D509 Iron deficiency anemia, unspecified: Secondary | ICD-10-CM | POA: Diagnosis not present

## 2016-08-10 DIAGNOSIS — N186 End stage renal disease: Secondary | ICD-10-CM | POA: Diagnosis not present

## 2016-08-10 DIAGNOSIS — N2581 Secondary hyperparathyroidism of renal origin: Secondary | ICD-10-CM | POA: Diagnosis not present

## 2016-08-10 DIAGNOSIS — D631 Anemia in chronic kidney disease: Secondary | ICD-10-CM | POA: Diagnosis not present

## 2016-08-12 DIAGNOSIS — E162 Hypoglycemia, unspecified: Secondary | ICD-10-CM | POA: Diagnosis not present

## 2016-08-12 DIAGNOSIS — N2581 Secondary hyperparathyroidism of renal origin: Secondary | ICD-10-CM | POA: Diagnosis not present

## 2016-08-12 DIAGNOSIS — D631 Anemia in chronic kidney disease: Secondary | ICD-10-CM | POA: Diagnosis not present

## 2016-08-12 DIAGNOSIS — N186 End stage renal disease: Secondary | ICD-10-CM | POA: Diagnosis not present

## 2016-08-12 DIAGNOSIS — Z992 Dependence on renal dialysis: Secondary | ICD-10-CM | POA: Diagnosis not present

## 2016-08-12 DIAGNOSIS — D509 Iron deficiency anemia, unspecified: Secondary | ICD-10-CM | POA: Diagnosis not present

## 2016-08-16 DIAGNOSIS — Z992 Dependence on renal dialysis: Secondary | ICD-10-CM | POA: Diagnosis not present

## 2016-08-16 DIAGNOSIS — D509 Iron deficiency anemia, unspecified: Secondary | ICD-10-CM | POA: Diagnosis not present

## 2016-08-16 DIAGNOSIS — E162 Hypoglycemia, unspecified: Secondary | ICD-10-CM | POA: Diagnosis not present

## 2016-08-16 DIAGNOSIS — D631 Anemia in chronic kidney disease: Secondary | ICD-10-CM | POA: Diagnosis not present

## 2016-08-16 DIAGNOSIS — N2581 Secondary hyperparathyroidism of renal origin: Secondary | ICD-10-CM | POA: Diagnosis not present

## 2016-08-16 DIAGNOSIS — N186 End stage renal disease: Secondary | ICD-10-CM | POA: Diagnosis not present

## 2016-08-17 DIAGNOSIS — D631 Anemia in chronic kidney disease: Secondary | ICD-10-CM | POA: Diagnosis not present

## 2016-08-17 DIAGNOSIS — D509 Iron deficiency anemia, unspecified: Secondary | ICD-10-CM | POA: Diagnosis not present

## 2016-08-17 DIAGNOSIS — N2581 Secondary hyperparathyroidism of renal origin: Secondary | ICD-10-CM | POA: Diagnosis not present

## 2016-08-17 DIAGNOSIS — Z992 Dependence on renal dialysis: Secondary | ICD-10-CM | POA: Diagnosis not present

## 2016-08-17 DIAGNOSIS — N186 End stage renal disease: Secondary | ICD-10-CM | POA: Diagnosis not present

## 2016-08-17 DIAGNOSIS — E162 Hypoglycemia, unspecified: Secondary | ICD-10-CM | POA: Diagnosis not present

## 2016-08-19 DIAGNOSIS — N186 End stage renal disease: Secondary | ICD-10-CM | POA: Diagnosis not present

## 2016-08-19 DIAGNOSIS — Z992 Dependence on renal dialysis: Secondary | ICD-10-CM | POA: Diagnosis not present

## 2016-08-19 DIAGNOSIS — N2581 Secondary hyperparathyroidism of renal origin: Secondary | ICD-10-CM | POA: Diagnosis not present

## 2016-08-19 DIAGNOSIS — E162 Hypoglycemia, unspecified: Secondary | ICD-10-CM | POA: Diagnosis not present

## 2016-08-19 DIAGNOSIS — D509 Iron deficiency anemia, unspecified: Secondary | ICD-10-CM | POA: Diagnosis not present

## 2016-08-19 DIAGNOSIS — D631 Anemia in chronic kidney disease: Secondary | ICD-10-CM | POA: Diagnosis not present

## 2016-08-22 DIAGNOSIS — N2581 Secondary hyperparathyroidism of renal origin: Secondary | ICD-10-CM | POA: Diagnosis not present

## 2016-08-22 DIAGNOSIS — Z992 Dependence on renal dialysis: Secondary | ICD-10-CM | POA: Diagnosis not present

## 2016-08-22 DIAGNOSIS — D631 Anemia in chronic kidney disease: Secondary | ICD-10-CM | POA: Diagnosis not present

## 2016-08-22 DIAGNOSIS — N186 End stage renal disease: Secondary | ICD-10-CM | POA: Diagnosis not present

## 2016-08-22 DIAGNOSIS — E162 Hypoglycemia, unspecified: Secondary | ICD-10-CM | POA: Diagnosis not present

## 2016-08-22 DIAGNOSIS — D509 Iron deficiency anemia, unspecified: Secondary | ICD-10-CM | POA: Diagnosis not present

## 2016-08-24 DIAGNOSIS — E162 Hypoglycemia, unspecified: Secondary | ICD-10-CM | POA: Diagnosis not present

## 2016-08-24 DIAGNOSIS — D509 Iron deficiency anemia, unspecified: Secondary | ICD-10-CM | POA: Diagnosis not present

## 2016-08-24 DIAGNOSIS — N186 End stage renal disease: Secondary | ICD-10-CM | POA: Diagnosis not present

## 2016-08-24 DIAGNOSIS — N2581 Secondary hyperparathyroidism of renal origin: Secondary | ICD-10-CM | POA: Diagnosis not present

## 2016-08-24 DIAGNOSIS — D631 Anemia in chronic kidney disease: Secondary | ICD-10-CM | POA: Diagnosis not present

## 2016-08-24 DIAGNOSIS — Z992 Dependence on renal dialysis: Secondary | ICD-10-CM | POA: Diagnosis not present

## 2016-08-26 DIAGNOSIS — Z992 Dependence on renal dialysis: Secondary | ICD-10-CM | POA: Diagnosis not present

## 2016-08-26 DIAGNOSIS — D509 Iron deficiency anemia, unspecified: Secondary | ICD-10-CM | POA: Diagnosis not present

## 2016-08-26 DIAGNOSIS — N2581 Secondary hyperparathyroidism of renal origin: Secondary | ICD-10-CM | POA: Diagnosis not present

## 2016-08-26 DIAGNOSIS — D631 Anemia in chronic kidney disease: Secondary | ICD-10-CM | POA: Diagnosis not present

## 2016-08-26 DIAGNOSIS — N186 End stage renal disease: Secondary | ICD-10-CM | POA: Diagnosis not present

## 2016-08-26 DIAGNOSIS — E162 Hypoglycemia, unspecified: Secondary | ICD-10-CM | POA: Diagnosis not present

## 2016-08-29 DIAGNOSIS — Z992 Dependence on renal dialysis: Secondary | ICD-10-CM | POA: Diagnosis not present

## 2016-08-29 DIAGNOSIS — Z794 Long term (current) use of insulin: Secondary | ICD-10-CM | POA: Diagnosis not present

## 2016-08-29 DIAGNOSIS — N186 End stage renal disease: Secondary | ICD-10-CM | POA: Diagnosis not present

## 2016-08-29 DIAGNOSIS — D509 Iron deficiency anemia, unspecified: Secondary | ICD-10-CM | POA: Diagnosis not present

## 2016-08-29 DIAGNOSIS — Z79899 Other long term (current) drug therapy: Secondary | ICD-10-CM | POA: Diagnosis not present

## 2016-08-29 DIAGNOSIS — E785 Hyperlipidemia, unspecified: Secondary | ICD-10-CM | POA: Diagnosis not present

## 2016-08-29 DIAGNOSIS — N2581 Secondary hyperparathyroidism of renal origin: Secondary | ICD-10-CM | POA: Diagnosis not present

## 2016-08-29 DIAGNOSIS — D631 Anemia in chronic kidney disease: Secondary | ICD-10-CM | POA: Diagnosis not present

## 2016-08-29 DIAGNOSIS — E162 Hypoglycemia, unspecified: Secondary | ICD-10-CM | POA: Diagnosis not present

## 2016-08-29 DIAGNOSIS — E119 Type 2 diabetes mellitus without complications: Secondary | ICD-10-CM | POA: Diagnosis not present

## 2016-08-31 DIAGNOSIS — N2581 Secondary hyperparathyroidism of renal origin: Secondary | ICD-10-CM | POA: Diagnosis not present

## 2016-08-31 DIAGNOSIS — E162 Hypoglycemia, unspecified: Secondary | ICD-10-CM | POA: Diagnosis not present

## 2016-08-31 DIAGNOSIS — N186 End stage renal disease: Secondary | ICD-10-CM | POA: Diagnosis not present

## 2016-08-31 DIAGNOSIS — D509 Iron deficiency anemia, unspecified: Secondary | ICD-10-CM | POA: Diagnosis not present

## 2016-08-31 DIAGNOSIS — D631 Anemia in chronic kidney disease: Secondary | ICD-10-CM | POA: Diagnosis not present

## 2016-08-31 DIAGNOSIS — Z992 Dependence on renal dialysis: Secondary | ICD-10-CM | POA: Diagnosis not present

## 2016-09-02 DIAGNOSIS — N186 End stage renal disease: Secondary | ICD-10-CM | POA: Diagnosis not present

## 2016-09-02 DIAGNOSIS — Z992 Dependence on renal dialysis: Secondary | ICD-10-CM | POA: Diagnosis not present

## 2016-09-02 DIAGNOSIS — D631 Anemia in chronic kidney disease: Secondary | ICD-10-CM | POA: Diagnosis not present

## 2016-09-02 DIAGNOSIS — E162 Hypoglycemia, unspecified: Secondary | ICD-10-CM | POA: Diagnosis not present

## 2016-09-02 DIAGNOSIS — N2581 Secondary hyperparathyroidism of renal origin: Secondary | ICD-10-CM | POA: Diagnosis not present

## 2016-09-02 DIAGNOSIS — D509 Iron deficiency anemia, unspecified: Secondary | ICD-10-CM | POA: Diagnosis not present

## 2016-09-05 DIAGNOSIS — N2581 Secondary hyperparathyroidism of renal origin: Secondary | ICD-10-CM | POA: Diagnosis not present

## 2016-09-05 DIAGNOSIS — D509 Iron deficiency anemia, unspecified: Secondary | ICD-10-CM | POA: Diagnosis not present

## 2016-09-05 DIAGNOSIS — Z992 Dependence on renal dialysis: Secondary | ICD-10-CM | POA: Diagnosis not present

## 2016-09-05 DIAGNOSIS — N186 End stage renal disease: Secondary | ICD-10-CM | POA: Diagnosis not present

## 2016-09-05 DIAGNOSIS — D631 Anemia in chronic kidney disease: Secondary | ICD-10-CM | POA: Diagnosis not present

## 2016-09-05 DIAGNOSIS — Z794 Long term (current) use of insulin: Secondary | ICD-10-CM | POA: Diagnosis not present

## 2016-09-05 DIAGNOSIS — E162 Hypoglycemia, unspecified: Secondary | ICD-10-CM | POA: Diagnosis not present

## 2016-09-05 DIAGNOSIS — E11649 Type 2 diabetes mellitus with hypoglycemia without coma: Secondary | ICD-10-CM | POA: Diagnosis not present

## 2016-09-07 DIAGNOSIS — N186 End stage renal disease: Secondary | ICD-10-CM | POA: Diagnosis not present

## 2016-09-07 DIAGNOSIS — D631 Anemia in chronic kidney disease: Secondary | ICD-10-CM | POA: Diagnosis not present

## 2016-09-07 DIAGNOSIS — E162 Hypoglycemia, unspecified: Secondary | ICD-10-CM | POA: Diagnosis not present

## 2016-09-07 DIAGNOSIS — D509 Iron deficiency anemia, unspecified: Secondary | ICD-10-CM | POA: Diagnosis not present

## 2016-09-07 DIAGNOSIS — N2581 Secondary hyperparathyroidism of renal origin: Secondary | ICD-10-CM | POA: Diagnosis not present

## 2016-09-07 DIAGNOSIS — Z992 Dependence on renal dialysis: Secondary | ICD-10-CM | POA: Diagnosis not present

## 2016-09-09 DIAGNOSIS — D631 Anemia in chronic kidney disease: Secondary | ICD-10-CM | POA: Diagnosis not present

## 2016-09-09 DIAGNOSIS — Z992 Dependence on renal dialysis: Secondary | ICD-10-CM | POA: Diagnosis not present

## 2016-09-09 DIAGNOSIS — N2581 Secondary hyperparathyroidism of renal origin: Secondary | ICD-10-CM | POA: Diagnosis not present

## 2016-09-09 DIAGNOSIS — D509 Iron deficiency anemia, unspecified: Secondary | ICD-10-CM | POA: Diagnosis not present

## 2016-09-09 DIAGNOSIS — N186 End stage renal disease: Secondary | ICD-10-CM | POA: Diagnosis not present

## 2016-09-09 DIAGNOSIS — E162 Hypoglycemia, unspecified: Secondary | ICD-10-CM | POA: Diagnosis not present

## 2016-09-12 DIAGNOSIS — N2581 Secondary hyperparathyroidism of renal origin: Secondary | ICD-10-CM | POA: Diagnosis not present

## 2016-09-12 DIAGNOSIS — E162 Hypoglycemia, unspecified: Secondary | ICD-10-CM | POA: Diagnosis not present

## 2016-09-12 DIAGNOSIS — N186 End stage renal disease: Secondary | ICD-10-CM | POA: Diagnosis not present

## 2016-09-12 DIAGNOSIS — Z992 Dependence on renal dialysis: Secondary | ICD-10-CM | POA: Diagnosis not present

## 2016-09-12 DIAGNOSIS — D509 Iron deficiency anemia, unspecified: Secondary | ICD-10-CM | POA: Diagnosis not present

## 2016-09-12 DIAGNOSIS — D631 Anemia in chronic kidney disease: Secondary | ICD-10-CM | POA: Diagnosis not present

## 2016-09-14 DIAGNOSIS — D509 Iron deficiency anemia, unspecified: Secondary | ICD-10-CM | POA: Diagnosis not present

## 2016-09-14 DIAGNOSIS — N2581 Secondary hyperparathyroidism of renal origin: Secondary | ICD-10-CM | POA: Diagnosis not present

## 2016-09-14 DIAGNOSIS — D631 Anemia in chronic kidney disease: Secondary | ICD-10-CM | POA: Diagnosis not present

## 2016-09-14 DIAGNOSIS — N186 End stage renal disease: Secondary | ICD-10-CM | POA: Diagnosis not present

## 2016-09-14 DIAGNOSIS — E162 Hypoglycemia, unspecified: Secondary | ICD-10-CM | POA: Diagnosis not present

## 2016-09-14 DIAGNOSIS — Z992 Dependence on renal dialysis: Secondary | ICD-10-CM | POA: Diagnosis not present

## 2016-09-16 DIAGNOSIS — D631 Anemia in chronic kidney disease: Secondary | ICD-10-CM | POA: Diagnosis not present

## 2016-09-16 DIAGNOSIS — N186 End stage renal disease: Secondary | ICD-10-CM | POA: Diagnosis not present

## 2016-09-16 DIAGNOSIS — E162 Hypoglycemia, unspecified: Secondary | ICD-10-CM | POA: Diagnosis not present

## 2016-09-16 DIAGNOSIS — D509 Iron deficiency anemia, unspecified: Secondary | ICD-10-CM | POA: Diagnosis not present

## 2016-09-16 DIAGNOSIS — N2581 Secondary hyperparathyroidism of renal origin: Secondary | ICD-10-CM | POA: Diagnosis not present

## 2016-09-16 DIAGNOSIS — Z992 Dependence on renal dialysis: Secondary | ICD-10-CM | POA: Diagnosis not present

## 2016-09-19 DIAGNOSIS — D509 Iron deficiency anemia, unspecified: Secondary | ICD-10-CM | POA: Diagnosis not present

## 2016-09-19 DIAGNOSIS — N2581 Secondary hyperparathyroidism of renal origin: Secondary | ICD-10-CM | POA: Diagnosis not present

## 2016-09-19 DIAGNOSIS — Z992 Dependence on renal dialysis: Secondary | ICD-10-CM | POA: Diagnosis not present

## 2016-09-19 DIAGNOSIS — D631 Anemia in chronic kidney disease: Secondary | ICD-10-CM | POA: Diagnosis not present

## 2016-09-19 DIAGNOSIS — N186 End stage renal disease: Secondary | ICD-10-CM | POA: Diagnosis not present

## 2016-09-19 DIAGNOSIS — E162 Hypoglycemia, unspecified: Secondary | ICD-10-CM | POA: Diagnosis not present

## 2016-09-21 DIAGNOSIS — Z992 Dependence on renal dialysis: Secondary | ICD-10-CM | POA: Diagnosis not present

## 2016-09-21 DIAGNOSIS — E162 Hypoglycemia, unspecified: Secondary | ICD-10-CM | POA: Diagnosis not present

## 2016-09-21 DIAGNOSIS — D509 Iron deficiency anemia, unspecified: Secondary | ICD-10-CM | POA: Diagnosis not present

## 2016-09-21 DIAGNOSIS — N186 End stage renal disease: Secondary | ICD-10-CM | POA: Diagnosis not present

## 2016-09-21 DIAGNOSIS — N2581 Secondary hyperparathyroidism of renal origin: Secondary | ICD-10-CM | POA: Diagnosis not present

## 2016-09-21 DIAGNOSIS — D631 Anemia in chronic kidney disease: Secondary | ICD-10-CM | POA: Diagnosis not present

## 2016-09-23 DIAGNOSIS — N2581 Secondary hyperparathyroidism of renal origin: Secondary | ICD-10-CM | POA: Diagnosis not present

## 2016-09-23 DIAGNOSIS — D631 Anemia in chronic kidney disease: Secondary | ICD-10-CM | POA: Diagnosis not present

## 2016-09-23 DIAGNOSIS — Z992 Dependence on renal dialysis: Secondary | ICD-10-CM | POA: Diagnosis not present

## 2016-09-23 DIAGNOSIS — E162 Hypoglycemia, unspecified: Secondary | ICD-10-CM | POA: Diagnosis not present

## 2016-09-23 DIAGNOSIS — N186 End stage renal disease: Secondary | ICD-10-CM | POA: Diagnosis not present

## 2016-09-23 DIAGNOSIS — D509 Iron deficiency anemia, unspecified: Secondary | ICD-10-CM | POA: Diagnosis not present

## 2016-09-26 DIAGNOSIS — N186 End stage renal disease: Secondary | ICD-10-CM | POA: Diagnosis not present

## 2016-09-26 DIAGNOSIS — Z992 Dependence on renal dialysis: Secondary | ICD-10-CM | POA: Diagnosis not present

## 2016-09-26 DIAGNOSIS — N2581 Secondary hyperparathyroidism of renal origin: Secondary | ICD-10-CM | POA: Diagnosis not present

## 2016-09-26 DIAGNOSIS — E162 Hypoglycemia, unspecified: Secondary | ICD-10-CM | POA: Diagnosis not present

## 2016-09-26 DIAGNOSIS — D631 Anemia in chronic kidney disease: Secondary | ICD-10-CM | POA: Diagnosis not present

## 2016-09-26 DIAGNOSIS — D509 Iron deficiency anemia, unspecified: Secondary | ICD-10-CM | POA: Diagnosis not present

## 2016-09-28 DIAGNOSIS — D631 Anemia in chronic kidney disease: Secondary | ICD-10-CM | POA: Diagnosis not present

## 2016-09-28 DIAGNOSIS — N186 End stage renal disease: Secondary | ICD-10-CM | POA: Diagnosis not present

## 2016-09-28 DIAGNOSIS — D509 Iron deficiency anemia, unspecified: Secondary | ICD-10-CM | POA: Diagnosis not present

## 2016-09-28 DIAGNOSIS — N2581 Secondary hyperparathyroidism of renal origin: Secondary | ICD-10-CM | POA: Diagnosis not present

## 2016-09-28 DIAGNOSIS — Z992 Dependence on renal dialysis: Secondary | ICD-10-CM | POA: Diagnosis not present

## 2016-09-28 DIAGNOSIS — E162 Hypoglycemia, unspecified: Secondary | ICD-10-CM | POA: Diagnosis not present

## 2016-09-30 DIAGNOSIS — D509 Iron deficiency anemia, unspecified: Secondary | ICD-10-CM | POA: Diagnosis not present

## 2016-09-30 DIAGNOSIS — D631 Anemia in chronic kidney disease: Secondary | ICD-10-CM | POA: Diagnosis not present

## 2016-09-30 DIAGNOSIS — N186 End stage renal disease: Secondary | ICD-10-CM | POA: Diagnosis not present

## 2016-09-30 DIAGNOSIS — N2581 Secondary hyperparathyroidism of renal origin: Secondary | ICD-10-CM | POA: Diagnosis not present

## 2016-09-30 DIAGNOSIS — Z992 Dependence on renal dialysis: Secondary | ICD-10-CM | POA: Diagnosis not present

## 2016-09-30 DIAGNOSIS — E162 Hypoglycemia, unspecified: Secondary | ICD-10-CM | POA: Diagnosis not present

## 2016-10-02 DIAGNOSIS — N186 End stage renal disease: Secondary | ICD-10-CM | POA: Diagnosis not present

## 2016-10-02 DIAGNOSIS — Z992 Dependence on renal dialysis: Secondary | ICD-10-CM | POA: Diagnosis not present

## 2016-10-03 DIAGNOSIS — Z992 Dependence on renal dialysis: Secondary | ICD-10-CM | POA: Diagnosis not present

## 2016-10-03 DIAGNOSIS — N2581 Secondary hyperparathyroidism of renal origin: Secondary | ICD-10-CM | POA: Diagnosis not present

## 2016-10-03 DIAGNOSIS — N186 End stage renal disease: Secondary | ICD-10-CM | POA: Diagnosis not present

## 2016-10-03 DIAGNOSIS — D631 Anemia in chronic kidney disease: Secondary | ICD-10-CM | POA: Diagnosis not present

## 2016-10-05 DIAGNOSIS — D631 Anemia in chronic kidney disease: Secondary | ICD-10-CM | POA: Diagnosis not present

## 2016-10-05 DIAGNOSIS — Z992 Dependence on renal dialysis: Secondary | ICD-10-CM | POA: Diagnosis not present

## 2016-10-05 DIAGNOSIS — N186 End stage renal disease: Secondary | ICD-10-CM | POA: Diagnosis not present

## 2016-10-05 DIAGNOSIS — N2581 Secondary hyperparathyroidism of renal origin: Secondary | ICD-10-CM | POA: Diagnosis not present

## 2016-10-07 DIAGNOSIS — N186 End stage renal disease: Secondary | ICD-10-CM | POA: Diagnosis not present

## 2016-10-07 DIAGNOSIS — Z992 Dependence on renal dialysis: Secondary | ICD-10-CM | POA: Diagnosis not present

## 2016-10-07 DIAGNOSIS — D631 Anemia in chronic kidney disease: Secondary | ICD-10-CM | POA: Diagnosis not present

## 2016-10-07 DIAGNOSIS — N2581 Secondary hyperparathyroidism of renal origin: Secondary | ICD-10-CM | POA: Diagnosis not present

## 2016-10-10 DIAGNOSIS — Z6827 Body mass index (BMI) 27.0-27.9, adult: Secondary | ICD-10-CM | POA: Diagnosis not present

## 2016-10-10 DIAGNOSIS — J069 Acute upper respiratory infection, unspecified: Secondary | ICD-10-CM | POA: Diagnosis not present

## 2016-10-10 DIAGNOSIS — E1165 Type 2 diabetes mellitus with hyperglycemia: Secondary | ICD-10-CM | POA: Diagnosis not present

## 2016-10-10 DIAGNOSIS — Z299 Encounter for prophylactic measures, unspecified: Secondary | ICD-10-CM | POA: Diagnosis not present

## 2016-10-10 DIAGNOSIS — Z789 Other specified health status: Secondary | ICD-10-CM | POA: Diagnosis not present

## 2016-10-12 DIAGNOSIS — Z992 Dependence on renal dialysis: Secondary | ICD-10-CM | POA: Diagnosis not present

## 2016-10-12 DIAGNOSIS — N2581 Secondary hyperparathyroidism of renal origin: Secondary | ICD-10-CM | POA: Diagnosis not present

## 2016-10-12 DIAGNOSIS — N186 End stage renal disease: Secondary | ICD-10-CM | POA: Diagnosis not present

## 2016-10-12 DIAGNOSIS — D631 Anemia in chronic kidney disease: Secondary | ICD-10-CM | POA: Diagnosis not present

## 2016-10-14 DIAGNOSIS — N186 End stage renal disease: Secondary | ICD-10-CM | POA: Diagnosis not present

## 2016-10-14 DIAGNOSIS — Z992 Dependence on renal dialysis: Secondary | ICD-10-CM | POA: Diagnosis not present

## 2016-10-14 DIAGNOSIS — N2581 Secondary hyperparathyroidism of renal origin: Secondary | ICD-10-CM | POA: Diagnosis not present

## 2016-10-14 DIAGNOSIS — D631 Anemia in chronic kidney disease: Secondary | ICD-10-CM | POA: Diagnosis not present

## 2016-10-17 DIAGNOSIS — G4700 Insomnia, unspecified: Secondary | ICD-10-CM | POA: Diagnosis not present

## 2016-10-17 DIAGNOSIS — E663 Overweight: Secondary | ICD-10-CM | POA: Diagnosis not present

## 2016-10-17 DIAGNOSIS — D631 Anemia in chronic kidney disease: Secondary | ICD-10-CM | POA: Diagnosis not present

## 2016-10-17 DIAGNOSIS — G2581 Restless legs syndrome: Secondary | ICD-10-CM | POA: Diagnosis not present

## 2016-10-17 DIAGNOSIS — E1165 Type 2 diabetes mellitus with hyperglycemia: Secondary | ICD-10-CM | POA: Diagnosis not present

## 2016-10-17 DIAGNOSIS — Z299 Encounter for prophylactic measures, unspecified: Secondary | ICD-10-CM | POA: Diagnosis not present

## 2016-10-17 DIAGNOSIS — I10 Essential (primary) hypertension: Secondary | ICD-10-CM | POA: Diagnosis not present

## 2016-10-17 DIAGNOSIS — N2581 Secondary hyperparathyroidism of renal origin: Secondary | ICD-10-CM | POA: Diagnosis not present

## 2016-10-17 DIAGNOSIS — N186 End stage renal disease: Secondary | ICD-10-CM | POA: Diagnosis not present

## 2016-10-17 DIAGNOSIS — Z992 Dependence on renal dialysis: Secondary | ICD-10-CM | POA: Diagnosis not present

## 2016-10-19 DIAGNOSIS — Z992 Dependence on renal dialysis: Secondary | ICD-10-CM | POA: Diagnosis not present

## 2016-10-19 DIAGNOSIS — D631 Anemia in chronic kidney disease: Secondary | ICD-10-CM | POA: Diagnosis not present

## 2016-10-19 DIAGNOSIS — N186 End stage renal disease: Secondary | ICD-10-CM | POA: Diagnosis not present

## 2016-10-19 DIAGNOSIS — N2581 Secondary hyperparathyroidism of renal origin: Secondary | ICD-10-CM | POA: Diagnosis not present

## 2016-10-21 DIAGNOSIS — Z992 Dependence on renal dialysis: Secondary | ICD-10-CM | POA: Diagnosis not present

## 2016-10-21 DIAGNOSIS — D631 Anemia in chronic kidney disease: Secondary | ICD-10-CM | POA: Diagnosis not present

## 2016-10-21 DIAGNOSIS — N186 End stage renal disease: Secondary | ICD-10-CM | POA: Diagnosis not present

## 2016-10-21 DIAGNOSIS — N2581 Secondary hyperparathyroidism of renal origin: Secondary | ICD-10-CM | POA: Diagnosis not present

## 2016-10-24 DIAGNOSIS — N186 End stage renal disease: Secondary | ICD-10-CM | POA: Diagnosis not present

## 2016-10-24 DIAGNOSIS — Z992 Dependence on renal dialysis: Secondary | ICD-10-CM | POA: Diagnosis not present

## 2016-10-24 DIAGNOSIS — N2581 Secondary hyperparathyroidism of renal origin: Secondary | ICD-10-CM | POA: Diagnosis not present

## 2016-10-24 DIAGNOSIS — D631 Anemia in chronic kidney disease: Secondary | ICD-10-CM | POA: Diagnosis not present

## 2016-10-26 DIAGNOSIS — N186 End stage renal disease: Secondary | ICD-10-CM | POA: Diagnosis not present

## 2016-10-26 DIAGNOSIS — N2581 Secondary hyperparathyroidism of renal origin: Secondary | ICD-10-CM | POA: Diagnosis not present

## 2016-10-26 DIAGNOSIS — Z992 Dependence on renal dialysis: Secondary | ICD-10-CM | POA: Diagnosis not present

## 2016-10-26 DIAGNOSIS — D631 Anemia in chronic kidney disease: Secondary | ICD-10-CM | POA: Diagnosis not present

## 2016-10-28 DIAGNOSIS — D631 Anemia in chronic kidney disease: Secondary | ICD-10-CM | POA: Diagnosis not present

## 2016-10-28 DIAGNOSIS — N2581 Secondary hyperparathyroidism of renal origin: Secondary | ICD-10-CM | POA: Diagnosis not present

## 2016-10-28 DIAGNOSIS — N186 End stage renal disease: Secondary | ICD-10-CM | POA: Diagnosis not present

## 2016-10-28 DIAGNOSIS — Z992 Dependence on renal dialysis: Secondary | ICD-10-CM | POA: Diagnosis not present

## 2016-10-30 DIAGNOSIS — Z992 Dependence on renal dialysis: Secondary | ICD-10-CM | POA: Diagnosis not present

## 2016-10-30 DIAGNOSIS — N186 End stage renal disease: Secondary | ICD-10-CM | POA: Diagnosis not present

## 2016-10-30 DIAGNOSIS — D631 Anemia in chronic kidney disease: Secondary | ICD-10-CM | POA: Diagnosis not present

## 2016-10-30 DIAGNOSIS — N2581 Secondary hyperparathyroidism of renal origin: Secondary | ICD-10-CM | POA: Diagnosis not present

## 2016-11-02 DIAGNOSIS — D631 Anemia in chronic kidney disease: Secondary | ICD-10-CM | POA: Diagnosis not present

## 2016-11-02 DIAGNOSIS — N186 End stage renal disease: Secondary | ICD-10-CM | POA: Diagnosis not present

## 2016-11-02 DIAGNOSIS — Z992 Dependence on renal dialysis: Secondary | ICD-10-CM | POA: Diagnosis not present

## 2016-11-02 DIAGNOSIS — N2581 Secondary hyperparathyroidism of renal origin: Secondary | ICD-10-CM | POA: Diagnosis not present

## 2016-11-03 IMAGING — DX DG SHOULDER 2+V*R*
3 series · 3 of 3 positions shown · non-contrast
Comparison: None.

CLINICAL DATA: Fall

EXAM:
RIGHT SHOULDER - 2+ VIEW

[shoulder grashey]
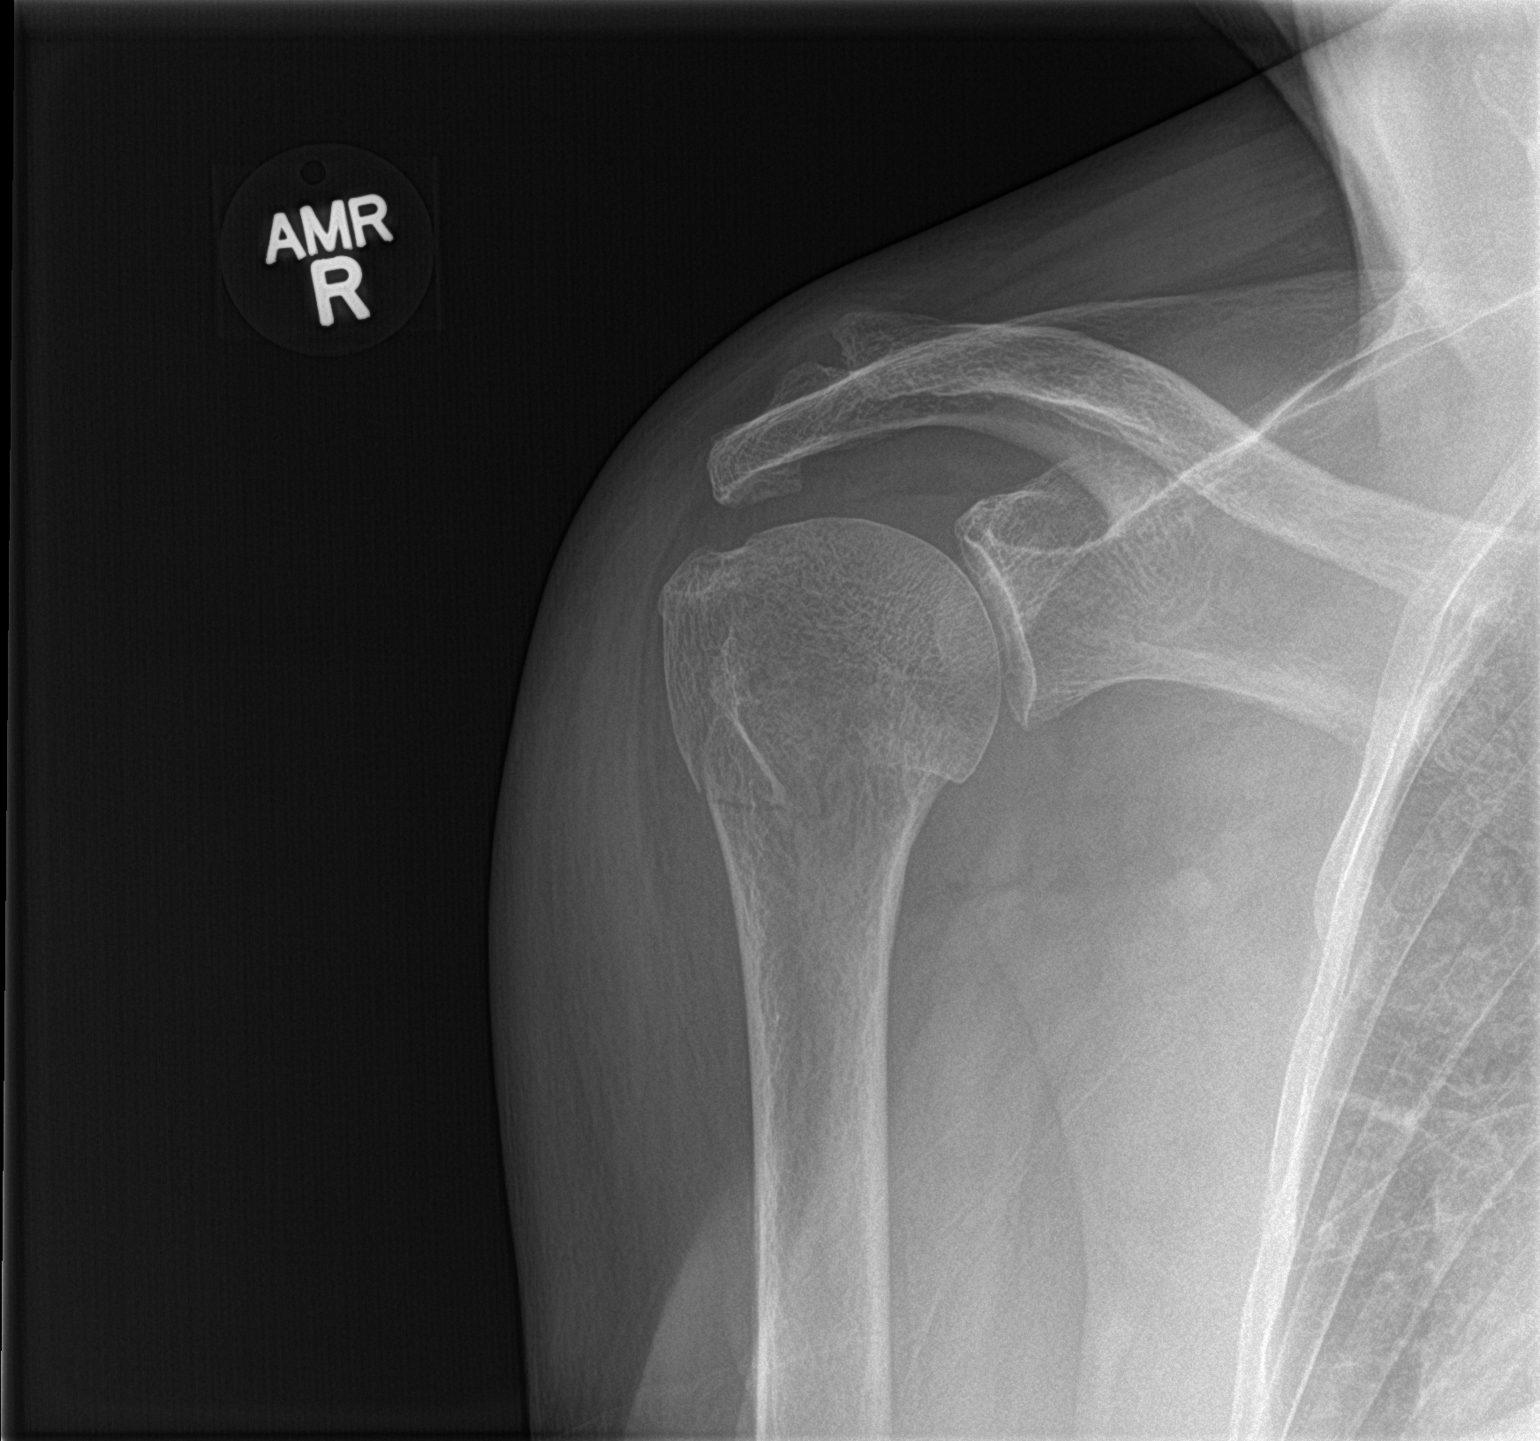

[shoulder y view]
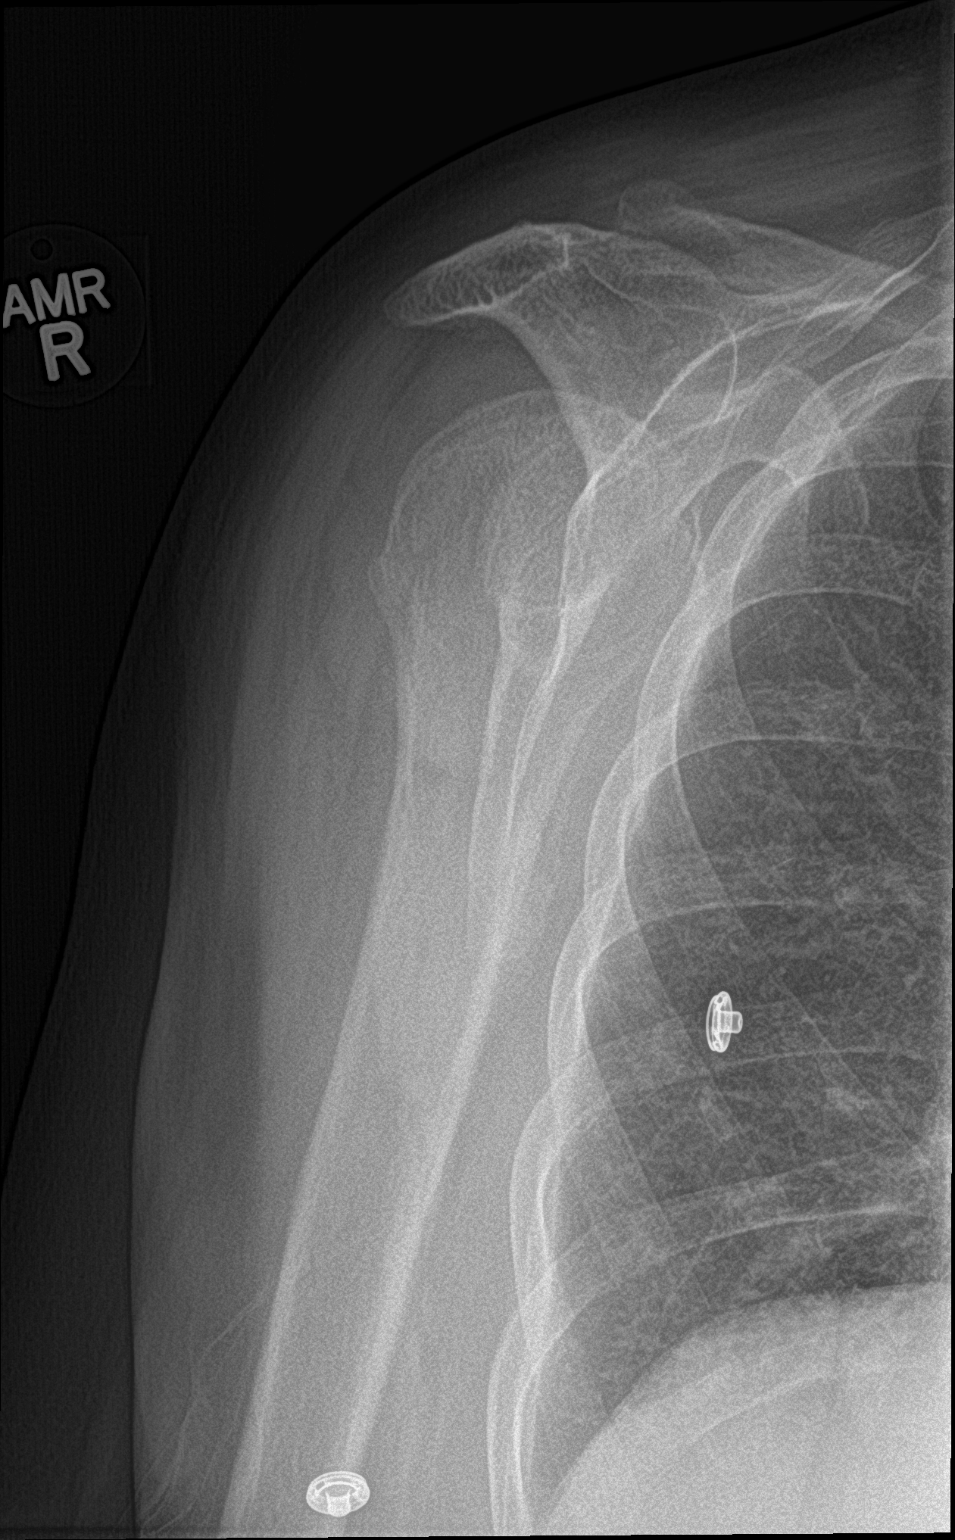

[shoulder axillary]
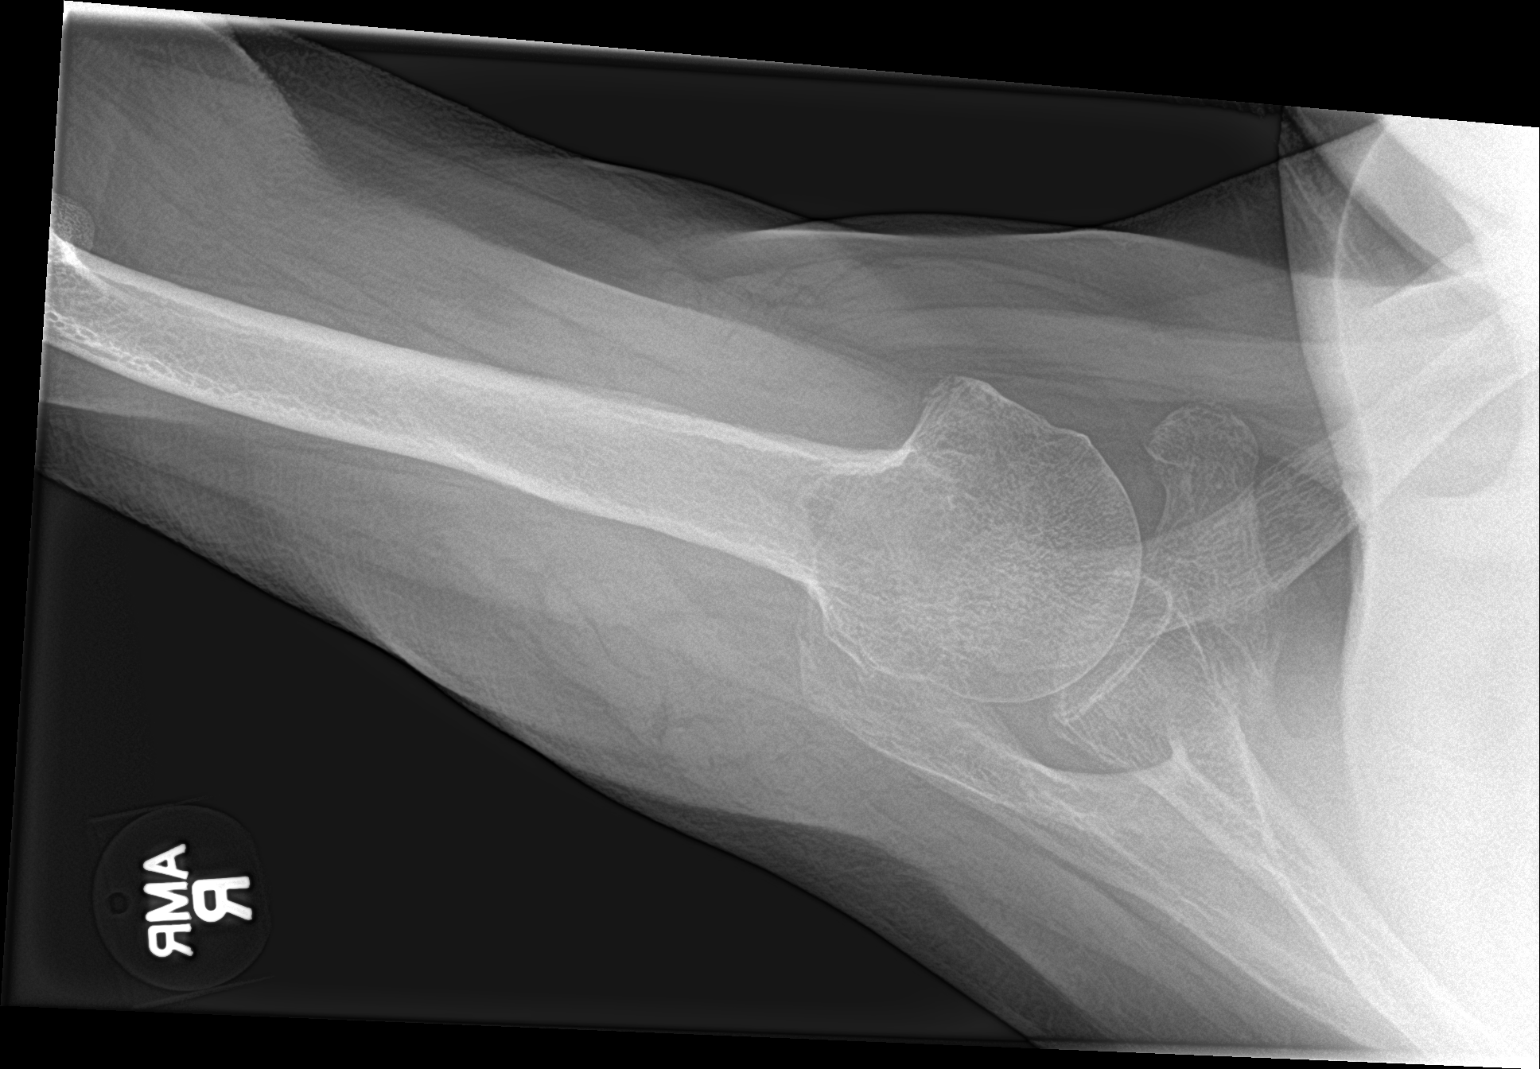

[3 of 3 positions shown; findings below may reference images not displayed]

FINDINGS: Transverse nondisplaced fracture right humeral neck. Shoulder joint
normal. AC joint mild degenerative change.
IMPRESSION: Nondisplaced transverse fracture right humeral neck.

## 2016-11-04 DIAGNOSIS — D631 Anemia in chronic kidney disease: Secondary | ICD-10-CM | POA: Diagnosis not present

## 2016-11-04 DIAGNOSIS — N186 End stage renal disease: Secondary | ICD-10-CM | POA: Diagnosis not present

## 2016-11-04 DIAGNOSIS — Z992 Dependence on renal dialysis: Secondary | ICD-10-CM | POA: Diagnosis not present

## 2016-11-04 DIAGNOSIS — E8770 Fluid overload, unspecified: Secondary | ICD-10-CM | POA: Diagnosis not present

## 2016-11-04 DIAGNOSIS — N2581 Secondary hyperparathyroidism of renal origin: Secondary | ICD-10-CM | POA: Diagnosis not present

## 2016-11-04 DIAGNOSIS — D509 Iron deficiency anemia, unspecified: Secondary | ICD-10-CM | POA: Diagnosis not present

## 2016-11-07 DIAGNOSIS — D509 Iron deficiency anemia, unspecified: Secondary | ICD-10-CM | POA: Diagnosis not present

## 2016-11-07 DIAGNOSIS — Z992 Dependence on renal dialysis: Secondary | ICD-10-CM | POA: Diagnosis not present

## 2016-11-07 DIAGNOSIS — E8770 Fluid overload, unspecified: Secondary | ICD-10-CM | POA: Diagnosis not present

## 2016-11-07 DIAGNOSIS — N186 End stage renal disease: Secondary | ICD-10-CM | POA: Diagnosis not present

## 2016-11-07 DIAGNOSIS — N2581 Secondary hyperparathyroidism of renal origin: Secondary | ICD-10-CM | POA: Diagnosis not present

## 2016-11-07 DIAGNOSIS — D631 Anemia in chronic kidney disease: Secondary | ICD-10-CM | POA: Diagnosis not present

## 2016-11-09 DIAGNOSIS — Z992 Dependence on renal dialysis: Secondary | ICD-10-CM | POA: Diagnosis not present

## 2016-11-09 DIAGNOSIS — N186 End stage renal disease: Secondary | ICD-10-CM | POA: Diagnosis not present

## 2016-11-09 DIAGNOSIS — E8770 Fluid overload, unspecified: Secondary | ICD-10-CM | POA: Diagnosis not present

## 2016-11-09 DIAGNOSIS — D509 Iron deficiency anemia, unspecified: Secondary | ICD-10-CM | POA: Diagnosis not present

## 2016-11-09 DIAGNOSIS — N2581 Secondary hyperparathyroidism of renal origin: Secondary | ICD-10-CM | POA: Diagnosis not present

## 2016-11-09 DIAGNOSIS — D631 Anemia in chronic kidney disease: Secondary | ICD-10-CM | POA: Diagnosis not present

## 2016-11-13 DIAGNOSIS — N2581 Secondary hyperparathyroidism of renal origin: Secondary | ICD-10-CM | POA: Diagnosis not present

## 2016-11-13 DIAGNOSIS — N186 End stage renal disease: Secondary | ICD-10-CM | POA: Diagnosis not present

## 2016-11-13 DIAGNOSIS — E8770 Fluid overload, unspecified: Secondary | ICD-10-CM | POA: Diagnosis not present

## 2016-11-13 DIAGNOSIS — D631 Anemia in chronic kidney disease: Secondary | ICD-10-CM | POA: Diagnosis not present

## 2016-11-13 DIAGNOSIS — Z992 Dependence on renal dialysis: Secondary | ICD-10-CM | POA: Diagnosis not present

## 2016-11-13 DIAGNOSIS — D509 Iron deficiency anemia, unspecified: Secondary | ICD-10-CM | POA: Diagnosis not present

## 2016-11-16 DIAGNOSIS — N2581 Secondary hyperparathyroidism of renal origin: Secondary | ICD-10-CM | POA: Diagnosis not present

## 2016-11-16 DIAGNOSIS — N186 End stage renal disease: Secondary | ICD-10-CM | POA: Diagnosis not present

## 2016-11-16 DIAGNOSIS — D509 Iron deficiency anemia, unspecified: Secondary | ICD-10-CM | POA: Diagnosis not present

## 2016-11-16 DIAGNOSIS — E8770 Fluid overload, unspecified: Secondary | ICD-10-CM | POA: Diagnosis not present

## 2016-11-16 DIAGNOSIS — D631 Anemia in chronic kidney disease: Secondary | ICD-10-CM | POA: Diagnosis not present

## 2016-11-16 DIAGNOSIS — Z992 Dependence on renal dialysis: Secondary | ICD-10-CM | POA: Diagnosis not present

## 2016-11-20 DIAGNOSIS — N2581 Secondary hyperparathyroidism of renal origin: Secondary | ICD-10-CM | POA: Diagnosis not present

## 2016-11-20 DIAGNOSIS — Z992 Dependence on renal dialysis: Secondary | ICD-10-CM | POA: Diagnosis not present

## 2016-11-20 DIAGNOSIS — D631 Anemia in chronic kidney disease: Secondary | ICD-10-CM | POA: Diagnosis not present

## 2016-11-20 DIAGNOSIS — E8770 Fluid overload, unspecified: Secondary | ICD-10-CM | POA: Diagnosis not present

## 2016-11-20 DIAGNOSIS — D509 Iron deficiency anemia, unspecified: Secondary | ICD-10-CM | POA: Diagnosis not present

## 2016-11-20 DIAGNOSIS — N186 End stage renal disease: Secondary | ICD-10-CM | POA: Diagnosis not present

## 2016-11-23 DIAGNOSIS — D509 Iron deficiency anemia, unspecified: Secondary | ICD-10-CM | POA: Diagnosis not present

## 2016-11-23 DIAGNOSIS — D631 Anemia in chronic kidney disease: Secondary | ICD-10-CM | POA: Diagnosis not present

## 2016-11-23 DIAGNOSIS — Z992 Dependence on renal dialysis: Secondary | ICD-10-CM | POA: Diagnosis not present

## 2016-11-23 DIAGNOSIS — E8770 Fluid overload, unspecified: Secondary | ICD-10-CM | POA: Diagnosis not present

## 2016-11-23 DIAGNOSIS — N186 End stage renal disease: Secondary | ICD-10-CM | POA: Diagnosis not present

## 2016-11-23 DIAGNOSIS — N2581 Secondary hyperparathyroidism of renal origin: Secondary | ICD-10-CM | POA: Diagnosis not present

## 2016-11-25 DIAGNOSIS — N2581 Secondary hyperparathyroidism of renal origin: Secondary | ICD-10-CM | POA: Diagnosis not present

## 2016-11-25 DIAGNOSIS — Z992 Dependence on renal dialysis: Secondary | ICD-10-CM | POA: Diagnosis not present

## 2016-11-25 DIAGNOSIS — E8770 Fluid overload, unspecified: Secondary | ICD-10-CM | POA: Diagnosis not present

## 2016-11-25 DIAGNOSIS — N186 End stage renal disease: Secondary | ICD-10-CM | POA: Diagnosis not present

## 2016-11-25 DIAGNOSIS — D509 Iron deficiency anemia, unspecified: Secondary | ICD-10-CM | POA: Diagnosis not present

## 2016-11-25 DIAGNOSIS — D631 Anemia in chronic kidney disease: Secondary | ICD-10-CM | POA: Diagnosis not present

## 2016-11-28 DIAGNOSIS — N2581 Secondary hyperparathyroidism of renal origin: Secondary | ICD-10-CM | POA: Diagnosis not present

## 2016-11-28 DIAGNOSIS — E8770 Fluid overload, unspecified: Secondary | ICD-10-CM | POA: Diagnosis not present

## 2016-11-28 DIAGNOSIS — E119 Type 2 diabetes mellitus without complications: Secondary | ICD-10-CM | POA: Diagnosis not present

## 2016-11-28 DIAGNOSIS — Z794 Long term (current) use of insulin: Secondary | ICD-10-CM | POA: Diagnosis not present

## 2016-11-28 DIAGNOSIS — Z992 Dependence on renal dialysis: Secondary | ICD-10-CM | POA: Diagnosis not present

## 2016-11-28 DIAGNOSIS — D631 Anemia in chronic kidney disease: Secondary | ICD-10-CM | POA: Diagnosis not present

## 2016-11-28 DIAGNOSIS — N186 End stage renal disease: Secondary | ICD-10-CM | POA: Diagnosis not present

## 2016-11-28 DIAGNOSIS — D509 Iron deficiency anemia, unspecified: Secondary | ICD-10-CM | POA: Diagnosis not present

## 2016-11-30 DIAGNOSIS — N2581 Secondary hyperparathyroidism of renal origin: Secondary | ICD-10-CM | POA: Diagnosis not present

## 2016-11-30 DIAGNOSIS — D509 Iron deficiency anemia, unspecified: Secondary | ICD-10-CM | POA: Diagnosis not present

## 2016-11-30 DIAGNOSIS — D631 Anemia in chronic kidney disease: Secondary | ICD-10-CM | POA: Diagnosis not present

## 2016-11-30 DIAGNOSIS — Z992 Dependence on renal dialysis: Secondary | ICD-10-CM | POA: Diagnosis not present

## 2016-11-30 DIAGNOSIS — E8770 Fluid overload, unspecified: Secondary | ICD-10-CM | POA: Diagnosis not present

## 2016-11-30 DIAGNOSIS — N186 End stage renal disease: Secondary | ICD-10-CM | POA: Diagnosis not present

## 2016-12-02 DIAGNOSIS — D631 Anemia in chronic kidney disease: Secondary | ICD-10-CM | POA: Diagnosis not present

## 2016-12-02 DIAGNOSIS — N186 End stage renal disease: Secondary | ICD-10-CM | POA: Diagnosis not present

## 2016-12-02 DIAGNOSIS — E8770 Fluid overload, unspecified: Secondary | ICD-10-CM | POA: Diagnosis not present

## 2016-12-02 DIAGNOSIS — Z992 Dependence on renal dialysis: Secondary | ICD-10-CM | POA: Diagnosis not present

## 2016-12-02 DIAGNOSIS — D509 Iron deficiency anemia, unspecified: Secondary | ICD-10-CM | POA: Diagnosis not present

## 2016-12-02 DIAGNOSIS — N2581 Secondary hyperparathyroidism of renal origin: Secondary | ICD-10-CM | POA: Diagnosis not present

## 2016-12-05 DIAGNOSIS — N186 End stage renal disease: Secondary | ICD-10-CM | POA: Diagnosis not present

## 2016-12-05 DIAGNOSIS — D631 Anemia in chronic kidney disease: Secondary | ICD-10-CM | POA: Diagnosis not present

## 2016-12-05 DIAGNOSIS — Z992 Dependence on renal dialysis: Secondary | ICD-10-CM | POA: Diagnosis not present

## 2016-12-05 DIAGNOSIS — N2581 Secondary hyperparathyroidism of renal origin: Secondary | ICD-10-CM | POA: Diagnosis not present

## 2016-12-05 DIAGNOSIS — D509 Iron deficiency anemia, unspecified: Secondary | ICD-10-CM | POA: Diagnosis not present

## 2016-12-07 DIAGNOSIS — D631 Anemia in chronic kidney disease: Secondary | ICD-10-CM | POA: Diagnosis not present

## 2016-12-07 DIAGNOSIS — D509 Iron deficiency anemia, unspecified: Secondary | ICD-10-CM | POA: Diagnosis not present

## 2016-12-07 DIAGNOSIS — N2581 Secondary hyperparathyroidism of renal origin: Secondary | ICD-10-CM | POA: Diagnosis not present

## 2016-12-07 DIAGNOSIS — N186 End stage renal disease: Secondary | ICD-10-CM | POA: Diagnosis not present

## 2016-12-07 DIAGNOSIS — Z992 Dependence on renal dialysis: Secondary | ICD-10-CM | POA: Diagnosis not present

## 2016-12-09 DIAGNOSIS — Z992 Dependence on renal dialysis: Secondary | ICD-10-CM | POA: Diagnosis not present

## 2016-12-09 DIAGNOSIS — D631 Anemia in chronic kidney disease: Secondary | ICD-10-CM | POA: Diagnosis not present

## 2016-12-09 DIAGNOSIS — N186 End stage renal disease: Secondary | ICD-10-CM | POA: Diagnosis not present

## 2016-12-09 DIAGNOSIS — D509 Iron deficiency anemia, unspecified: Secondary | ICD-10-CM | POA: Diagnosis not present

## 2016-12-09 DIAGNOSIS — N2581 Secondary hyperparathyroidism of renal origin: Secondary | ICD-10-CM | POA: Diagnosis not present

## 2016-12-12 DIAGNOSIS — Z992 Dependence on renal dialysis: Secondary | ICD-10-CM | POA: Diagnosis not present

## 2016-12-12 DIAGNOSIS — D509 Iron deficiency anemia, unspecified: Secondary | ICD-10-CM | POA: Diagnosis not present

## 2016-12-12 DIAGNOSIS — N2581 Secondary hyperparathyroidism of renal origin: Secondary | ICD-10-CM | POA: Diagnosis not present

## 2016-12-12 DIAGNOSIS — N186 End stage renal disease: Secondary | ICD-10-CM | POA: Diagnosis not present

## 2016-12-12 DIAGNOSIS — D631 Anemia in chronic kidney disease: Secondary | ICD-10-CM | POA: Diagnosis not present

## 2016-12-14 DIAGNOSIS — N2581 Secondary hyperparathyroidism of renal origin: Secondary | ICD-10-CM | POA: Diagnosis not present

## 2016-12-14 DIAGNOSIS — D631 Anemia in chronic kidney disease: Secondary | ICD-10-CM | POA: Diagnosis not present

## 2016-12-14 DIAGNOSIS — Z992 Dependence on renal dialysis: Secondary | ICD-10-CM | POA: Diagnosis not present

## 2016-12-14 DIAGNOSIS — N186 End stage renal disease: Secondary | ICD-10-CM | POA: Diagnosis not present

## 2016-12-14 DIAGNOSIS — D509 Iron deficiency anemia, unspecified: Secondary | ICD-10-CM | POA: Diagnosis not present

## 2016-12-16 DIAGNOSIS — Z992 Dependence on renal dialysis: Secondary | ICD-10-CM | POA: Diagnosis not present

## 2016-12-16 DIAGNOSIS — N186 End stage renal disease: Secondary | ICD-10-CM | POA: Diagnosis not present

## 2016-12-16 DIAGNOSIS — D509 Iron deficiency anemia, unspecified: Secondary | ICD-10-CM | POA: Diagnosis not present

## 2016-12-16 DIAGNOSIS — D631 Anemia in chronic kidney disease: Secondary | ICD-10-CM | POA: Diagnosis not present

## 2016-12-16 DIAGNOSIS — N2581 Secondary hyperparathyroidism of renal origin: Secondary | ICD-10-CM | POA: Diagnosis not present

## 2016-12-19 DIAGNOSIS — D509 Iron deficiency anemia, unspecified: Secondary | ICD-10-CM | POA: Diagnosis not present

## 2016-12-19 DIAGNOSIS — Z992 Dependence on renal dialysis: Secondary | ICD-10-CM | POA: Diagnosis not present

## 2016-12-19 DIAGNOSIS — N2581 Secondary hyperparathyroidism of renal origin: Secondary | ICD-10-CM | POA: Diagnosis not present

## 2016-12-19 DIAGNOSIS — D631 Anemia in chronic kidney disease: Secondary | ICD-10-CM | POA: Diagnosis not present

## 2016-12-19 DIAGNOSIS — N186 End stage renal disease: Secondary | ICD-10-CM | POA: Diagnosis not present

## 2016-12-21 DIAGNOSIS — N186 End stage renal disease: Secondary | ICD-10-CM | POA: Diagnosis not present

## 2016-12-21 DIAGNOSIS — D631 Anemia in chronic kidney disease: Secondary | ICD-10-CM | POA: Diagnosis not present

## 2016-12-21 DIAGNOSIS — Z992 Dependence on renal dialysis: Secondary | ICD-10-CM | POA: Diagnosis not present

## 2016-12-21 DIAGNOSIS — D509 Iron deficiency anemia, unspecified: Secondary | ICD-10-CM | POA: Diagnosis not present

## 2016-12-21 DIAGNOSIS — N2581 Secondary hyperparathyroidism of renal origin: Secondary | ICD-10-CM | POA: Diagnosis not present

## 2016-12-25 DIAGNOSIS — D509 Iron deficiency anemia, unspecified: Secondary | ICD-10-CM | POA: Diagnosis not present

## 2016-12-25 DIAGNOSIS — Z992 Dependence on renal dialysis: Secondary | ICD-10-CM | POA: Diagnosis not present

## 2016-12-25 DIAGNOSIS — N2581 Secondary hyperparathyroidism of renal origin: Secondary | ICD-10-CM | POA: Diagnosis not present

## 2016-12-25 DIAGNOSIS — N186 End stage renal disease: Secondary | ICD-10-CM | POA: Diagnosis not present

## 2016-12-25 DIAGNOSIS — D631 Anemia in chronic kidney disease: Secondary | ICD-10-CM | POA: Diagnosis not present

## 2016-12-28 DIAGNOSIS — D509 Iron deficiency anemia, unspecified: Secondary | ICD-10-CM | POA: Diagnosis not present

## 2016-12-28 DIAGNOSIS — N186 End stage renal disease: Secondary | ICD-10-CM | POA: Diagnosis not present

## 2016-12-28 DIAGNOSIS — Z992 Dependence on renal dialysis: Secondary | ICD-10-CM | POA: Diagnosis not present

## 2016-12-28 DIAGNOSIS — D631 Anemia in chronic kidney disease: Secondary | ICD-10-CM | POA: Diagnosis not present

## 2016-12-28 DIAGNOSIS — N2581 Secondary hyperparathyroidism of renal origin: Secondary | ICD-10-CM | POA: Diagnosis not present

## 2016-12-30 DIAGNOSIS — N2581 Secondary hyperparathyroidism of renal origin: Secondary | ICD-10-CM | POA: Diagnosis not present

## 2016-12-30 DIAGNOSIS — N186 End stage renal disease: Secondary | ICD-10-CM | POA: Diagnosis not present

## 2016-12-30 DIAGNOSIS — D631 Anemia in chronic kidney disease: Secondary | ICD-10-CM | POA: Diagnosis not present

## 2016-12-30 DIAGNOSIS — Z992 Dependence on renal dialysis: Secondary | ICD-10-CM | POA: Diagnosis not present

## 2016-12-30 DIAGNOSIS — D509 Iron deficiency anemia, unspecified: Secondary | ICD-10-CM | POA: Diagnosis not present

## 2017-01-02 DIAGNOSIS — D631 Anemia in chronic kidney disease: Secondary | ICD-10-CM | POA: Diagnosis not present

## 2017-01-02 DIAGNOSIS — D509 Iron deficiency anemia, unspecified: Secondary | ICD-10-CM | POA: Diagnosis not present

## 2017-01-02 DIAGNOSIS — N2581 Secondary hyperparathyroidism of renal origin: Secondary | ICD-10-CM | POA: Diagnosis not present

## 2017-01-02 DIAGNOSIS — Z992 Dependence on renal dialysis: Secondary | ICD-10-CM | POA: Diagnosis not present

## 2017-01-02 DIAGNOSIS — N186 End stage renal disease: Secondary | ICD-10-CM | POA: Diagnosis not present

## 2017-01-04 DIAGNOSIS — Z992 Dependence on renal dialysis: Secondary | ICD-10-CM | POA: Diagnosis not present

## 2017-01-04 DIAGNOSIS — N2581 Secondary hyperparathyroidism of renal origin: Secondary | ICD-10-CM | POA: Diagnosis not present

## 2017-01-04 DIAGNOSIS — D631 Anemia in chronic kidney disease: Secondary | ICD-10-CM | POA: Diagnosis not present

## 2017-01-04 DIAGNOSIS — D509 Iron deficiency anemia, unspecified: Secondary | ICD-10-CM | POA: Diagnosis not present

## 2017-01-04 DIAGNOSIS — N186 End stage renal disease: Secondary | ICD-10-CM | POA: Diagnosis not present

## 2017-01-06 DIAGNOSIS — N186 End stage renal disease: Secondary | ICD-10-CM | POA: Diagnosis not present

## 2017-01-06 DIAGNOSIS — D509 Iron deficiency anemia, unspecified: Secondary | ICD-10-CM | POA: Diagnosis not present

## 2017-01-06 DIAGNOSIS — D631 Anemia in chronic kidney disease: Secondary | ICD-10-CM | POA: Diagnosis not present

## 2017-01-06 DIAGNOSIS — N2581 Secondary hyperparathyroidism of renal origin: Secondary | ICD-10-CM | POA: Diagnosis not present

## 2017-01-06 DIAGNOSIS — Z992 Dependence on renal dialysis: Secondary | ICD-10-CM | POA: Diagnosis not present

## 2017-01-09 DIAGNOSIS — Z992 Dependence on renal dialysis: Secondary | ICD-10-CM | POA: Diagnosis not present

## 2017-01-09 DIAGNOSIS — N186 End stage renal disease: Secondary | ICD-10-CM | POA: Diagnosis not present

## 2017-01-09 DIAGNOSIS — D509 Iron deficiency anemia, unspecified: Secondary | ICD-10-CM | POA: Diagnosis not present

## 2017-01-09 DIAGNOSIS — N2581 Secondary hyperparathyroidism of renal origin: Secondary | ICD-10-CM | POA: Diagnosis not present

## 2017-01-09 DIAGNOSIS — D631 Anemia in chronic kidney disease: Secondary | ICD-10-CM | POA: Diagnosis not present

## 2017-01-11 DIAGNOSIS — D631 Anemia in chronic kidney disease: Secondary | ICD-10-CM | POA: Diagnosis not present

## 2017-01-11 DIAGNOSIS — Z992 Dependence on renal dialysis: Secondary | ICD-10-CM | POA: Diagnosis not present

## 2017-01-11 DIAGNOSIS — D509 Iron deficiency anemia, unspecified: Secondary | ICD-10-CM | POA: Diagnosis not present

## 2017-01-11 DIAGNOSIS — N186 End stage renal disease: Secondary | ICD-10-CM | POA: Diagnosis not present

## 2017-01-11 DIAGNOSIS — N2581 Secondary hyperparathyroidism of renal origin: Secondary | ICD-10-CM | POA: Diagnosis not present

## 2017-01-13 DIAGNOSIS — N186 End stage renal disease: Secondary | ICD-10-CM | POA: Diagnosis not present

## 2017-01-13 DIAGNOSIS — N2581 Secondary hyperparathyroidism of renal origin: Secondary | ICD-10-CM | POA: Diagnosis not present

## 2017-01-13 DIAGNOSIS — D631 Anemia in chronic kidney disease: Secondary | ICD-10-CM | POA: Diagnosis not present

## 2017-01-13 DIAGNOSIS — D509 Iron deficiency anemia, unspecified: Secondary | ICD-10-CM | POA: Diagnosis not present

## 2017-01-13 DIAGNOSIS — Z992 Dependence on renal dialysis: Secondary | ICD-10-CM | POA: Diagnosis not present

## 2017-01-16 DIAGNOSIS — N186 End stage renal disease: Secondary | ICD-10-CM | POA: Diagnosis not present

## 2017-01-16 DIAGNOSIS — D509 Iron deficiency anemia, unspecified: Secondary | ICD-10-CM | POA: Diagnosis not present

## 2017-01-16 DIAGNOSIS — N2581 Secondary hyperparathyroidism of renal origin: Secondary | ICD-10-CM | POA: Diagnosis not present

## 2017-01-16 DIAGNOSIS — D631 Anemia in chronic kidney disease: Secondary | ICD-10-CM | POA: Diagnosis not present

## 2017-01-16 DIAGNOSIS — Z992 Dependence on renal dialysis: Secondary | ICD-10-CM | POA: Diagnosis not present

## 2017-01-18 DIAGNOSIS — N186 End stage renal disease: Secondary | ICD-10-CM | POA: Diagnosis not present

## 2017-01-18 DIAGNOSIS — Z992 Dependence on renal dialysis: Secondary | ICD-10-CM | POA: Diagnosis not present

## 2017-01-18 DIAGNOSIS — D509 Iron deficiency anemia, unspecified: Secondary | ICD-10-CM | POA: Diagnosis not present

## 2017-01-18 DIAGNOSIS — N2581 Secondary hyperparathyroidism of renal origin: Secondary | ICD-10-CM | POA: Diagnosis not present

## 2017-01-18 DIAGNOSIS — D631 Anemia in chronic kidney disease: Secondary | ICD-10-CM | POA: Diagnosis not present

## 2017-01-20 DIAGNOSIS — N186 End stage renal disease: Secondary | ICD-10-CM | POA: Diagnosis not present

## 2017-01-20 DIAGNOSIS — Z992 Dependence on renal dialysis: Secondary | ICD-10-CM | POA: Diagnosis not present

## 2017-01-20 DIAGNOSIS — N2581 Secondary hyperparathyroidism of renal origin: Secondary | ICD-10-CM | POA: Diagnosis not present

## 2017-01-20 DIAGNOSIS — D631 Anemia in chronic kidney disease: Secondary | ICD-10-CM | POA: Diagnosis not present

## 2017-01-20 DIAGNOSIS — D509 Iron deficiency anemia, unspecified: Secondary | ICD-10-CM | POA: Diagnosis not present

## 2017-01-23 DIAGNOSIS — N2581 Secondary hyperparathyroidism of renal origin: Secondary | ICD-10-CM | POA: Diagnosis not present

## 2017-01-23 DIAGNOSIS — N186 End stage renal disease: Secondary | ICD-10-CM | POA: Diagnosis not present

## 2017-01-23 DIAGNOSIS — D631 Anemia in chronic kidney disease: Secondary | ICD-10-CM | POA: Diagnosis not present

## 2017-01-23 DIAGNOSIS — Z992 Dependence on renal dialysis: Secondary | ICD-10-CM | POA: Diagnosis not present

## 2017-01-23 DIAGNOSIS — D509 Iron deficiency anemia, unspecified: Secondary | ICD-10-CM | POA: Diagnosis not present

## 2017-01-25 DIAGNOSIS — D509 Iron deficiency anemia, unspecified: Secondary | ICD-10-CM | POA: Diagnosis not present

## 2017-01-25 DIAGNOSIS — Z992 Dependence on renal dialysis: Secondary | ICD-10-CM | POA: Diagnosis not present

## 2017-01-25 DIAGNOSIS — N2581 Secondary hyperparathyroidism of renal origin: Secondary | ICD-10-CM | POA: Diagnosis not present

## 2017-01-25 DIAGNOSIS — N186 End stage renal disease: Secondary | ICD-10-CM | POA: Diagnosis not present

## 2017-01-25 DIAGNOSIS — D631 Anemia in chronic kidney disease: Secondary | ICD-10-CM | POA: Diagnosis not present

## 2017-01-27 DIAGNOSIS — Z992 Dependence on renal dialysis: Secondary | ICD-10-CM | POA: Diagnosis not present

## 2017-01-27 DIAGNOSIS — N186 End stage renal disease: Secondary | ICD-10-CM | POA: Diagnosis not present

## 2017-01-27 DIAGNOSIS — D509 Iron deficiency anemia, unspecified: Secondary | ICD-10-CM | POA: Diagnosis not present

## 2017-01-27 DIAGNOSIS — D631 Anemia in chronic kidney disease: Secondary | ICD-10-CM | POA: Diagnosis not present

## 2017-01-27 DIAGNOSIS — N2581 Secondary hyperparathyroidism of renal origin: Secondary | ICD-10-CM | POA: Diagnosis not present

## 2017-01-30 DIAGNOSIS — D631 Anemia in chronic kidney disease: Secondary | ICD-10-CM | POA: Diagnosis not present

## 2017-01-30 DIAGNOSIS — N186 End stage renal disease: Secondary | ICD-10-CM | POA: Diagnosis not present

## 2017-01-30 DIAGNOSIS — Z992 Dependence on renal dialysis: Secondary | ICD-10-CM | POA: Diagnosis not present

## 2017-01-30 DIAGNOSIS — N2581 Secondary hyperparathyroidism of renal origin: Secondary | ICD-10-CM | POA: Diagnosis not present

## 2017-01-30 DIAGNOSIS — D509 Iron deficiency anemia, unspecified: Secondary | ICD-10-CM | POA: Diagnosis not present

## 2017-02-01 DIAGNOSIS — D509 Iron deficiency anemia, unspecified: Secondary | ICD-10-CM | POA: Diagnosis not present

## 2017-02-01 DIAGNOSIS — N2581 Secondary hyperparathyroidism of renal origin: Secondary | ICD-10-CM | POA: Diagnosis not present

## 2017-02-01 DIAGNOSIS — Z992 Dependence on renal dialysis: Secondary | ICD-10-CM | POA: Diagnosis not present

## 2017-02-01 DIAGNOSIS — D631 Anemia in chronic kidney disease: Secondary | ICD-10-CM | POA: Diagnosis not present

## 2017-02-01 DIAGNOSIS — N186 End stage renal disease: Secondary | ICD-10-CM | POA: Diagnosis not present

## 2017-02-03 DIAGNOSIS — N186 End stage renal disease: Secondary | ICD-10-CM | POA: Diagnosis not present

## 2017-02-03 DIAGNOSIS — D509 Iron deficiency anemia, unspecified: Secondary | ICD-10-CM | POA: Diagnosis not present

## 2017-02-03 DIAGNOSIS — D631 Anemia in chronic kidney disease: Secondary | ICD-10-CM | POA: Diagnosis not present

## 2017-02-03 DIAGNOSIS — N2581 Secondary hyperparathyroidism of renal origin: Secondary | ICD-10-CM | POA: Diagnosis not present

## 2017-02-03 DIAGNOSIS — Z992 Dependence on renal dialysis: Secondary | ICD-10-CM | POA: Diagnosis not present

## 2017-02-06 DIAGNOSIS — Z992 Dependence on renal dialysis: Secondary | ICD-10-CM | POA: Diagnosis not present

## 2017-02-06 DIAGNOSIS — D509 Iron deficiency anemia, unspecified: Secondary | ICD-10-CM | POA: Diagnosis not present

## 2017-02-06 DIAGNOSIS — D631 Anemia in chronic kidney disease: Secondary | ICD-10-CM | POA: Diagnosis not present

## 2017-02-06 DIAGNOSIS — N2581 Secondary hyperparathyroidism of renal origin: Secondary | ICD-10-CM | POA: Diagnosis not present

## 2017-02-06 DIAGNOSIS — N186 End stage renal disease: Secondary | ICD-10-CM | POA: Diagnosis not present

## 2017-02-08 DIAGNOSIS — Z992 Dependence on renal dialysis: Secondary | ICD-10-CM | POA: Diagnosis not present

## 2017-02-08 DIAGNOSIS — N2581 Secondary hyperparathyroidism of renal origin: Secondary | ICD-10-CM | POA: Diagnosis not present

## 2017-02-08 DIAGNOSIS — N186 End stage renal disease: Secondary | ICD-10-CM | POA: Diagnosis not present

## 2017-02-08 DIAGNOSIS — D509 Iron deficiency anemia, unspecified: Secondary | ICD-10-CM | POA: Diagnosis not present

## 2017-02-08 DIAGNOSIS — D631 Anemia in chronic kidney disease: Secondary | ICD-10-CM | POA: Diagnosis not present

## 2017-02-10 DIAGNOSIS — N186 End stage renal disease: Secondary | ICD-10-CM | POA: Diagnosis not present

## 2017-02-10 DIAGNOSIS — D631 Anemia in chronic kidney disease: Secondary | ICD-10-CM | POA: Diagnosis not present

## 2017-02-10 DIAGNOSIS — N2581 Secondary hyperparathyroidism of renal origin: Secondary | ICD-10-CM | POA: Diagnosis not present

## 2017-02-10 DIAGNOSIS — D509 Iron deficiency anemia, unspecified: Secondary | ICD-10-CM | POA: Diagnosis not present

## 2017-02-10 DIAGNOSIS — Z992 Dependence on renal dialysis: Secondary | ICD-10-CM | POA: Diagnosis not present

## 2017-02-13 DIAGNOSIS — D631 Anemia in chronic kidney disease: Secondary | ICD-10-CM | POA: Diagnosis not present

## 2017-02-13 DIAGNOSIS — D509 Iron deficiency anemia, unspecified: Secondary | ICD-10-CM | POA: Diagnosis not present

## 2017-02-13 DIAGNOSIS — N186 End stage renal disease: Secondary | ICD-10-CM | POA: Diagnosis not present

## 2017-02-13 DIAGNOSIS — Z992 Dependence on renal dialysis: Secondary | ICD-10-CM | POA: Diagnosis not present

## 2017-02-13 DIAGNOSIS — N2581 Secondary hyperparathyroidism of renal origin: Secondary | ICD-10-CM | POA: Diagnosis not present

## 2017-02-15 DIAGNOSIS — D509 Iron deficiency anemia, unspecified: Secondary | ICD-10-CM | POA: Diagnosis not present

## 2017-02-15 DIAGNOSIS — N2581 Secondary hyperparathyroidism of renal origin: Secondary | ICD-10-CM | POA: Diagnosis not present

## 2017-02-15 DIAGNOSIS — Z992 Dependence on renal dialysis: Secondary | ICD-10-CM | POA: Diagnosis not present

## 2017-02-15 DIAGNOSIS — D631 Anemia in chronic kidney disease: Secondary | ICD-10-CM | POA: Diagnosis not present

## 2017-02-15 DIAGNOSIS — N186 End stage renal disease: Secondary | ICD-10-CM | POA: Diagnosis not present

## 2017-02-19 DIAGNOSIS — N186 End stage renal disease: Secondary | ICD-10-CM | POA: Diagnosis not present

## 2017-02-19 DIAGNOSIS — D509 Iron deficiency anemia, unspecified: Secondary | ICD-10-CM | POA: Diagnosis not present

## 2017-02-19 DIAGNOSIS — N2581 Secondary hyperparathyroidism of renal origin: Secondary | ICD-10-CM | POA: Diagnosis not present

## 2017-02-19 DIAGNOSIS — Z992 Dependence on renal dialysis: Secondary | ICD-10-CM | POA: Diagnosis not present

## 2017-02-19 DIAGNOSIS — D631 Anemia in chronic kidney disease: Secondary | ICD-10-CM | POA: Diagnosis not present

## 2017-02-20 DIAGNOSIS — N2581 Secondary hyperparathyroidism of renal origin: Secondary | ICD-10-CM | POA: Diagnosis not present

## 2017-02-20 DIAGNOSIS — N186 End stage renal disease: Secondary | ICD-10-CM | POA: Diagnosis not present

## 2017-02-20 DIAGNOSIS — D509 Iron deficiency anemia, unspecified: Secondary | ICD-10-CM | POA: Diagnosis not present

## 2017-02-20 DIAGNOSIS — Z992 Dependence on renal dialysis: Secondary | ICD-10-CM | POA: Diagnosis not present

## 2017-02-20 DIAGNOSIS — D631 Anemia in chronic kidney disease: Secondary | ICD-10-CM | POA: Diagnosis not present

## 2017-02-22 DIAGNOSIS — D631 Anemia in chronic kidney disease: Secondary | ICD-10-CM | POA: Diagnosis not present

## 2017-02-22 DIAGNOSIS — N186 End stage renal disease: Secondary | ICD-10-CM | POA: Diagnosis not present

## 2017-02-22 DIAGNOSIS — D509 Iron deficiency anemia, unspecified: Secondary | ICD-10-CM | POA: Diagnosis not present

## 2017-02-22 DIAGNOSIS — N2581 Secondary hyperparathyroidism of renal origin: Secondary | ICD-10-CM | POA: Diagnosis not present

## 2017-02-22 DIAGNOSIS — Z992 Dependence on renal dialysis: Secondary | ICD-10-CM | POA: Diagnosis not present

## 2017-02-24 DIAGNOSIS — D509 Iron deficiency anemia, unspecified: Secondary | ICD-10-CM | POA: Diagnosis not present

## 2017-02-24 DIAGNOSIS — N186 End stage renal disease: Secondary | ICD-10-CM | POA: Diagnosis not present

## 2017-02-24 DIAGNOSIS — N2581 Secondary hyperparathyroidism of renal origin: Secondary | ICD-10-CM | POA: Diagnosis not present

## 2017-02-24 DIAGNOSIS — Z992 Dependence on renal dialysis: Secondary | ICD-10-CM | POA: Diagnosis not present

## 2017-02-24 DIAGNOSIS — D631 Anemia in chronic kidney disease: Secondary | ICD-10-CM | POA: Diagnosis not present

## 2017-02-27 DIAGNOSIS — N186 End stage renal disease: Secondary | ICD-10-CM | POA: Diagnosis not present

## 2017-02-27 DIAGNOSIS — D631 Anemia in chronic kidney disease: Secondary | ICD-10-CM | POA: Diagnosis not present

## 2017-02-27 DIAGNOSIS — N2581 Secondary hyperparathyroidism of renal origin: Secondary | ICD-10-CM | POA: Diagnosis not present

## 2017-02-27 DIAGNOSIS — D509 Iron deficiency anemia, unspecified: Secondary | ICD-10-CM | POA: Diagnosis not present

## 2017-02-27 DIAGNOSIS — Z992 Dependence on renal dialysis: Secondary | ICD-10-CM | POA: Diagnosis not present

## 2017-02-27 DIAGNOSIS — E119 Type 2 diabetes mellitus without complications: Secondary | ICD-10-CM | POA: Diagnosis not present

## 2017-02-27 DIAGNOSIS — Z794 Long term (current) use of insulin: Secondary | ICD-10-CM | POA: Diagnosis not present

## 2017-03-01 DIAGNOSIS — N2581 Secondary hyperparathyroidism of renal origin: Secondary | ICD-10-CM | POA: Diagnosis not present

## 2017-03-01 DIAGNOSIS — D509 Iron deficiency anemia, unspecified: Secondary | ICD-10-CM | POA: Diagnosis not present

## 2017-03-01 DIAGNOSIS — Z992 Dependence on renal dialysis: Secondary | ICD-10-CM | POA: Diagnosis not present

## 2017-03-01 DIAGNOSIS — N186 End stage renal disease: Secondary | ICD-10-CM | POA: Diagnosis not present

## 2017-03-01 DIAGNOSIS — D631 Anemia in chronic kidney disease: Secondary | ICD-10-CM | POA: Diagnosis not present

## 2017-03-03 DIAGNOSIS — Z992 Dependence on renal dialysis: Secondary | ICD-10-CM | POA: Diagnosis not present

## 2017-03-03 DIAGNOSIS — N186 End stage renal disease: Secondary | ICD-10-CM | POA: Diagnosis not present

## 2017-03-03 DIAGNOSIS — D509 Iron deficiency anemia, unspecified: Secondary | ICD-10-CM | POA: Diagnosis not present

## 2017-03-03 DIAGNOSIS — D631 Anemia in chronic kidney disease: Secondary | ICD-10-CM | POA: Diagnosis not present

## 2017-03-03 DIAGNOSIS — N2581 Secondary hyperparathyroidism of renal origin: Secondary | ICD-10-CM | POA: Diagnosis not present

## 2017-03-04 DIAGNOSIS — N186 End stage renal disease: Secondary | ICD-10-CM | POA: Diagnosis not present

## 2017-03-04 DIAGNOSIS — Z992 Dependence on renal dialysis: Secondary | ICD-10-CM | POA: Diagnosis not present

## 2017-03-06 DIAGNOSIS — D631 Anemia in chronic kidney disease: Secondary | ICD-10-CM | POA: Diagnosis not present

## 2017-03-06 DIAGNOSIS — Z992 Dependence on renal dialysis: Secondary | ICD-10-CM | POA: Diagnosis not present

## 2017-03-06 DIAGNOSIS — N2581 Secondary hyperparathyroidism of renal origin: Secondary | ICD-10-CM | POA: Diagnosis not present

## 2017-03-06 DIAGNOSIS — E669 Obesity, unspecified: Secondary | ICD-10-CM | POA: Diagnosis not present

## 2017-03-06 DIAGNOSIS — N186 End stage renal disease: Secondary | ICD-10-CM | POA: Diagnosis not present

## 2017-03-06 DIAGNOSIS — D509 Iron deficiency anemia, unspecified: Secondary | ICD-10-CM | POA: Diagnosis not present

## 2017-03-08 DIAGNOSIS — N186 End stage renal disease: Secondary | ICD-10-CM | POA: Diagnosis not present

## 2017-03-08 DIAGNOSIS — E669 Obesity, unspecified: Secondary | ICD-10-CM | POA: Diagnosis not present

## 2017-03-08 DIAGNOSIS — D509 Iron deficiency anemia, unspecified: Secondary | ICD-10-CM | POA: Diagnosis not present

## 2017-03-08 DIAGNOSIS — Z992 Dependence on renal dialysis: Secondary | ICD-10-CM | POA: Diagnosis not present

## 2017-03-08 DIAGNOSIS — N2581 Secondary hyperparathyroidism of renal origin: Secondary | ICD-10-CM | POA: Diagnosis not present

## 2017-03-08 DIAGNOSIS — D631 Anemia in chronic kidney disease: Secondary | ICD-10-CM | POA: Diagnosis not present

## 2017-03-10 DIAGNOSIS — E669 Obesity, unspecified: Secondary | ICD-10-CM | POA: Diagnosis not present

## 2017-03-10 DIAGNOSIS — N186 End stage renal disease: Secondary | ICD-10-CM | POA: Diagnosis not present

## 2017-03-10 DIAGNOSIS — D631 Anemia in chronic kidney disease: Secondary | ICD-10-CM | POA: Diagnosis not present

## 2017-03-10 DIAGNOSIS — N2581 Secondary hyperparathyroidism of renal origin: Secondary | ICD-10-CM | POA: Diagnosis not present

## 2017-03-10 DIAGNOSIS — D509 Iron deficiency anemia, unspecified: Secondary | ICD-10-CM | POA: Diagnosis not present

## 2017-03-10 DIAGNOSIS — Z992 Dependence on renal dialysis: Secondary | ICD-10-CM | POA: Diagnosis not present

## 2017-03-13 DIAGNOSIS — D509 Iron deficiency anemia, unspecified: Secondary | ICD-10-CM | POA: Diagnosis not present

## 2017-03-13 DIAGNOSIS — N2581 Secondary hyperparathyroidism of renal origin: Secondary | ICD-10-CM | POA: Diagnosis not present

## 2017-03-13 DIAGNOSIS — N186 End stage renal disease: Secondary | ICD-10-CM | POA: Diagnosis not present

## 2017-03-13 DIAGNOSIS — E669 Obesity, unspecified: Secondary | ICD-10-CM | POA: Diagnosis not present

## 2017-03-13 DIAGNOSIS — D631 Anemia in chronic kidney disease: Secondary | ICD-10-CM | POA: Diagnosis not present

## 2017-03-13 DIAGNOSIS — Z992 Dependence on renal dialysis: Secondary | ICD-10-CM | POA: Diagnosis not present

## 2017-03-15 DIAGNOSIS — D631 Anemia in chronic kidney disease: Secondary | ICD-10-CM | POA: Diagnosis not present

## 2017-03-15 DIAGNOSIS — D509 Iron deficiency anemia, unspecified: Secondary | ICD-10-CM | POA: Diagnosis not present

## 2017-03-15 DIAGNOSIS — E669 Obesity, unspecified: Secondary | ICD-10-CM | POA: Diagnosis not present

## 2017-03-15 DIAGNOSIS — Z992 Dependence on renal dialysis: Secondary | ICD-10-CM | POA: Diagnosis not present

## 2017-03-15 DIAGNOSIS — N2581 Secondary hyperparathyroidism of renal origin: Secondary | ICD-10-CM | POA: Diagnosis not present

## 2017-03-15 DIAGNOSIS — N186 End stage renal disease: Secondary | ICD-10-CM | POA: Diagnosis not present

## 2017-03-19 DIAGNOSIS — E669 Obesity, unspecified: Secondary | ICD-10-CM | POA: Diagnosis not present

## 2017-03-19 DIAGNOSIS — N2581 Secondary hyperparathyroidism of renal origin: Secondary | ICD-10-CM | POA: Diagnosis not present

## 2017-03-19 DIAGNOSIS — Z992 Dependence on renal dialysis: Secondary | ICD-10-CM | POA: Diagnosis not present

## 2017-03-19 DIAGNOSIS — N186 End stage renal disease: Secondary | ICD-10-CM | POA: Diagnosis not present

## 2017-03-19 DIAGNOSIS — D509 Iron deficiency anemia, unspecified: Secondary | ICD-10-CM | POA: Diagnosis not present

## 2017-03-19 DIAGNOSIS — D631 Anemia in chronic kidney disease: Secondary | ICD-10-CM | POA: Diagnosis not present

## 2017-03-22 DIAGNOSIS — N2581 Secondary hyperparathyroidism of renal origin: Secondary | ICD-10-CM | POA: Diagnosis not present

## 2017-03-22 DIAGNOSIS — N186 End stage renal disease: Secondary | ICD-10-CM | POA: Diagnosis not present

## 2017-03-22 DIAGNOSIS — D631 Anemia in chronic kidney disease: Secondary | ICD-10-CM | POA: Diagnosis not present

## 2017-03-22 DIAGNOSIS — E669 Obesity, unspecified: Secondary | ICD-10-CM | POA: Diagnosis not present

## 2017-03-22 DIAGNOSIS — Z992 Dependence on renal dialysis: Secondary | ICD-10-CM | POA: Diagnosis not present

## 2017-03-22 DIAGNOSIS — D509 Iron deficiency anemia, unspecified: Secondary | ICD-10-CM | POA: Diagnosis not present

## 2017-03-24 DIAGNOSIS — D631 Anemia in chronic kidney disease: Secondary | ICD-10-CM | POA: Diagnosis not present

## 2017-03-24 DIAGNOSIS — N186 End stage renal disease: Secondary | ICD-10-CM | POA: Diagnosis not present

## 2017-03-24 DIAGNOSIS — Z992 Dependence on renal dialysis: Secondary | ICD-10-CM | POA: Diagnosis not present

## 2017-03-24 DIAGNOSIS — D509 Iron deficiency anemia, unspecified: Secondary | ICD-10-CM | POA: Diagnosis not present

## 2017-03-24 DIAGNOSIS — E669 Obesity, unspecified: Secondary | ICD-10-CM | POA: Diagnosis not present

## 2017-03-24 DIAGNOSIS — N2581 Secondary hyperparathyroidism of renal origin: Secondary | ICD-10-CM | POA: Diagnosis not present

## 2017-03-27 DIAGNOSIS — D509 Iron deficiency anemia, unspecified: Secondary | ICD-10-CM | POA: Diagnosis not present

## 2017-03-27 DIAGNOSIS — D631 Anemia in chronic kidney disease: Secondary | ICD-10-CM | POA: Diagnosis not present

## 2017-03-27 DIAGNOSIS — Z992 Dependence on renal dialysis: Secondary | ICD-10-CM | POA: Diagnosis not present

## 2017-03-27 DIAGNOSIS — N186 End stage renal disease: Secondary | ICD-10-CM | POA: Diagnosis not present

## 2017-03-27 DIAGNOSIS — N2581 Secondary hyperparathyroidism of renal origin: Secondary | ICD-10-CM | POA: Diagnosis not present

## 2017-03-27 DIAGNOSIS — E669 Obesity, unspecified: Secondary | ICD-10-CM | POA: Diagnosis not present

## 2017-03-29 DIAGNOSIS — D509 Iron deficiency anemia, unspecified: Secondary | ICD-10-CM | POA: Diagnosis not present

## 2017-03-29 DIAGNOSIS — N186 End stage renal disease: Secondary | ICD-10-CM | POA: Diagnosis not present

## 2017-03-29 DIAGNOSIS — E669 Obesity, unspecified: Secondary | ICD-10-CM | POA: Diagnosis not present

## 2017-03-29 DIAGNOSIS — N2581 Secondary hyperparathyroidism of renal origin: Secondary | ICD-10-CM | POA: Diagnosis not present

## 2017-03-29 DIAGNOSIS — D631 Anemia in chronic kidney disease: Secondary | ICD-10-CM | POA: Diagnosis not present

## 2017-03-29 DIAGNOSIS — Z992 Dependence on renal dialysis: Secondary | ICD-10-CM | POA: Diagnosis not present

## 2017-04-02 DIAGNOSIS — D509 Iron deficiency anemia, unspecified: Secondary | ICD-10-CM | POA: Diagnosis not present

## 2017-04-02 DIAGNOSIS — N2581 Secondary hyperparathyroidism of renal origin: Secondary | ICD-10-CM | POA: Diagnosis not present

## 2017-04-02 DIAGNOSIS — Z992 Dependence on renal dialysis: Secondary | ICD-10-CM | POA: Diagnosis not present

## 2017-04-02 DIAGNOSIS — N186 End stage renal disease: Secondary | ICD-10-CM | POA: Diagnosis not present

## 2017-04-02 DIAGNOSIS — D631 Anemia in chronic kidney disease: Secondary | ICD-10-CM | POA: Diagnosis not present

## 2017-04-02 DIAGNOSIS — E669 Obesity, unspecified: Secondary | ICD-10-CM | POA: Diagnosis not present

## 2017-04-03 DIAGNOSIS — N186 End stage renal disease: Secondary | ICD-10-CM | POA: Diagnosis not present

## 2017-04-03 DIAGNOSIS — Z992 Dependence on renal dialysis: Secondary | ICD-10-CM | POA: Diagnosis not present

## 2017-04-05 DIAGNOSIS — D631 Anemia in chronic kidney disease: Secondary | ICD-10-CM | POA: Diagnosis not present

## 2017-04-05 DIAGNOSIS — N2581 Secondary hyperparathyroidism of renal origin: Secondary | ICD-10-CM | POA: Diagnosis not present

## 2017-04-05 DIAGNOSIS — Z992 Dependence on renal dialysis: Secondary | ICD-10-CM | POA: Diagnosis not present

## 2017-04-05 DIAGNOSIS — D509 Iron deficiency anemia, unspecified: Secondary | ICD-10-CM | POA: Diagnosis not present

## 2017-04-05 DIAGNOSIS — N186 End stage renal disease: Secondary | ICD-10-CM | POA: Diagnosis not present

## 2017-04-07 DIAGNOSIS — D509 Iron deficiency anemia, unspecified: Secondary | ICD-10-CM | POA: Diagnosis not present

## 2017-04-07 DIAGNOSIS — N186 End stage renal disease: Secondary | ICD-10-CM | POA: Diagnosis not present

## 2017-04-07 DIAGNOSIS — Z992 Dependence on renal dialysis: Secondary | ICD-10-CM | POA: Diagnosis not present

## 2017-04-07 DIAGNOSIS — N2581 Secondary hyperparathyroidism of renal origin: Secondary | ICD-10-CM | POA: Diagnosis not present

## 2017-04-07 DIAGNOSIS — D631 Anemia in chronic kidney disease: Secondary | ICD-10-CM | POA: Diagnosis not present

## 2017-04-10 DIAGNOSIS — N2581 Secondary hyperparathyroidism of renal origin: Secondary | ICD-10-CM | POA: Diagnosis not present

## 2017-04-10 DIAGNOSIS — D509 Iron deficiency anemia, unspecified: Secondary | ICD-10-CM | POA: Diagnosis not present

## 2017-04-10 DIAGNOSIS — Z992 Dependence on renal dialysis: Secondary | ICD-10-CM | POA: Diagnosis not present

## 2017-04-10 DIAGNOSIS — N186 End stage renal disease: Secondary | ICD-10-CM | POA: Diagnosis not present

## 2017-04-10 DIAGNOSIS — D631 Anemia in chronic kidney disease: Secondary | ICD-10-CM | POA: Diagnosis not present

## 2017-04-12 DIAGNOSIS — D631 Anemia in chronic kidney disease: Secondary | ICD-10-CM | POA: Diagnosis not present

## 2017-04-12 DIAGNOSIS — N186 End stage renal disease: Secondary | ICD-10-CM | POA: Diagnosis not present

## 2017-04-12 DIAGNOSIS — N2581 Secondary hyperparathyroidism of renal origin: Secondary | ICD-10-CM | POA: Diagnosis not present

## 2017-04-12 DIAGNOSIS — Z992 Dependence on renal dialysis: Secondary | ICD-10-CM | POA: Diagnosis not present

## 2017-04-12 DIAGNOSIS — D509 Iron deficiency anemia, unspecified: Secondary | ICD-10-CM | POA: Diagnosis not present

## 2017-04-14 DIAGNOSIS — N2581 Secondary hyperparathyroidism of renal origin: Secondary | ICD-10-CM | POA: Diagnosis not present

## 2017-04-14 DIAGNOSIS — N186 End stage renal disease: Secondary | ICD-10-CM | POA: Diagnosis not present

## 2017-04-14 DIAGNOSIS — D631 Anemia in chronic kidney disease: Secondary | ICD-10-CM | POA: Diagnosis not present

## 2017-04-14 DIAGNOSIS — Z992 Dependence on renal dialysis: Secondary | ICD-10-CM | POA: Diagnosis not present

## 2017-04-14 DIAGNOSIS — D509 Iron deficiency anemia, unspecified: Secondary | ICD-10-CM | POA: Diagnosis not present

## 2017-04-17 DIAGNOSIS — N186 End stage renal disease: Secondary | ICD-10-CM | POA: Diagnosis not present

## 2017-04-17 DIAGNOSIS — N2581 Secondary hyperparathyroidism of renal origin: Secondary | ICD-10-CM | POA: Diagnosis not present

## 2017-04-17 DIAGNOSIS — Z992 Dependence on renal dialysis: Secondary | ICD-10-CM | POA: Diagnosis not present

## 2017-04-17 DIAGNOSIS — D631 Anemia in chronic kidney disease: Secondary | ICD-10-CM | POA: Diagnosis not present

## 2017-04-17 DIAGNOSIS — D509 Iron deficiency anemia, unspecified: Secondary | ICD-10-CM | POA: Diagnosis not present

## 2017-04-19 DIAGNOSIS — Z992 Dependence on renal dialysis: Secondary | ICD-10-CM | POA: Diagnosis not present

## 2017-04-19 DIAGNOSIS — D631 Anemia in chronic kidney disease: Secondary | ICD-10-CM | POA: Diagnosis not present

## 2017-04-19 DIAGNOSIS — N2581 Secondary hyperparathyroidism of renal origin: Secondary | ICD-10-CM | POA: Diagnosis not present

## 2017-04-19 DIAGNOSIS — D509 Iron deficiency anemia, unspecified: Secondary | ICD-10-CM | POA: Diagnosis not present

## 2017-04-19 DIAGNOSIS — N186 End stage renal disease: Secondary | ICD-10-CM | POA: Diagnosis not present

## 2017-04-21 DIAGNOSIS — Z992 Dependence on renal dialysis: Secondary | ICD-10-CM | POA: Diagnosis not present

## 2017-04-21 DIAGNOSIS — D631 Anemia in chronic kidney disease: Secondary | ICD-10-CM | POA: Diagnosis not present

## 2017-04-21 DIAGNOSIS — N2581 Secondary hyperparathyroidism of renal origin: Secondary | ICD-10-CM | POA: Diagnosis not present

## 2017-04-21 DIAGNOSIS — D509 Iron deficiency anemia, unspecified: Secondary | ICD-10-CM | POA: Diagnosis not present

## 2017-04-21 DIAGNOSIS — N186 End stage renal disease: Secondary | ICD-10-CM | POA: Diagnosis not present

## 2017-04-24 DIAGNOSIS — D509 Iron deficiency anemia, unspecified: Secondary | ICD-10-CM | POA: Diagnosis not present

## 2017-04-24 DIAGNOSIS — N186 End stage renal disease: Secondary | ICD-10-CM | POA: Diagnosis not present

## 2017-04-24 DIAGNOSIS — Z992 Dependence on renal dialysis: Secondary | ICD-10-CM | POA: Diagnosis not present

## 2017-04-24 DIAGNOSIS — D631 Anemia in chronic kidney disease: Secondary | ICD-10-CM | POA: Diagnosis not present

## 2017-04-24 DIAGNOSIS — N2581 Secondary hyperparathyroidism of renal origin: Secondary | ICD-10-CM | POA: Diagnosis not present

## 2017-04-28 DIAGNOSIS — N2581 Secondary hyperparathyroidism of renal origin: Secondary | ICD-10-CM | POA: Diagnosis not present

## 2017-04-28 DIAGNOSIS — D631 Anemia in chronic kidney disease: Secondary | ICD-10-CM | POA: Diagnosis not present

## 2017-04-28 DIAGNOSIS — Z992 Dependence on renal dialysis: Secondary | ICD-10-CM | POA: Diagnosis not present

## 2017-04-28 DIAGNOSIS — N186 End stage renal disease: Secondary | ICD-10-CM | POA: Diagnosis not present

## 2017-04-28 DIAGNOSIS — D509 Iron deficiency anemia, unspecified: Secondary | ICD-10-CM | POA: Diagnosis not present

## 2017-05-01 DIAGNOSIS — N2581 Secondary hyperparathyroidism of renal origin: Secondary | ICD-10-CM | POA: Diagnosis not present

## 2017-05-01 DIAGNOSIS — D509 Iron deficiency anemia, unspecified: Secondary | ICD-10-CM | POA: Diagnosis not present

## 2017-05-01 DIAGNOSIS — Z992 Dependence on renal dialysis: Secondary | ICD-10-CM | POA: Diagnosis not present

## 2017-05-01 DIAGNOSIS — D631 Anemia in chronic kidney disease: Secondary | ICD-10-CM | POA: Diagnosis not present

## 2017-05-01 DIAGNOSIS — N186 End stage renal disease: Secondary | ICD-10-CM | POA: Diagnosis not present

## 2017-05-03 DIAGNOSIS — N2581 Secondary hyperparathyroidism of renal origin: Secondary | ICD-10-CM | POA: Diagnosis not present

## 2017-05-03 DIAGNOSIS — D631 Anemia in chronic kidney disease: Secondary | ICD-10-CM | POA: Diagnosis not present

## 2017-05-03 DIAGNOSIS — D509 Iron deficiency anemia, unspecified: Secondary | ICD-10-CM | POA: Diagnosis not present

## 2017-05-03 DIAGNOSIS — N186 End stage renal disease: Secondary | ICD-10-CM | POA: Diagnosis not present

## 2017-05-03 DIAGNOSIS — Z992 Dependence on renal dialysis: Secondary | ICD-10-CM | POA: Diagnosis not present

## 2017-05-04 DIAGNOSIS — N186 End stage renal disease: Secondary | ICD-10-CM | POA: Diagnosis not present

## 2017-05-04 DIAGNOSIS — Z992 Dependence on renal dialysis: Secondary | ICD-10-CM | POA: Diagnosis not present

## 2017-05-05 DIAGNOSIS — D631 Anemia in chronic kidney disease: Secondary | ICD-10-CM | POA: Diagnosis not present

## 2017-05-05 DIAGNOSIS — Z992 Dependence on renal dialysis: Secondary | ICD-10-CM | POA: Diagnosis not present

## 2017-05-05 DIAGNOSIS — N2581 Secondary hyperparathyroidism of renal origin: Secondary | ICD-10-CM | POA: Diagnosis not present

## 2017-05-05 DIAGNOSIS — N186 End stage renal disease: Secondary | ICD-10-CM | POA: Diagnosis not present

## 2017-05-05 DIAGNOSIS — D509 Iron deficiency anemia, unspecified: Secondary | ICD-10-CM | POA: Diagnosis not present

## 2017-05-08 DIAGNOSIS — D631 Anemia in chronic kidney disease: Secondary | ICD-10-CM | POA: Diagnosis not present

## 2017-05-08 DIAGNOSIS — Z992 Dependence on renal dialysis: Secondary | ICD-10-CM | POA: Diagnosis not present

## 2017-05-08 DIAGNOSIS — N186 End stage renal disease: Secondary | ICD-10-CM | POA: Diagnosis not present

## 2017-05-08 DIAGNOSIS — D509 Iron deficiency anemia, unspecified: Secondary | ICD-10-CM | POA: Diagnosis not present

## 2017-05-08 DIAGNOSIS — N2581 Secondary hyperparathyroidism of renal origin: Secondary | ICD-10-CM | POA: Diagnosis not present

## 2017-05-10 DIAGNOSIS — N2581 Secondary hyperparathyroidism of renal origin: Secondary | ICD-10-CM | POA: Diagnosis not present

## 2017-05-10 DIAGNOSIS — Z992 Dependence on renal dialysis: Secondary | ICD-10-CM | POA: Diagnosis not present

## 2017-05-10 DIAGNOSIS — N186 End stage renal disease: Secondary | ICD-10-CM | POA: Diagnosis not present

## 2017-05-10 DIAGNOSIS — D631 Anemia in chronic kidney disease: Secondary | ICD-10-CM | POA: Diagnosis not present

## 2017-05-10 DIAGNOSIS — D509 Iron deficiency anemia, unspecified: Secondary | ICD-10-CM | POA: Diagnosis not present

## 2017-05-12 DIAGNOSIS — N186 End stage renal disease: Secondary | ICD-10-CM | POA: Diagnosis not present

## 2017-05-12 DIAGNOSIS — Z992 Dependence on renal dialysis: Secondary | ICD-10-CM | POA: Diagnosis not present

## 2017-05-12 DIAGNOSIS — N2581 Secondary hyperparathyroidism of renal origin: Secondary | ICD-10-CM | POA: Diagnosis not present

## 2017-05-12 DIAGNOSIS — D631 Anemia in chronic kidney disease: Secondary | ICD-10-CM | POA: Diagnosis not present

## 2017-05-12 DIAGNOSIS — D509 Iron deficiency anemia, unspecified: Secondary | ICD-10-CM | POA: Diagnosis not present

## 2017-05-15 DIAGNOSIS — D509 Iron deficiency anemia, unspecified: Secondary | ICD-10-CM | POA: Diagnosis not present

## 2017-05-15 DIAGNOSIS — D631 Anemia in chronic kidney disease: Secondary | ICD-10-CM | POA: Diagnosis not present

## 2017-05-15 DIAGNOSIS — N186 End stage renal disease: Secondary | ICD-10-CM | POA: Diagnosis not present

## 2017-05-15 DIAGNOSIS — N2581 Secondary hyperparathyroidism of renal origin: Secondary | ICD-10-CM | POA: Diagnosis not present

## 2017-05-15 DIAGNOSIS — Z992 Dependence on renal dialysis: Secondary | ICD-10-CM | POA: Diagnosis not present

## 2017-05-17 DIAGNOSIS — Z992 Dependence on renal dialysis: Secondary | ICD-10-CM | POA: Diagnosis not present

## 2017-05-17 DIAGNOSIS — D509 Iron deficiency anemia, unspecified: Secondary | ICD-10-CM | POA: Diagnosis not present

## 2017-05-17 DIAGNOSIS — N186 End stage renal disease: Secondary | ICD-10-CM | POA: Diagnosis not present

## 2017-05-17 DIAGNOSIS — D631 Anemia in chronic kidney disease: Secondary | ICD-10-CM | POA: Diagnosis not present

## 2017-05-17 DIAGNOSIS — N2581 Secondary hyperparathyroidism of renal origin: Secondary | ICD-10-CM | POA: Diagnosis not present

## 2017-05-19 DIAGNOSIS — Z992 Dependence on renal dialysis: Secondary | ICD-10-CM | POA: Diagnosis not present

## 2017-05-19 DIAGNOSIS — N2581 Secondary hyperparathyroidism of renal origin: Secondary | ICD-10-CM | POA: Diagnosis not present

## 2017-05-19 DIAGNOSIS — N186 End stage renal disease: Secondary | ICD-10-CM | POA: Diagnosis not present

## 2017-05-19 DIAGNOSIS — D631 Anemia in chronic kidney disease: Secondary | ICD-10-CM | POA: Diagnosis not present

## 2017-05-19 DIAGNOSIS — D509 Iron deficiency anemia, unspecified: Secondary | ICD-10-CM | POA: Diagnosis not present

## 2017-05-22 DIAGNOSIS — E119 Type 2 diabetes mellitus without complications: Secondary | ICD-10-CM | POA: Diagnosis not present

## 2017-05-22 DIAGNOSIS — Z992 Dependence on renal dialysis: Secondary | ICD-10-CM | POA: Diagnosis not present

## 2017-05-22 DIAGNOSIS — D631 Anemia in chronic kidney disease: Secondary | ICD-10-CM | POA: Diagnosis not present

## 2017-05-22 DIAGNOSIS — N2581 Secondary hyperparathyroidism of renal origin: Secondary | ICD-10-CM | POA: Diagnosis not present

## 2017-05-22 DIAGNOSIS — N186 End stage renal disease: Secondary | ICD-10-CM | POA: Diagnosis not present

## 2017-05-22 DIAGNOSIS — Z794 Long term (current) use of insulin: Secondary | ICD-10-CM | POA: Diagnosis not present

## 2017-05-22 DIAGNOSIS — D509 Iron deficiency anemia, unspecified: Secondary | ICD-10-CM | POA: Diagnosis not present

## 2017-05-24 DIAGNOSIS — D631 Anemia in chronic kidney disease: Secondary | ICD-10-CM | POA: Diagnosis not present

## 2017-05-24 DIAGNOSIS — Z992 Dependence on renal dialysis: Secondary | ICD-10-CM | POA: Diagnosis not present

## 2017-05-24 DIAGNOSIS — D509 Iron deficiency anemia, unspecified: Secondary | ICD-10-CM | POA: Diagnosis not present

## 2017-05-24 DIAGNOSIS — N186 End stage renal disease: Secondary | ICD-10-CM | POA: Diagnosis not present

## 2017-05-24 DIAGNOSIS — N2581 Secondary hyperparathyroidism of renal origin: Secondary | ICD-10-CM | POA: Diagnosis not present

## 2017-05-26 DIAGNOSIS — Z992 Dependence on renal dialysis: Secondary | ICD-10-CM | POA: Diagnosis not present

## 2017-05-26 DIAGNOSIS — N2581 Secondary hyperparathyroidism of renal origin: Secondary | ICD-10-CM | POA: Diagnosis not present

## 2017-05-26 DIAGNOSIS — N186 End stage renal disease: Secondary | ICD-10-CM | POA: Diagnosis not present

## 2017-05-26 DIAGNOSIS — D509 Iron deficiency anemia, unspecified: Secondary | ICD-10-CM | POA: Diagnosis not present

## 2017-05-26 DIAGNOSIS — D631 Anemia in chronic kidney disease: Secondary | ICD-10-CM | POA: Diagnosis not present

## 2017-05-28 DIAGNOSIS — Z992 Dependence on renal dialysis: Secondary | ICD-10-CM | POA: Diagnosis not present

## 2017-05-28 DIAGNOSIS — N2581 Secondary hyperparathyroidism of renal origin: Secondary | ICD-10-CM | POA: Diagnosis not present

## 2017-05-28 DIAGNOSIS — D631 Anemia in chronic kidney disease: Secondary | ICD-10-CM | POA: Diagnosis not present

## 2017-05-28 DIAGNOSIS — D509 Iron deficiency anemia, unspecified: Secondary | ICD-10-CM | POA: Diagnosis not present

## 2017-05-28 DIAGNOSIS — N186 End stage renal disease: Secondary | ICD-10-CM | POA: Diagnosis not present

## 2017-06-02 DIAGNOSIS — D631 Anemia in chronic kidney disease: Secondary | ICD-10-CM | POA: Diagnosis not present

## 2017-06-02 DIAGNOSIS — Z992 Dependence on renal dialysis: Secondary | ICD-10-CM | POA: Diagnosis not present

## 2017-06-02 DIAGNOSIS — D509 Iron deficiency anemia, unspecified: Secondary | ICD-10-CM | POA: Diagnosis not present

## 2017-06-02 DIAGNOSIS — N186 End stage renal disease: Secondary | ICD-10-CM | POA: Diagnosis not present

## 2017-06-02 DIAGNOSIS — N2581 Secondary hyperparathyroidism of renal origin: Secondary | ICD-10-CM | POA: Diagnosis not present

## 2017-06-04 DIAGNOSIS — Z992 Dependence on renal dialysis: Secondary | ICD-10-CM | POA: Diagnosis not present

## 2017-06-04 DIAGNOSIS — D509 Iron deficiency anemia, unspecified: Secondary | ICD-10-CM | POA: Diagnosis not present

## 2017-06-04 DIAGNOSIS — N186 End stage renal disease: Secondary | ICD-10-CM | POA: Diagnosis not present

## 2017-06-04 DIAGNOSIS — D631 Anemia in chronic kidney disease: Secondary | ICD-10-CM | POA: Diagnosis not present

## 2017-06-04 DIAGNOSIS — N2581 Secondary hyperparathyroidism of renal origin: Secondary | ICD-10-CM | POA: Diagnosis not present

## 2017-06-07 DIAGNOSIS — N186 End stage renal disease: Secondary | ICD-10-CM | POA: Diagnosis not present

## 2017-06-07 DIAGNOSIS — D631 Anemia in chronic kidney disease: Secondary | ICD-10-CM | POA: Diagnosis not present

## 2017-06-07 DIAGNOSIS — N2581 Secondary hyperparathyroidism of renal origin: Secondary | ICD-10-CM | POA: Diagnosis not present

## 2017-06-07 DIAGNOSIS — Z992 Dependence on renal dialysis: Secondary | ICD-10-CM | POA: Diagnosis not present

## 2017-06-07 DIAGNOSIS — D509 Iron deficiency anemia, unspecified: Secondary | ICD-10-CM | POA: Diagnosis not present

## 2017-06-09 DIAGNOSIS — D631 Anemia in chronic kidney disease: Secondary | ICD-10-CM | POA: Diagnosis not present

## 2017-06-09 DIAGNOSIS — N186 End stage renal disease: Secondary | ICD-10-CM | POA: Diagnosis not present

## 2017-06-09 DIAGNOSIS — Z992 Dependence on renal dialysis: Secondary | ICD-10-CM | POA: Diagnosis not present

## 2017-06-09 DIAGNOSIS — N2581 Secondary hyperparathyroidism of renal origin: Secondary | ICD-10-CM | POA: Diagnosis not present

## 2017-06-09 DIAGNOSIS — D509 Iron deficiency anemia, unspecified: Secondary | ICD-10-CM | POA: Diagnosis not present

## 2017-06-12 DIAGNOSIS — Z992 Dependence on renal dialysis: Secondary | ICD-10-CM | POA: Diagnosis not present

## 2017-06-12 DIAGNOSIS — N2581 Secondary hyperparathyroidism of renal origin: Secondary | ICD-10-CM | POA: Diagnosis not present

## 2017-06-12 DIAGNOSIS — N186 End stage renal disease: Secondary | ICD-10-CM | POA: Diagnosis not present

## 2017-06-12 DIAGNOSIS — D631 Anemia in chronic kidney disease: Secondary | ICD-10-CM | POA: Diagnosis not present

## 2017-06-12 DIAGNOSIS — D509 Iron deficiency anemia, unspecified: Secondary | ICD-10-CM | POA: Diagnosis not present

## 2017-06-14 DIAGNOSIS — N2581 Secondary hyperparathyroidism of renal origin: Secondary | ICD-10-CM | POA: Diagnosis not present

## 2017-06-14 DIAGNOSIS — D631 Anemia in chronic kidney disease: Secondary | ICD-10-CM | POA: Diagnosis not present

## 2017-06-14 DIAGNOSIS — N186 End stage renal disease: Secondary | ICD-10-CM | POA: Diagnosis not present

## 2017-06-14 DIAGNOSIS — D509 Iron deficiency anemia, unspecified: Secondary | ICD-10-CM | POA: Diagnosis not present

## 2017-06-14 DIAGNOSIS — Z992 Dependence on renal dialysis: Secondary | ICD-10-CM | POA: Diagnosis not present

## 2017-06-16 DIAGNOSIS — N186 End stage renal disease: Secondary | ICD-10-CM | POA: Diagnosis not present

## 2017-06-16 DIAGNOSIS — N2581 Secondary hyperparathyroidism of renal origin: Secondary | ICD-10-CM | POA: Diagnosis not present

## 2017-06-16 DIAGNOSIS — Z992 Dependence on renal dialysis: Secondary | ICD-10-CM | POA: Diagnosis not present

## 2017-06-16 DIAGNOSIS — D509 Iron deficiency anemia, unspecified: Secondary | ICD-10-CM | POA: Diagnosis not present

## 2017-06-16 DIAGNOSIS — D631 Anemia in chronic kidney disease: Secondary | ICD-10-CM | POA: Diagnosis not present

## 2017-06-19 DIAGNOSIS — N2581 Secondary hyperparathyroidism of renal origin: Secondary | ICD-10-CM | POA: Diagnosis not present

## 2017-06-19 DIAGNOSIS — D631 Anemia in chronic kidney disease: Secondary | ICD-10-CM | POA: Diagnosis not present

## 2017-06-19 DIAGNOSIS — Z992 Dependence on renal dialysis: Secondary | ICD-10-CM | POA: Diagnosis not present

## 2017-06-19 DIAGNOSIS — D509 Iron deficiency anemia, unspecified: Secondary | ICD-10-CM | POA: Diagnosis not present

## 2017-06-19 DIAGNOSIS — N186 End stage renal disease: Secondary | ICD-10-CM | POA: Diagnosis not present

## 2017-06-21 DIAGNOSIS — D509 Iron deficiency anemia, unspecified: Secondary | ICD-10-CM | POA: Diagnosis not present

## 2017-06-21 DIAGNOSIS — Z992 Dependence on renal dialysis: Secondary | ICD-10-CM | POA: Diagnosis not present

## 2017-06-21 DIAGNOSIS — D631 Anemia in chronic kidney disease: Secondary | ICD-10-CM | POA: Diagnosis not present

## 2017-06-21 DIAGNOSIS — N2581 Secondary hyperparathyroidism of renal origin: Secondary | ICD-10-CM | POA: Diagnosis not present

## 2017-06-21 DIAGNOSIS — N186 End stage renal disease: Secondary | ICD-10-CM | POA: Diagnosis not present

## 2017-06-23 DIAGNOSIS — D509 Iron deficiency anemia, unspecified: Secondary | ICD-10-CM | POA: Diagnosis not present

## 2017-06-23 DIAGNOSIS — D631 Anemia in chronic kidney disease: Secondary | ICD-10-CM | POA: Diagnosis not present

## 2017-06-23 DIAGNOSIS — N186 End stage renal disease: Secondary | ICD-10-CM | POA: Diagnosis not present

## 2017-06-23 DIAGNOSIS — Z992 Dependence on renal dialysis: Secondary | ICD-10-CM | POA: Diagnosis not present

## 2017-06-23 DIAGNOSIS — N2581 Secondary hyperparathyroidism of renal origin: Secondary | ICD-10-CM | POA: Diagnosis not present

## 2017-06-26 DIAGNOSIS — D631 Anemia in chronic kidney disease: Secondary | ICD-10-CM | POA: Diagnosis not present

## 2017-06-26 DIAGNOSIS — Z992 Dependence on renal dialysis: Secondary | ICD-10-CM | POA: Diagnosis not present

## 2017-06-26 DIAGNOSIS — D509 Iron deficiency anemia, unspecified: Secondary | ICD-10-CM | POA: Diagnosis not present

## 2017-06-26 DIAGNOSIS — N2581 Secondary hyperparathyroidism of renal origin: Secondary | ICD-10-CM | POA: Diagnosis not present

## 2017-06-26 DIAGNOSIS — N186 End stage renal disease: Secondary | ICD-10-CM | POA: Diagnosis not present

## 2017-06-28 DIAGNOSIS — N186 End stage renal disease: Secondary | ICD-10-CM | POA: Diagnosis not present

## 2017-06-28 DIAGNOSIS — D509 Iron deficiency anemia, unspecified: Secondary | ICD-10-CM | POA: Diagnosis not present

## 2017-06-28 DIAGNOSIS — D631 Anemia in chronic kidney disease: Secondary | ICD-10-CM | POA: Diagnosis not present

## 2017-06-28 DIAGNOSIS — N2581 Secondary hyperparathyroidism of renal origin: Secondary | ICD-10-CM | POA: Diagnosis not present

## 2017-06-28 DIAGNOSIS — Z992 Dependence on renal dialysis: Secondary | ICD-10-CM | POA: Diagnosis not present

## 2017-06-30 DIAGNOSIS — D509 Iron deficiency anemia, unspecified: Secondary | ICD-10-CM | POA: Diagnosis not present

## 2017-06-30 DIAGNOSIS — Z992 Dependence on renal dialysis: Secondary | ICD-10-CM | POA: Diagnosis not present

## 2017-06-30 DIAGNOSIS — N186 End stage renal disease: Secondary | ICD-10-CM | POA: Diagnosis not present

## 2017-06-30 DIAGNOSIS — N2581 Secondary hyperparathyroidism of renal origin: Secondary | ICD-10-CM | POA: Diagnosis not present

## 2017-06-30 DIAGNOSIS — D631 Anemia in chronic kidney disease: Secondary | ICD-10-CM | POA: Diagnosis not present

## 2017-07-03 DIAGNOSIS — Z992 Dependence on renal dialysis: Secondary | ICD-10-CM | POA: Diagnosis not present

## 2017-07-03 DIAGNOSIS — N186 End stage renal disease: Secondary | ICD-10-CM | POA: Diagnosis not present

## 2017-07-03 DIAGNOSIS — D509 Iron deficiency anemia, unspecified: Secondary | ICD-10-CM | POA: Diagnosis not present

## 2017-07-03 DIAGNOSIS — N2581 Secondary hyperparathyroidism of renal origin: Secondary | ICD-10-CM | POA: Diagnosis not present

## 2017-07-03 DIAGNOSIS — D631 Anemia in chronic kidney disease: Secondary | ICD-10-CM | POA: Diagnosis not present

## 2017-07-05 DIAGNOSIS — N186 End stage renal disease: Secondary | ICD-10-CM | POA: Diagnosis not present

## 2017-07-05 DIAGNOSIS — Z992 Dependence on renal dialysis: Secondary | ICD-10-CM | POA: Diagnosis not present

## 2017-07-07 DIAGNOSIS — Z992 Dependence on renal dialysis: Secondary | ICD-10-CM | POA: Diagnosis not present

## 2017-07-07 DIAGNOSIS — N2581 Secondary hyperparathyroidism of renal origin: Secondary | ICD-10-CM | POA: Diagnosis not present

## 2017-07-07 DIAGNOSIS — N186 End stage renal disease: Secondary | ICD-10-CM | POA: Diagnosis not present

## 2017-07-07 DIAGNOSIS — D509 Iron deficiency anemia, unspecified: Secondary | ICD-10-CM | POA: Diagnosis not present

## 2017-07-07 DIAGNOSIS — D631 Anemia in chronic kidney disease: Secondary | ICD-10-CM | POA: Diagnosis not present

## 2017-07-10 DIAGNOSIS — D631 Anemia in chronic kidney disease: Secondary | ICD-10-CM | POA: Diagnosis not present

## 2017-07-10 DIAGNOSIS — N2581 Secondary hyperparathyroidism of renal origin: Secondary | ICD-10-CM | POA: Diagnosis not present

## 2017-07-10 DIAGNOSIS — N186 End stage renal disease: Secondary | ICD-10-CM | POA: Diagnosis not present

## 2017-07-10 DIAGNOSIS — Z992 Dependence on renal dialysis: Secondary | ICD-10-CM | POA: Diagnosis not present

## 2017-07-10 DIAGNOSIS — D509 Iron deficiency anemia, unspecified: Secondary | ICD-10-CM | POA: Diagnosis not present

## 2017-07-13 DIAGNOSIS — Z299 Encounter for prophylactic measures, unspecified: Secondary | ICD-10-CM | POA: Diagnosis not present

## 2017-07-13 DIAGNOSIS — Z6829 Body mass index (BMI) 29.0-29.9, adult: Secondary | ICD-10-CM | POA: Diagnosis not present

## 2017-07-13 DIAGNOSIS — J329 Chronic sinusitis, unspecified: Secondary | ICD-10-CM | POA: Diagnosis not present

## 2017-07-13 DIAGNOSIS — Z789 Other specified health status: Secondary | ICD-10-CM | POA: Diagnosis not present

## 2017-07-13 DIAGNOSIS — I10 Essential (primary) hypertension: Secondary | ICD-10-CM | POA: Diagnosis not present

## 2017-07-13 DIAGNOSIS — E1122 Type 2 diabetes mellitus with diabetic chronic kidney disease: Secondary | ICD-10-CM | POA: Diagnosis not present

## 2017-07-14 DIAGNOSIS — N186 End stage renal disease: Secondary | ICD-10-CM | POA: Diagnosis not present

## 2017-07-14 DIAGNOSIS — Z992 Dependence on renal dialysis: Secondary | ICD-10-CM | POA: Diagnosis not present

## 2017-07-14 DIAGNOSIS — N2581 Secondary hyperparathyroidism of renal origin: Secondary | ICD-10-CM | POA: Diagnosis not present

## 2017-07-14 DIAGNOSIS — D631 Anemia in chronic kidney disease: Secondary | ICD-10-CM | POA: Diagnosis not present

## 2017-07-14 DIAGNOSIS — D509 Iron deficiency anemia, unspecified: Secondary | ICD-10-CM | POA: Diagnosis not present

## 2017-07-17 DIAGNOSIS — D631 Anemia in chronic kidney disease: Secondary | ICD-10-CM | POA: Diagnosis not present

## 2017-07-17 DIAGNOSIS — N2581 Secondary hyperparathyroidism of renal origin: Secondary | ICD-10-CM | POA: Diagnosis not present

## 2017-07-17 DIAGNOSIS — N186 End stage renal disease: Secondary | ICD-10-CM | POA: Diagnosis not present

## 2017-07-17 DIAGNOSIS — D509 Iron deficiency anemia, unspecified: Secondary | ICD-10-CM | POA: Diagnosis not present

## 2017-07-17 DIAGNOSIS — Z992 Dependence on renal dialysis: Secondary | ICD-10-CM | POA: Diagnosis not present

## 2017-07-19 DIAGNOSIS — Z992 Dependence on renal dialysis: Secondary | ICD-10-CM | POA: Diagnosis not present

## 2017-07-19 DIAGNOSIS — N186 End stage renal disease: Secondary | ICD-10-CM | POA: Diagnosis not present

## 2017-07-19 DIAGNOSIS — N2581 Secondary hyperparathyroidism of renal origin: Secondary | ICD-10-CM | POA: Diagnosis not present

## 2017-07-19 DIAGNOSIS — D631 Anemia in chronic kidney disease: Secondary | ICD-10-CM | POA: Diagnosis not present

## 2017-07-19 DIAGNOSIS — D509 Iron deficiency anemia, unspecified: Secondary | ICD-10-CM | POA: Diagnosis not present

## 2017-07-21 DIAGNOSIS — N186 End stage renal disease: Secondary | ICD-10-CM | POA: Diagnosis not present

## 2017-07-21 DIAGNOSIS — Z992 Dependence on renal dialysis: Secondary | ICD-10-CM | POA: Diagnosis not present

## 2017-07-21 DIAGNOSIS — D509 Iron deficiency anemia, unspecified: Secondary | ICD-10-CM | POA: Diagnosis not present

## 2017-07-21 DIAGNOSIS — N2581 Secondary hyperparathyroidism of renal origin: Secondary | ICD-10-CM | POA: Diagnosis not present

## 2017-07-21 DIAGNOSIS — D631 Anemia in chronic kidney disease: Secondary | ICD-10-CM | POA: Diagnosis not present

## 2017-07-24 DIAGNOSIS — D509 Iron deficiency anemia, unspecified: Secondary | ICD-10-CM | POA: Diagnosis not present

## 2017-07-24 DIAGNOSIS — N186 End stage renal disease: Secondary | ICD-10-CM | POA: Diagnosis not present

## 2017-07-24 DIAGNOSIS — Z992 Dependence on renal dialysis: Secondary | ICD-10-CM | POA: Diagnosis not present

## 2017-07-24 DIAGNOSIS — N2581 Secondary hyperparathyroidism of renal origin: Secondary | ICD-10-CM | POA: Diagnosis not present

## 2017-07-24 DIAGNOSIS — D631 Anemia in chronic kidney disease: Secondary | ICD-10-CM | POA: Diagnosis not present

## 2017-07-26 DIAGNOSIS — D509 Iron deficiency anemia, unspecified: Secondary | ICD-10-CM | POA: Diagnosis not present

## 2017-07-26 DIAGNOSIS — Z992 Dependence on renal dialysis: Secondary | ICD-10-CM | POA: Diagnosis not present

## 2017-07-26 DIAGNOSIS — N2581 Secondary hyperparathyroidism of renal origin: Secondary | ICD-10-CM | POA: Diagnosis not present

## 2017-07-26 DIAGNOSIS — D631 Anemia in chronic kidney disease: Secondary | ICD-10-CM | POA: Diagnosis not present

## 2017-07-26 DIAGNOSIS — N186 End stage renal disease: Secondary | ICD-10-CM | POA: Diagnosis not present

## 2017-07-28 DIAGNOSIS — Z992 Dependence on renal dialysis: Secondary | ICD-10-CM | POA: Diagnosis not present

## 2017-07-28 DIAGNOSIS — D509 Iron deficiency anemia, unspecified: Secondary | ICD-10-CM | POA: Diagnosis not present

## 2017-07-28 DIAGNOSIS — N186 End stage renal disease: Secondary | ICD-10-CM | POA: Diagnosis not present

## 2017-07-28 DIAGNOSIS — D631 Anemia in chronic kidney disease: Secondary | ICD-10-CM | POA: Diagnosis not present

## 2017-07-28 DIAGNOSIS — N2581 Secondary hyperparathyroidism of renal origin: Secondary | ICD-10-CM | POA: Diagnosis not present

## 2017-07-31 DIAGNOSIS — Z992 Dependence on renal dialysis: Secondary | ICD-10-CM | POA: Diagnosis not present

## 2017-07-31 DIAGNOSIS — D509 Iron deficiency anemia, unspecified: Secondary | ICD-10-CM | POA: Diagnosis not present

## 2017-07-31 DIAGNOSIS — D631 Anemia in chronic kidney disease: Secondary | ICD-10-CM | POA: Diagnosis not present

## 2017-07-31 DIAGNOSIS — N186 End stage renal disease: Secondary | ICD-10-CM | POA: Diagnosis not present

## 2017-07-31 DIAGNOSIS — N2581 Secondary hyperparathyroidism of renal origin: Secondary | ICD-10-CM | POA: Diagnosis not present

## 2017-08-02 DIAGNOSIS — N186 End stage renal disease: Secondary | ICD-10-CM | POA: Diagnosis not present

## 2017-08-02 DIAGNOSIS — D509 Iron deficiency anemia, unspecified: Secondary | ICD-10-CM | POA: Diagnosis not present

## 2017-08-02 DIAGNOSIS — N2581 Secondary hyperparathyroidism of renal origin: Secondary | ICD-10-CM | POA: Diagnosis not present

## 2017-08-02 DIAGNOSIS — Z992 Dependence on renal dialysis: Secondary | ICD-10-CM | POA: Diagnosis not present

## 2017-08-02 DIAGNOSIS — D631 Anemia in chronic kidney disease: Secondary | ICD-10-CM | POA: Diagnosis not present

## 2017-08-04 DIAGNOSIS — N186 End stage renal disease: Secondary | ICD-10-CM | POA: Diagnosis not present

## 2017-08-04 DIAGNOSIS — Z992 Dependence on renal dialysis: Secondary | ICD-10-CM | POA: Diagnosis not present

## 2017-08-04 DIAGNOSIS — D631 Anemia in chronic kidney disease: Secondary | ICD-10-CM | POA: Diagnosis not present

## 2017-08-04 DIAGNOSIS — D509 Iron deficiency anemia, unspecified: Secondary | ICD-10-CM | POA: Diagnosis not present

## 2017-08-04 DIAGNOSIS — N2581 Secondary hyperparathyroidism of renal origin: Secondary | ICD-10-CM | POA: Diagnosis not present

## 2017-08-07 DIAGNOSIS — N2581 Secondary hyperparathyroidism of renal origin: Secondary | ICD-10-CM | POA: Diagnosis not present

## 2017-08-07 DIAGNOSIS — N186 End stage renal disease: Secondary | ICD-10-CM | POA: Diagnosis not present

## 2017-08-07 DIAGNOSIS — D509 Iron deficiency anemia, unspecified: Secondary | ICD-10-CM | POA: Diagnosis not present

## 2017-08-07 DIAGNOSIS — D631 Anemia in chronic kidney disease: Secondary | ICD-10-CM | POA: Diagnosis not present

## 2017-08-07 DIAGNOSIS — Z992 Dependence on renal dialysis: Secondary | ICD-10-CM | POA: Diagnosis not present

## 2017-08-09 DIAGNOSIS — N2581 Secondary hyperparathyroidism of renal origin: Secondary | ICD-10-CM | POA: Diagnosis not present

## 2017-08-09 DIAGNOSIS — N186 End stage renal disease: Secondary | ICD-10-CM | POA: Diagnosis not present

## 2017-08-09 DIAGNOSIS — D509 Iron deficiency anemia, unspecified: Secondary | ICD-10-CM | POA: Diagnosis not present

## 2017-08-09 DIAGNOSIS — Z992 Dependence on renal dialysis: Secondary | ICD-10-CM | POA: Diagnosis not present

## 2017-08-09 DIAGNOSIS — D631 Anemia in chronic kidney disease: Secondary | ICD-10-CM | POA: Diagnosis not present

## 2017-08-11 DIAGNOSIS — N2581 Secondary hyperparathyroidism of renal origin: Secondary | ICD-10-CM | POA: Diagnosis not present

## 2017-08-11 DIAGNOSIS — D631 Anemia in chronic kidney disease: Secondary | ICD-10-CM | POA: Diagnosis not present

## 2017-08-11 DIAGNOSIS — Z992 Dependence on renal dialysis: Secondary | ICD-10-CM | POA: Diagnosis not present

## 2017-08-11 DIAGNOSIS — D509 Iron deficiency anemia, unspecified: Secondary | ICD-10-CM | POA: Diagnosis not present

## 2017-08-11 DIAGNOSIS — N186 End stage renal disease: Secondary | ICD-10-CM | POA: Diagnosis not present

## 2017-08-14 DIAGNOSIS — D631 Anemia in chronic kidney disease: Secondary | ICD-10-CM | POA: Diagnosis not present

## 2017-08-14 DIAGNOSIS — Z992 Dependence on renal dialysis: Secondary | ICD-10-CM | POA: Diagnosis not present

## 2017-08-14 DIAGNOSIS — N2581 Secondary hyperparathyroidism of renal origin: Secondary | ICD-10-CM | POA: Diagnosis not present

## 2017-08-14 DIAGNOSIS — D509 Iron deficiency anemia, unspecified: Secondary | ICD-10-CM | POA: Diagnosis not present

## 2017-08-14 DIAGNOSIS — N186 End stage renal disease: Secondary | ICD-10-CM | POA: Diagnosis not present

## 2017-08-16 DIAGNOSIS — N2581 Secondary hyperparathyroidism of renal origin: Secondary | ICD-10-CM | POA: Diagnosis not present

## 2017-08-16 DIAGNOSIS — D631 Anemia in chronic kidney disease: Secondary | ICD-10-CM | POA: Diagnosis not present

## 2017-08-16 DIAGNOSIS — D509 Iron deficiency anemia, unspecified: Secondary | ICD-10-CM | POA: Diagnosis not present

## 2017-08-16 DIAGNOSIS — N186 End stage renal disease: Secondary | ICD-10-CM | POA: Diagnosis not present

## 2017-08-16 DIAGNOSIS — Z992 Dependence on renal dialysis: Secondary | ICD-10-CM | POA: Diagnosis not present

## 2017-08-18 DIAGNOSIS — D631 Anemia in chronic kidney disease: Secondary | ICD-10-CM | POA: Diagnosis not present

## 2017-08-18 DIAGNOSIS — D509 Iron deficiency anemia, unspecified: Secondary | ICD-10-CM | POA: Diagnosis not present

## 2017-08-18 DIAGNOSIS — N186 End stage renal disease: Secondary | ICD-10-CM | POA: Diagnosis not present

## 2017-08-18 DIAGNOSIS — Z992 Dependence on renal dialysis: Secondary | ICD-10-CM | POA: Diagnosis not present

## 2017-08-18 DIAGNOSIS — N2581 Secondary hyperparathyroidism of renal origin: Secondary | ICD-10-CM | POA: Diagnosis not present

## 2017-08-21 DIAGNOSIS — N186 End stage renal disease: Secondary | ICD-10-CM | POA: Diagnosis not present

## 2017-08-21 DIAGNOSIS — D631 Anemia in chronic kidney disease: Secondary | ICD-10-CM | POA: Diagnosis not present

## 2017-08-21 DIAGNOSIS — Z992 Dependence on renal dialysis: Secondary | ICD-10-CM | POA: Diagnosis not present

## 2017-08-21 DIAGNOSIS — N2581 Secondary hyperparathyroidism of renal origin: Secondary | ICD-10-CM | POA: Diagnosis not present

## 2017-08-21 DIAGNOSIS — D509 Iron deficiency anemia, unspecified: Secondary | ICD-10-CM | POA: Diagnosis not present

## 2017-08-25 DIAGNOSIS — Z992 Dependence on renal dialysis: Secondary | ICD-10-CM | POA: Diagnosis not present

## 2017-08-25 DIAGNOSIS — N2581 Secondary hyperparathyroidism of renal origin: Secondary | ICD-10-CM | POA: Diagnosis not present

## 2017-08-25 DIAGNOSIS — D631 Anemia in chronic kidney disease: Secondary | ICD-10-CM | POA: Diagnosis not present

## 2017-08-25 DIAGNOSIS — N186 End stage renal disease: Secondary | ICD-10-CM | POA: Diagnosis not present

## 2017-08-25 DIAGNOSIS — D509 Iron deficiency anemia, unspecified: Secondary | ICD-10-CM | POA: Diagnosis not present

## 2017-08-28 DIAGNOSIS — N2581 Secondary hyperparathyroidism of renal origin: Secondary | ICD-10-CM | POA: Diagnosis not present

## 2017-08-28 DIAGNOSIS — Z992 Dependence on renal dialysis: Secondary | ICD-10-CM | POA: Diagnosis not present

## 2017-08-28 DIAGNOSIS — N186 End stage renal disease: Secondary | ICD-10-CM | POA: Diagnosis not present

## 2017-08-28 DIAGNOSIS — Z79899 Other long term (current) drug therapy: Secondary | ICD-10-CM | POA: Diagnosis not present

## 2017-08-28 DIAGNOSIS — E119 Type 2 diabetes mellitus without complications: Secondary | ICD-10-CM | POA: Diagnosis not present

## 2017-08-28 DIAGNOSIS — Z794 Long term (current) use of insulin: Secondary | ICD-10-CM | POA: Diagnosis not present

## 2017-08-28 DIAGNOSIS — E785 Hyperlipidemia, unspecified: Secondary | ICD-10-CM | POA: Diagnosis not present

## 2017-08-28 DIAGNOSIS — D631 Anemia in chronic kidney disease: Secondary | ICD-10-CM | POA: Diagnosis not present

## 2017-08-28 DIAGNOSIS — D509 Iron deficiency anemia, unspecified: Secondary | ICD-10-CM | POA: Diagnosis not present

## 2017-08-30 DIAGNOSIS — D631 Anemia in chronic kidney disease: Secondary | ICD-10-CM | POA: Diagnosis not present

## 2017-08-30 DIAGNOSIS — N2581 Secondary hyperparathyroidism of renal origin: Secondary | ICD-10-CM | POA: Diagnosis not present

## 2017-08-30 DIAGNOSIS — D509 Iron deficiency anemia, unspecified: Secondary | ICD-10-CM | POA: Diagnosis not present

## 2017-08-30 DIAGNOSIS — Z992 Dependence on renal dialysis: Secondary | ICD-10-CM | POA: Diagnosis not present

## 2017-08-30 DIAGNOSIS — N186 End stage renal disease: Secondary | ICD-10-CM | POA: Diagnosis not present

## 2017-09-01 DIAGNOSIS — N186 End stage renal disease: Secondary | ICD-10-CM | POA: Diagnosis not present

## 2017-09-01 DIAGNOSIS — N2581 Secondary hyperparathyroidism of renal origin: Secondary | ICD-10-CM | POA: Diagnosis not present

## 2017-09-01 DIAGNOSIS — Z992 Dependence on renal dialysis: Secondary | ICD-10-CM | POA: Diagnosis not present

## 2017-09-01 DIAGNOSIS — D509 Iron deficiency anemia, unspecified: Secondary | ICD-10-CM | POA: Diagnosis not present

## 2017-09-01 DIAGNOSIS — D631 Anemia in chronic kidney disease: Secondary | ICD-10-CM | POA: Diagnosis not present

## 2017-09-02 DIAGNOSIS — N186 End stage renal disease: Secondary | ICD-10-CM | POA: Diagnosis not present

## 2017-09-02 DIAGNOSIS — Z992 Dependence on renal dialysis: Secondary | ICD-10-CM | POA: Diagnosis not present

## 2017-09-04 DIAGNOSIS — Z992 Dependence on renal dialysis: Secondary | ICD-10-CM | POA: Diagnosis not present

## 2017-09-04 DIAGNOSIS — N2581 Secondary hyperparathyroidism of renal origin: Secondary | ICD-10-CM | POA: Diagnosis not present

## 2017-09-04 DIAGNOSIS — D631 Anemia in chronic kidney disease: Secondary | ICD-10-CM | POA: Diagnosis not present

## 2017-09-04 DIAGNOSIS — D509 Iron deficiency anemia, unspecified: Secondary | ICD-10-CM | POA: Diagnosis not present

## 2017-09-04 DIAGNOSIS — N186 End stage renal disease: Secondary | ICD-10-CM | POA: Diagnosis not present

## 2017-09-06 DIAGNOSIS — N186 End stage renal disease: Secondary | ICD-10-CM | POA: Diagnosis not present

## 2017-09-06 DIAGNOSIS — N2581 Secondary hyperparathyroidism of renal origin: Secondary | ICD-10-CM | POA: Diagnosis not present

## 2017-09-06 DIAGNOSIS — D509 Iron deficiency anemia, unspecified: Secondary | ICD-10-CM | POA: Diagnosis not present

## 2017-09-06 DIAGNOSIS — D631 Anemia in chronic kidney disease: Secondary | ICD-10-CM | POA: Diagnosis not present

## 2017-09-06 DIAGNOSIS — Z992 Dependence on renal dialysis: Secondary | ICD-10-CM | POA: Diagnosis not present

## 2017-09-08 DIAGNOSIS — D509 Iron deficiency anemia, unspecified: Secondary | ICD-10-CM | POA: Diagnosis not present

## 2017-09-08 DIAGNOSIS — D631 Anemia in chronic kidney disease: Secondary | ICD-10-CM | POA: Diagnosis not present

## 2017-09-08 DIAGNOSIS — Z992 Dependence on renal dialysis: Secondary | ICD-10-CM | POA: Diagnosis not present

## 2017-09-08 DIAGNOSIS — N186 End stage renal disease: Secondary | ICD-10-CM | POA: Diagnosis not present

## 2017-09-08 DIAGNOSIS — N2581 Secondary hyperparathyroidism of renal origin: Secondary | ICD-10-CM | POA: Diagnosis not present

## 2017-09-11 DIAGNOSIS — N2581 Secondary hyperparathyroidism of renal origin: Secondary | ICD-10-CM | POA: Diagnosis not present

## 2017-09-11 DIAGNOSIS — D509 Iron deficiency anemia, unspecified: Secondary | ICD-10-CM | POA: Diagnosis not present

## 2017-09-11 DIAGNOSIS — Z992 Dependence on renal dialysis: Secondary | ICD-10-CM | POA: Diagnosis not present

## 2017-09-11 DIAGNOSIS — D631 Anemia in chronic kidney disease: Secondary | ICD-10-CM | POA: Diagnosis not present

## 2017-09-11 DIAGNOSIS — N186 End stage renal disease: Secondary | ICD-10-CM | POA: Diagnosis not present

## 2017-09-13 DIAGNOSIS — D509 Iron deficiency anemia, unspecified: Secondary | ICD-10-CM | POA: Diagnosis not present

## 2017-09-13 DIAGNOSIS — D631 Anemia in chronic kidney disease: Secondary | ICD-10-CM | POA: Diagnosis not present

## 2017-09-13 DIAGNOSIS — N186 End stage renal disease: Secondary | ICD-10-CM | POA: Diagnosis not present

## 2017-09-13 DIAGNOSIS — N2581 Secondary hyperparathyroidism of renal origin: Secondary | ICD-10-CM | POA: Diagnosis not present

## 2017-09-13 DIAGNOSIS — Z992 Dependence on renal dialysis: Secondary | ICD-10-CM | POA: Diagnosis not present

## 2017-09-18 DIAGNOSIS — N2581 Secondary hyperparathyroidism of renal origin: Secondary | ICD-10-CM | POA: Diagnosis not present

## 2017-09-18 DIAGNOSIS — N186 End stage renal disease: Secondary | ICD-10-CM | POA: Diagnosis not present

## 2017-09-18 DIAGNOSIS — Z992 Dependence on renal dialysis: Secondary | ICD-10-CM | POA: Diagnosis not present

## 2017-09-18 DIAGNOSIS — D631 Anemia in chronic kidney disease: Secondary | ICD-10-CM | POA: Diagnosis not present

## 2017-09-18 DIAGNOSIS — D509 Iron deficiency anemia, unspecified: Secondary | ICD-10-CM | POA: Diagnosis not present

## 2017-09-20 DIAGNOSIS — D631 Anemia in chronic kidney disease: Secondary | ICD-10-CM | POA: Diagnosis not present

## 2017-09-20 DIAGNOSIS — D509 Iron deficiency anemia, unspecified: Secondary | ICD-10-CM | POA: Diagnosis not present

## 2017-09-20 DIAGNOSIS — N186 End stage renal disease: Secondary | ICD-10-CM | POA: Diagnosis not present

## 2017-09-20 DIAGNOSIS — Z992 Dependence on renal dialysis: Secondary | ICD-10-CM | POA: Diagnosis not present

## 2017-09-20 DIAGNOSIS — N2581 Secondary hyperparathyroidism of renal origin: Secondary | ICD-10-CM | POA: Diagnosis not present

## 2017-09-22 DIAGNOSIS — D631 Anemia in chronic kidney disease: Secondary | ICD-10-CM | POA: Diagnosis not present

## 2017-09-22 DIAGNOSIS — Z992 Dependence on renal dialysis: Secondary | ICD-10-CM | POA: Diagnosis not present

## 2017-09-22 DIAGNOSIS — N2581 Secondary hyperparathyroidism of renal origin: Secondary | ICD-10-CM | POA: Diagnosis not present

## 2017-09-22 DIAGNOSIS — N186 End stage renal disease: Secondary | ICD-10-CM | POA: Diagnosis not present

## 2017-09-22 DIAGNOSIS — D509 Iron deficiency anemia, unspecified: Secondary | ICD-10-CM | POA: Diagnosis not present

## 2017-09-25 DIAGNOSIS — Z992 Dependence on renal dialysis: Secondary | ICD-10-CM | POA: Diagnosis not present

## 2017-09-25 DIAGNOSIS — N186 End stage renal disease: Secondary | ICD-10-CM | POA: Diagnosis not present

## 2017-09-25 DIAGNOSIS — D631 Anemia in chronic kidney disease: Secondary | ICD-10-CM | POA: Diagnosis not present

## 2017-09-25 DIAGNOSIS — N2581 Secondary hyperparathyroidism of renal origin: Secondary | ICD-10-CM | POA: Diagnosis not present

## 2017-09-25 DIAGNOSIS — D509 Iron deficiency anemia, unspecified: Secondary | ICD-10-CM | POA: Diagnosis not present

## 2017-09-26 DIAGNOSIS — E113393 Type 2 diabetes mellitus with moderate nonproliferative diabetic retinopathy without macular edema, bilateral: Secondary | ICD-10-CM | POA: Diagnosis not present

## 2017-09-26 DIAGNOSIS — Z7984 Long term (current) use of oral hypoglycemic drugs: Secondary | ICD-10-CM | POA: Diagnosis not present

## 2017-09-26 DIAGNOSIS — H25813 Combined forms of age-related cataract, bilateral: Secondary | ICD-10-CM | POA: Diagnosis not present

## 2017-09-26 DIAGNOSIS — E119 Type 2 diabetes mellitus without complications: Secondary | ICD-10-CM | POA: Diagnosis not present

## 2017-09-27 DIAGNOSIS — N186 End stage renal disease: Secondary | ICD-10-CM | POA: Diagnosis not present

## 2017-09-27 DIAGNOSIS — Z992 Dependence on renal dialysis: Secondary | ICD-10-CM | POA: Diagnosis not present

## 2017-09-27 DIAGNOSIS — D631 Anemia in chronic kidney disease: Secondary | ICD-10-CM | POA: Diagnosis not present

## 2017-09-27 DIAGNOSIS — D509 Iron deficiency anemia, unspecified: Secondary | ICD-10-CM | POA: Diagnosis not present

## 2017-09-27 DIAGNOSIS — N2581 Secondary hyperparathyroidism of renal origin: Secondary | ICD-10-CM | POA: Diagnosis not present

## 2017-09-29 DIAGNOSIS — N2581 Secondary hyperparathyroidism of renal origin: Secondary | ICD-10-CM | POA: Diagnosis not present

## 2017-09-29 DIAGNOSIS — D631 Anemia in chronic kidney disease: Secondary | ICD-10-CM | POA: Diagnosis not present

## 2017-09-29 DIAGNOSIS — D509 Iron deficiency anemia, unspecified: Secondary | ICD-10-CM | POA: Diagnosis not present

## 2017-09-29 DIAGNOSIS — N186 End stage renal disease: Secondary | ICD-10-CM | POA: Diagnosis not present

## 2017-09-29 DIAGNOSIS — Z992 Dependence on renal dialysis: Secondary | ICD-10-CM | POA: Diagnosis not present

## 2017-10-02 DIAGNOSIS — N2581 Secondary hyperparathyroidism of renal origin: Secondary | ICD-10-CM | POA: Diagnosis not present

## 2017-10-02 DIAGNOSIS — D631 Anemia in chronic kidney disease: Secondary | ICD-10-CM | POA: Diagnosis not present

## 2017-10-02 DIAGNOSIS — N186 End stage renal disease: Secondary | ICD-10-CM | POA: Diagnosis not present

## 2017-10-02 DIAGNOSIS — Z992 Dependence on renal dialysis: Secondary | ICD-10-CM | POA: Diagnosis not present

## 2017-10-02 DIAGNOSIS — D509 Iron deficiency anemia, unspecified: Secondary | ICD-10-CM | POA: Diagnosis not present

## 2017-10-04 DIAGNOSIS — N2581 Secondary hyperparathyroidism of renal origin: Secondary | ICD-10-CM | POA: Diagnosis not present

## 2017-10-04 DIAGNOSIS — D631 Anemia in chronic kidney disease: Secondary | ICD-10-CM | POA: Diagnosis not present

## 2017-10-04 DIAGNOSIS — Z992 Dependence on renal dialysis: Secondary | ICD-10-CM | POA: Diagnosis not present

## 2017-10-04 DIAGNOSIS — N186 End stage renal disease: Secondary | ICD-10-CM | POA: Diagnosis not present

## 2017-10-08 DIAGNOSIS — N186 End stage renal disease: Secondary | ICD-10-CM | POA: Diagnosis not present

## 2017-10-08 DIAGNOSIS — Z992 Dependence on renal dialysis: Secondary | ICD-10-CM | POA: Diagnosis not present

## 2017-10-08 DIAGNOSIS — D631 Anemia in chronic kidney disease: Secondary | ICD-10-CM | POA: Diagnosis not present

## 2017-10-08 DIAGNOSIS — N2581 Secondary hyperparathyroidism of renal origin: Secondary | ICD-10-CM | POA: Diagnosis not present

## 2017-10-09 DIAGNOSIS — Z992 Dependence on renal dialysis: Secondary | ICD-10-CM | POA: Diagnosis not present

## 2017-10-09 DIAGNOSIS — N2581 Secondary hyperparathyroidism of renal origin: Secondary | ICD-10-CM | POA: Diagnosis not present

## 2017-10-09 DIAGNOSIS — N186 End stage renal disease: Secondary | ICD-10-CM | POA: Diagnosis not present

## 2017-10-09 DIAGNOSIS — D631 Anemia in chronic kidney disease: Secondary | ICD-10-CM | POA: Diagnosis not present

## 2017-10-13 DIAGNOSIS — N2581 Secondary hyperparathyroidism of renal origin: Secondary | ICD-10-CM | POA: Diagnosis not present

## 2017-10-13 DIAGNOSIS — Z992 Dependence on renal dialysis: Secondary | ICD-10-CM | POA: Diagnosis not present

## 2017-10-13 DIAGNOSIS — D631 Anemia in chronic kidney disease: Secondary | ICD-10-CM | POA: Diagnosis not present

## 2017-10-13 DIAGNOSIS — N186 End stage renal disease: Secondary | ICD-10-CM | POA: Diagnosis not present

## 2017-10-16 DIAGNOSIS — D631 Anemia in chronic kidney disease: Secondary | ICD-10-CM | POA: Diagnosis not present

## 2017-10-16 DIAGNOSIS — Z992 Dependence on renal dialysis: Secondary | ICD-10-CM | POA: Diagnosis not present

## 2017-10-16 DIAGNOSIS — N186 End stage renal disease: Secondary | ICD-10-CM | POA: Diagnosis not present

## 2017-10-16 DIAGNOSIS — N2581 Secondary hyperparathyroidism of renal origin: Secondary | ICD-10-CM | POA: Diagnosis not present

## 2017-10-18 DIAGNOSIS — N186 End stage renal disease: Secondary | ICD-10-CM | POA: Diagnosis not present

## 2017-10-18 DIAGNOSIS — Z992 Dependence on renal dialysis: Secondary | ICD-10-CM | POA: Diagnosis not present

## 2017-10-18 DIAGNOSIS — N2581 Secondary hyperparathyroidism of renal origin: Secondary | ICD-10-CM | POA: Diagnosis not present

## 2017-10-18 DIAGNOSIS — D631 Anemia in chronic kidney disease: Secondary | ICD-10-CM | POA: Diagnosis not present

## 2017-10-20 DIAGNOSIS — D631 Anemia in chronic kidney disease: Secondary | ICD-10-CM | POA: Diagnosis not present

## 2017-10-20 DIAGNOSIS — Z992 Dependence on renal dialysis: Secondary | ICD-10-CM | POA: Diagnosis not present

## 2017-10-20 DIAGNOSIS — N186 End stage renal disease: Secondary | ICD-10-CM | POA: Diagnosis not present

## 2017-10-20 DIAGNOSIS — N2581 Secondary hyperparathyroidism of renal origin: Secondary | ICD-10-CM | POA: Diagnosis not present

## 2017-10-23 DIAGNOSIS — Z992 Dependence on renal dialysis: Secondary | ICD-10-CM | POA: Diagnosis not present

## 2017-10-23 DIAGNOSIS — D631 Anemia in chronic kidney disease: Secondary | ICD-10-CM | POA: Diagnosis not present

## 2017-10-23 DIAGNOSIS — N2581 Secondary hyperparathyroidism of renal origin: Secondary | ICD-10-CM | POA: Diagnosis not present

## 2017-10-23 DIAGNOSIS — N186 End stage renal disease: Secondary | ICD-10-CM | POA: Diagnosis not present

## 2017-10-25 DIAGNOSIS — Z992 Dependence on renal dialysis: Secondary | ICD-10-CM | POA: Diagnosis not present

## 2017-10-25 DIAGNOSIS — N186 End stage renal disease: Secondary | ICD-10-CM | POA: Diagnosis not present

## 2017-10-25 DIAGNOSIS — N2581 Secondary hyperparathyroidism of renal origin: Secondary | ICD-10-CM | POA: Diagnosis not present

## 2017-10-25 DIAGNOSIS — D631 Anemia in chronic kidney disease: Secondary | ICD-10-CM | POA: Diagnosis not present

## 2017-10-27 DIAGNOSIS — D631 Anemia in chronic kidney disease: Secondary | ICD-10-CM | POA: Diagnosis not present

## 2017-10-27 DIAGNOSIS — Z992 Dependence on renal dialysis: Secondary | ICD-10-CM | POA: Diagnosis not present

## 2017-10-27 DIAGNOSIS — N2581 Secondary hyperparathyroidism of renal origin: Secondary | ICD-10-CM | POA: Diagnosis not present

## 2017-10-27 DIAGNOSIS — N186 End stage renal disease: Secondary | ICD-10-CM | POA: Diagnosis not present

## 2017-10-30 DIAGNOSIS — W01198A Fall on same level from slipping, tripping and stumbling with subsequent striking against other object, initial encounter: Secondary | ICD-10-CM | POA: Diagnosis not present

## 2017-10-30 DIAGNOSIS — S79912A Unspecified injury of left hip, initial encounter: Secondary | ICD-10-CM | POA: Diagnosis not present

## 2017-10-30 DIAGNOSIS — S7002XA Contusion of left hip, initial encounter: Secondary | ICD-10-CM | POA: Diagnosis not present

## 2017-10-30 DIAGNOSIS — M25552 Pain in left hip: Secondary | ICD-10-CM | POA: Diagnosis not present

## 2017-10-30 DIAGNOSIS — S51012A Laceration without foreign body of left elbow, initial encounter: Secondary | ICD-10-CM | POA: Diagnosis not present

## 2017-11-01 DIAGNOSIS — Z992 Dependence on renal dialysis: Secondary | ICD-10-CM | POA: Diagnosis not present

## 2017-11-01 DIAGNOSIS — D631 Anemia in chronic kidney disease: Secondary | ICD-10-CM | POA: Diagnosis not present

## 2017-11-01 DIAGNOSIS — N2581 Secondary hyperparathyroidism of renal origin: Secondary | ICD-10-CM | POA: Diagnosis not present

## 2017-11-01 DIAGNOSIS — N186 End stage renal disease: Secondary | ICD-10-CM | POA: Diagnosis not present

## 2017-11-02 DIAGNOSIS — N186 End stage renal disease: Secondary | ICD-10-CM | POA: Diagnosis not present

## 2017-11-02 DIAGNOSIS — Z992 Dependence on renal dialysis: Secondary | ICD-10-CM | POA: Diagnosis not present

## 2017-11-03 DIAGNOSIS — D631 Anemia in chronic kidney disease: Secondary | ICD-10-CM | POA: Diagnosis not present

## 2017-11-03 DIAGNOSIS — N2581 Secondary hyperparathyroidism of renal origin: Secondary | ICD-10-CM | POA: Diagnosis not present

## 2017-11-03 DIAGNOSIS — D509 Iron deficiency anemia, unspecified: Secondary | ICD-10-CM | POA: Diagnosis not present

## 2017-11-03 DIAGNOSIS — N186 End stage renal disease: Secondary | ICD-10-CM | POA: Diagnosis not present

## 2017-11-03 DIAGNOSIS — Z992 Dependence on renal dialysis: Secondary | ICD-10-CM | POA: Diagnosis not present

## 2017-11-06 DIAGNOSIS — N2581 Secondary hyperparathyroidism of renal origin: Secondary | ICD-10-CM | POA: Diagnosis not present

## 2017-11-06 DIAGNOSIS — D631 Anemia in chronic kidney disease: Secondary | ICD-10-CM | POA: Diagnosis not present

## 2017-11-06 DIAGNOSIS — Z992 Dependence on renal dialysis: Secondary | ICD-10-CM | POA: Diagnosis not present

## 2017-11-06 DIAGNOSIS — N186 End stage renal disease: Secondary | ICD-10-CM | POA: Diagnosis not present

## 2017-11-06 DIAGNOSIS — D509 Iron deficiency anemia, unspecified: Secondary | ICD-10-CM | POA: Diagnosis not present

## 2017-11-08 DIAGNOSIS — Z992 Dependence on renal dialysis: Secondary | ICD-10-CM | POA: Diagnosis not present

## 2017-11-08 DIAGNOSIS — D509 Iron deficiency anemia, unspecified: Secondary | ICD-10-CM | POA: Diagnosis not present

## 2017-11-08 DIAGNOSIS — N186 End stage renal disease: Secondary | ICD-10-CM | POA: Diagnosis not present

## 2017-11-08 DIAGNOSIS — N2581 Secondary hyperparathyroidism of renal origin: Secondary | ICD-10-CM | POA: Diagnosis not present

## 2017-11-08 DIAGNOSIS — D631 Anemia in chronic kidney disease: Secondary | ICD-10-CM | POA: Diagnosis not present

## 2017-11-10 DIAGNOSIS — D631 Anemia in chronic kidney disease: Secondary | ICD-10-CM | POA: Diagnosis not present

## 2017-11-10 DIAGNOSIS — N186 End stage renal disease: Secondary | ICD-10-CM | POA: Diagnosis not present

## 2017-11-10 DIAGNOSIS — D509 Iron deficiency anemia, unspecified: Secondary | ICD-10-CM | POA: Diagnosis not present

## 2017-11-10 DIAGNOSIS — Z992 Dependence on renal dialysis: Secondary | ICD-10-CM | POA: Diagnosis not present

## 2017-11-10 DIAGNOSIS — N2581 Secondary hyperparathyroidism of renal origin: Secondary | ICD-10-CM | POA: Diagnosis not present

## 2017-11-13 DIAGNOSIS — N2581 Secondary hyperparathyroidism of renal origin: Secondary | ICD-10-CM | POA: Diagnosis not present

## 2017-11-13 DIAGNOSIS — Z992 Dependence on renal dialysis: Secondary | ICD-10-CM | POA: Diagnosis not present

## 2017-11-13 DIAGNOSIS — D631 Anemia in chronic kidney disease: Secondary | ICD-10-CM | POA: Diagnosis not present

## 2017-11-13 DIAGNOSIS — D509 Iron deficiency anemia, unspecified: Secondary | ICD-10-CM | POA: Diagnosis not present

## 2017-11-13 DIAGNOSIS — N186 End stage renal disease: Secondary | ICD-10-CM | POA: Diagnosis not present

## 2017-11-15 DIAGNOSIS — N186 End stage renal disease: Secondary | ICD-10-CM | POA: Diagnosis not present

## 2017-11-15 DIAGNOSIS — D631 Anemia in chronic kidney disease: Secondary | ICD-10-CM | POA: Diagnosis not present

## 2017-11-15 DIAGNOSIS — Z992 Dependence on renal dialysis: Secondary | ICD-10-CM | POA: Diagnosis not present

## 2017-11-15 DIAGNOSIS — D509 Iron deficiency anemia, unspecified: Secondary | ICD-10-CM | POA: Diagnosis not present

## 2017-11-15 DIAGNOSIS — N2581 Secondary hyperparathyroidism of renal origin: Secondary | ICD-10-CM | POA: Diagnosis not present

## 2017-11-17 DIAGNOSIS — D631 Anemia in chronic kidney disease: Secondary | ICD-10-CM | POA: Diagnosis not present

## 2017-11-17 DIAGNOSIS — D509 Iron deficiency anemia, unspecified: Secondary | ICD-10-CM | POA: Diagnosis not present

## 2017-11-17 DIAGNOSIS — N2581 Secondary hyperparathyroidism of renal origin: Secondary | ICD-10-CM | POA: Diagnosis not present

## 2017-11-17 DIAGNOSIS — Z992 Dependence on renal dialysis: Secondary | ICD-10-CM | POA: Diagnosis not present

## 2017-11-17 DIAGNOSIS — N186 End stage renal disease: Secondary | ICD-10-CM | POA: Diagnosis not present

## 2017-11-20 DIAGNOSIS — N2581 Secondary hyperparathyroidism of renal origin: Secondary | ICD-10-CM | POA: Diagnosis not present

## 2017-11-20 DIAGNOSIS — D509 Iron deficiency anemia, unspecified: Secondary | ICD-10-CM | POA: Diagnosis not present

## 2017-11-20 DIAGNOSIS — Z992 Dependence on renal dialysis: Secondary | ICD-10-CM | POA: Diagnosis not present

## 2017-11-20 DIAGNOSIS — D631 Anemia in chronic kidney disease: Secondary | ICD-10-CM | POA: Diagnosis not present

## 2017-11-20 DIAGNOSIS — N186 End stage renal disease: Secondary | ICD-10-CM | POA: Diagnosis not present

## 2017-11-24 DIAGNOSIS — N186 End stage renal disease: Secondary | ICD-10-CM | POA: Diagnosis not present

## 2017-11-24 DIAGNOSIS — D509 Iron deficiency anemia, unspecified: Secondary | ICD-10-CM | POA: Diagnosis not present

## 2017-11-24 DIAGNOSIS — Z992 Dependence on renal dialysis: Secondary | ICD-10-CM | POA: Diagnosis not present

## 2017-11-24 DIAGNOSIS — N2581 Secondary hyperparathyroidism of renal origin: Secondary | ICD-10-CM | POA: Diagnosis not present

## 2017-11-24 DIAGNOSIS — D631 Anemia in chronic kidney disease: Secondary | ICD-10-CM | POA: Diagnosis not present

## 2017-11-27 DIAGNOSIS — N2581 Secondary hyperparathyroidism of renal origin: Secondary | ICD-10-CM | POA: Diagnosis not present

## 2017-11-27 DIAGNOSIS — Z794 Long term (current) use of insulin: Secondary | ICD-10-CM | POA: Diagnosis not present

## 2017-11-27 DIAGNOSIS — E119 Type 2 diabetes mellitus without complications: Secondary | ICD-10-CM | POA: Diagnosis not present

## 2017-11-27 DIAGNOSIS — N186 End stage renal disease: Secondary | ICD-10-CM | POA: Diagnosis not present

## 2017-11-27 DIAGNOSIS — Z992 Dependence on renal dialysis: Secondary | ICD-10-CM | POA: Diagnosis not present

## 2017-11-27 DIAGNOSIS — D631 Anemia in chronic kidney disease: Secondary | ICD-10-CM | POA: Diagnosis not present

## 2017-11-27 DIAGNOSIS — D509 Iron deficiency anemia, unspecified: Secondary | ICD-10-CM | POA: Diagnosis not present

## 2017-11-29 DIAGNOSIS — Z992 Dependence on renal dialysis: Secondary | ICD-10-CM | POA: Diagnosis not present

## 2017-11-29 DIAGNOSIS — D509 Iron deficiency anemia, unspecified: Secondary | ICD-10-CM | POA: Diagnosis not present

## 2017-11-29 DIAGNOSIS — N2581 Secondary hyperparathyroidism of renal origin: Secondary | ICD-10-CM | POA: Diagnosis not present

## 2017-11-29 DIAGNOSIS — N186 End stage renal disease: Secondary | ICD-10-CM | POA: Diagnosis not present

## 2017-11-29 DIAGNOSIS — D631 Anemia in chronic kidney disease: Secondary | ICD-10-CM | POA: Diagnosis not present

## 2017-12-01 DIAGNOSIS — D509 Iron deficiency anemia, unspecified: Secondary | ICD-10-CM | POA: Diagnosis not present

## 2017-12-01 DIAGNOSIS — N2581 Secondary hyperparathyroidism of renal origin: Secondary | ICD-10-CM | POA: Diagnosis not present

## 2017-12-01 DIAGNOSIS — N186 End stage renal disease: Secondary | ICD-10-CM | POA: Diagnosis not present

## 2017-12-01 DIAGNOSIS — D631 Anemia in chronic kidney disease: Secondary | ICD-10-CM | POA: Diagnosis not present

## 2017-12-01 DIAGNOSIS — Z992 Dependence on renal dialysis: Secondary | ICD-10-CM | POA: Diagnosis not present

## 2017-12-02 DIAGNOSIS — N186 End stage renal disease: Secondary | ICD-10-CM | POA: Diagnosis not present

## 2017-12-02 DIAGNOSIS — Z992 Dependence on renal dialysis: Secondary | ICD-10-CM | POA: Diagnosis not present

## 2017-12-03 DIAGNOSIS — D509 Iron deficiency anemia, unspecified: Secondary | ICD-10-CM | POA: Diagnosis not present

## 2017-12-03 DIAGNOSIS — D631 Anemia in chronic kidney disease: Secondary | ICD-10-CM | POA: Diagnosis not present

## 2017-12-03 DIAGNOSIS — N186 End stage renal disease: Secondary | ICD-10-CM | POA: Diagnosis not present

## 2017-12-03 DIAGNOSIS — N2581 Secondary hyperparathyroidism of renal origin: Secondary | ICD-10-CM | POA: Diagnosis not present

## 2017-12-03 DIAGNOSIS — Z992 Dependence on renal dialysis: Secondary | ICD-10-CM | POA: Diagnosis not present

## 2017-12-05 DIAGNOSIS — D509 Iron deficiency anemia, unspecified: Secondary | ICD-10-CM | POA: Diagnosis not present

## 2017-12-05 DIAGNOSIS — D631 Anemia in chronic kidney disease: Secondary | ICD-10-CM | POA: Diagnosis not present

## 2017-12-05 DIAGNOSIS — Z992 Dependence on renal dialysis: Secondary | ICD-10-CM | POA: Diagnosis not present

## 2017-12-05 DIAGNOSIS — N186 End stage renal disease: Secondary | ICD-10-CM | POA: Diagnosis not present

## 2017-12-05 DIAGNOSIS — N2581 Secondary hyperparathyroidism of renal origin: Secondary | ICD-10-CM | POA: Diagnosis not present

## 2017-12-07 DIAGNOSIS — N186 End stage renal disease: Secondary | ICD-10-CM | POA: Diagnosis not present

## 2017-12-07 DIAGNOSIS — D509 Iron deficiency anemia, unspecified: Secondary | ICD-10-CM | POA: Diagnosis not present

## 2017-12-07 DIAGNOSIS — D631 Anemia in chronic kidney disease: Secondary | ICD-10-CM | POA: Diagnosis not present

## 2017-12-07 DIAGNOSIS — Z992 Dependence on renal dialysis: Secondary | ICD-10-CM | POA: Diagnosis not present

## 2017-12-07 DIAGNOSIS — N2581 Secondary hyperparathyroidism of renal origin: Secondary | ICD-10-CM | POA: Diagnosis not present

## 2017-12-10 DIAGNOSIS — N186 End stage renal disease: Secondary | ICD-10-CM | POA: Diagnosis not present

## 2017-12-10 DIAGNOSIS — D631 Anemia in chronic kidney disease: Secondary | ICD-10-CM | POA: Diagnosis not present

## 2017-12-10 DIAGNOSIS — D509 Iron deficiency anemia, unspecified: Secondary | ICD-10-CM | POA: Diagnosis not present

## 2017-12-10 DIAGNOSIS — N2581 Secondary hyperparathyroidism of renal origin: Secondary | ICD-10-CM | POA: Diagnosis not present

## 2017-12-10 DIAGNOSIS — Z992 Dependence on renal dialysis: Secondary | ICD-10-CM | POA: Diagnosis not present

## 2017-12-12 DIAGNOSIS — D509 Iron deficiency anemia, unspecified: Secondary | ICD-10-CM | POA: Diagnosis not present

## 2017-12-12 DIAGNOSIS — N2581 Secondary hyperparathyroidism of renal origin: Secondary | ICD-10-CM | POA: Diagnosis not present

## 2017-12-12 DIAGNOSIS — D631 Anemia in chronic kidney disease: Secondary | ICD-10-CM | POA: Diagnosis not present

## 2017-12-12 DIAGNOSIS — N186 End stage renal disease: Secondary | ICD-10-CM | POA: Diagnosis not present

## 2017-12-12 DIAGNOSIS — Z992 Dependence on renal dialysis: Secondary | ICD-10-CM | POA: Diagnosis not present

## 2017-12-14 DIAGNOSIS — N2581 Secondary hyperparathyroidism of renal origin: Secondary | ICD-10-CM | POA: Diagnosis not present

## 2017-12-14 DIAGNOSIS — Z992 Dependence on renal dialysis: Secondary | ICD-10-CM | POA: Diagnosis not present

## 2017-12-14 DIAGNOSIS — N186 End stage renal disease: Secondary | ICD-10-CM | POA: Diagnosis not present

## 2017-12-14 DIAGNOSIS — D631 Anemia in chronic kidney disease: Secondary | ICD-10-CM | POA: Diagnosis not present

## 2017-12-14 DIAGNOSIS — D509 Iron deficiency anemia, unspecified: Secondary | ICD-10-CM | POA: Diagnosis not present

## 2017-12-17 DIAGNOSIS — D509 Iron deficiency anemia, unspecified: Secondary | ICD-10-CM | POA: Diagnosis not present

## 2017-12-17 DIAGNOSIS — D631 Anemia in chronic kidney disease: Secondary | ICD-10-CM | POA: Diagnosis not present

## 2017-12-17 DIAGNOSIS — N2581 Secondary hyperparathyroidism of renal origin: Secondary | ICD-10-CM | POA: Diagnosis not present

## 2017-12-17 DIAGNOSIS — N186 End stage renal disease: Secondary | ICD-10-CM | POA: Diagnosis not present

## 2017-12-17 DIAGNOSIS — Z992 Dependence on renal dialysis: Secondary | ICD-10-CM | POA: Diagnosis not present

## 2017-12-21 DIAGNOSIS — D631 Anemia in chronic kidney disease: Secondary | ICD-10-CM | POA: Diagnosis not present

## 2017-12-21 DIAGNOSIS — D509 Iron deficiency anemia, unspecified: Secondary | ICD-10-CM | POA: Diagnosis not present

## 2017-12-21 DIAGNOSIS — Z992 Dependence on renal dialysis: Secondary | ICD-10-CM | POA: Diagnosis not present

## 2017-12-21 DIAGNOSIS — N2581 Secondary hyperparathyroidism of renal origin: Secondary | ICD-10-CM | POA: Diagnosis not present

## 2017-12-21 DIAGNOSIS — N186 End stage renal disease: Secondary | ICD-10-CM | POA: Diagnosis not present

## 2017-12-24 DIAGNOSIS — Z299 Encounter for prophylactic measures, unspecified: Secondary | ICD-10-CM | POA: Diagnosis not present

## 2017-12-24 DIAGNOSIS — I10 Essential (primary) hypertension: Secondary | ICD-10-CM | POA: Diagnosis not present

## 2017-12-24 DIAGNOSIS — E1122 Type 2 diabetes mellitus with diabetic chronic kidney disease: Secondary | ICD-10-CM | POA: Diagnosis not present

## 2017-12-24 DIAGNOSIS — Z789 Other specified health status: Secondary | ICD-10-CM | POA: Diagnosis not present

## 2017-12-24 DIAGNOSIS — Z6828 Body mass index (BMI) 28.0-28.9, adult: Secondary | ICD-10-CM | POA: Diagnosis not present

## 2017-12-24 DIAGNOSIS — J329 Chronic sinusitis, unspecified: Secondary | ICD-10-CM | POA: Diagnosis not present

## 2017-12-26 DIAGNOSIS — N2581 Secondary hyperparathyroidism of renal origin: Secondary | ICD-10-CM | POA: Diagnosis not present

## 2017-12-26 DIAGNOSIS — D631 Anemia in chronic kidney disease: Secondary | ICD-10-CM | POA: Diagnosis not present

## 2017-12-26 DIAGNOSIS — Z992 Dependence on renal dialysis: Secondary | ICD-10-CM | POA: Diagnosis not present

## 2017-12-26 DIAGNOSIS — N186 End stage renal disease: Secondary | ICD-10-CM | POA: Diagnosis not present

## 2017-12-26 DIAGNOSIS — D509 Iron deficiency anemia, unspecified: Secondary | ICD-10-CM | POA: Diagnosis not present

## 2017-12-28 DIAGNOSIS — N2581 Secondary hyperparathyroidism of renal origin: Secondary | ICD-10-CM | POA: Diagnosis not present

## 2017-12-28 DIAGNOSIS — D631 Anemia in chronic kidney disease: Secondary | ICD-10-CM | POA: Diagnosis not present

## 2017-12-28 DIAGNOSIS — D509 Iron deficiency anemia, unspecified: Secondary | ICD-10-CM | POA: Diagnosis not present

## 2017-12-28 DIAGNOSIS — N186 End stage renal disease: Secondary | ICD-10-CM | POA: Diagnosis not present

## 2017-12-28 DIAGNOSIS — Z992 Dependence on renal dialysis: Secondary | ICD-10-CM | POA: Diagnosis not present

## 2017-12-31 DIAGNOSIS — D509 Iron deficiency anemia, unspecified: Secondary | ICD-10-CM | POA: Diagnosis not present

## 2017-12-31 DIAGNOSIS — D631 Anemia in chronic kidney disease: Secondary | ICD-10-CM | POA: Diagnosis not present

## 2017-12-31 DIAGNOSIS — N186 End stage renal disease: Secondary | ICD-10-CM | POA: Diagnosis not present

## 2017-12-31 DIAGNOSIS — N2581 Secondary hyperparathyroidism of renal origin: Secondary | ICD-10-CM | POA: Diagnosis not present

## 2017-12-31 DIAGNOSIS — Z992 Dependence on renal dialysis: Secondary | ICD-10-CM | POA: Diagnosis not present

## 2018-01-02 DIAGNOSIS — N186 End stage renal disease: Secondary | ICD-10-CM | POA: Diagnosis not present

## 2018-01-02 DIAGNOSIS — Z992 Dependence on renal dialysis: Secondary | ICD-10-CM | POA: Diagnosis not present

## 2018-01-02 DIAGNOSIS — D631 Anemia in chronic kidney disease: Secondary | ICD-10-CM | POA: Diagnosis not present

## 2018-01-02 DIAGNOSIS — D509 Iron deficiency anemia, unspecified: Secondary | ICD-10-CM | POA: Diagnosis not present

## 2018-01-02 DIAGNOSIS — Z1159 Encounter for screening for other viral diseases: Secondary | ICD-10-CM | POA: Diagnosis not present

## 2018-01-02 DIAGNOSIS — N2581 Secondary hyperparathyroidism of renal origin: Secondary | ICD-10-CM | POA: Diagnosis not present

## 2018-01-04 DIAGNOSIS — N2581 Secondary hyperparathyroidism of renal origin: Secondary | ICD-10-CM | POA: Diagnosis not present

## 2018-01-04 DIAGNOSIS — N186 End stage renal disease: Secondary | ICD-10-CM | POA: Diagnosis not present

## 2018-01-04 DIAGNOSIS — Z992 Dependence on renal dialysis: Secondary | ICD-10-CM | POA: Diagnosis not present

## 2018-01-04 DIAGNOSIS — D509 Iron deficiency anemia, unspecified: Secondary | ICD-10-CM | POA: Diagnosis not present

## 2018-01-04 DIAGNOSIS — D631 Anemia in chronic kidney disease: Secondary | ICD-10-CM | POA: Diagnosis not present

## 2018-01-07 DIAGNOSIS — N2581 Secondary hyperparathyroidism of renal origin: Secondary | ICD-10-CM | POA: Diagnosis not present

## 2018-01-07 DIAGNOSIS — D631 Anemia in chronic kidney disease: Secondary | ICD-10-CM | POA: Diagnosis not present

## 2018-01-07 DIAGNOSIS — N186 End stage renal disease: Secondary | ICD-10-CM | POA: Diagnosis not present

## 2018-01-07 DIAGNOSIS — D509 Iron deficiency anemia, unspecified: Secondary | ICD-10-CM | POA: Diagnosis not present

## 2018-01-07 DIAGNOSIS — Z992 Dependence on renal dialysis: Secondary | ICD-10-CM | POA: Diagnosis not present

## 2018-01-09 DIAGNOSIS — N186 End stage renal disease: Secondary | ICD-10-CM | POA: Diagnosis not present

## 2018-01-09 DIAGNOSIS — Z992 Dependence on renal dialysis: Secondary | ICD-10-CM | POA: Diagnosis not present

## 2018-01-09 DIAGNOSIS — N2581 Secondary hyperparathyroidism of renal origin: Secondary | ICD-10-CM | POA: Diagnosis not present

## 2018-01-09 DIAGNOSIS — D509 Iron deficiency anemia, unspecified: Secondary | ICD-10-CM | POA: Diagnosis not present

## 2018-01-09 DIAGNOSIS — D631 Anemia in chronic kidney disease: Secondary | ICD-10-CM | POA: Diagnosis not present

## 2018-01-11 DIAGNOSIS — N186 End stage renal disease: Secondary | ICD-10-CM | POA: Diagnosis not present

## 2018-01-11 DIAGNOSIS — N2581 Secondary hyperparathyroidism of renal origin: Secondary | ICD-10-CM | POA: Diagnosis not present

## 2018-01-11 DIAGNOSIS — D631 Anemia in chronic kidney disease: Secondary | ICD-10-CM | POA: Diagnosis not present

## 2018-01-11 DIAGNOSIS — D509 Iron deficiency anemia, unspecified: Secondary | ICD-10-CM | POA: Diagnosis not present

## 2018-01-11 DIAGNOSIS — Z992 Dependence on renal dialysis: Secondary | ICD-10-CM | POA: Diagnosis not present

## 2018-01-14 DIAGNOSIS — D631 Anemia in chronic kidney disease: Secondary | ICD-10-CM | POA: Diagnosis not present

## 2018-01-14 DIAGNOSIS — Z992 Dependence on renal dialysis: Secondary | ICD-10-CM | POA: Diagnosis not present

## 2018-01-14 DIAGNOSIS — D509 Iron deficiency anemia, unspecified: Secondary | ICD-10-CM | POA: Diagnosis not present

## 2018-01-14 DIAGNOSIS — N186 End stage renal disease: Secondary | ICD-10-CM | POA: Diagnosis not present

## 2018-01-14 DIAGNOSIS — N2581 Secondary hyperparathyroidism of renal origin: Secondary | ICD-10-CM | POA: Diagnosis not present

## 2018-01-16 DIAGNOSIS — D631 Anemia in chronic kidney disease: Secondary | ICD-10-CM | POA: Diagnosis not present

## 2018-01-16 DIAGNOSIS — D509 Iron deficiency anemia, unspecified: Secondary | ICD-10-CM | POA: Diagnosis not present

## 2018-01-16 DIAGNOSIS — Z992 Dependence on renal dialysis: Secondary | ICD-10-CM | POA: Diagnosis not present

## 2018-01-16 DIAGNOSIS — N2581 Secondary hyperparathyroidism of renal origin: Secondary | ICD-10-CM | POA: Diagnosis not present

## 2018-01-16 DIAGNOSIS — N186 End stage renal disease: Secondary | ICD-10-CM | POA: Diagnosis not present

## 2018-01-18 DIAGNOSIS — D631 Anemia in chronic kidney disease: Secondary | ICD-10-CM | POA: Diagnosis not present

## 2018-01-18 DIAGNOSIS — N186 End stage renal disease: Secondary | ICD-10-CM | POA: Diagnosis not present

## 2018-01-18 DIAGNOSIS — D509 Iron deficiency anemia, unspecified: Secondary | ICD-10-CM | POA: Diagnosis not present

## 2018-01-18 DIAGNOSIS — N2581 Secondary hyperparathyroidism of renal origin: Secondary | ICD-10-CM | POA: Diagnosis not present

## 2018-01-18 DIAGNOSIS — Z992 Dependence on renal dialysis: Secondary | ICD-10-CM | POA: Diagnosis not present

## 2018-01-21 DIAGNOSIS — N186 End stage renal disease: Secondary | ICD-10-CM | POA: Diagnosis not present

## 2018-01-21 DIAGNOSIS — N2581 Secondary hyperparathyroidism of renal origin: Secondary | ICD-10-CM | POA: Diagnosis not present

## 2018-01-21 DIAGNOSIS — Z992 Dependence on renal dialysis: Secondary | ICD-10-CM | POA: Diagnosis not present

## 2018-01-21 DIAGNOSIS — D509 Iron deficiency anemia, unspecified: Secondary | ICD-10-CM | POA: Diagnosis not present

## 2018-01-21 DIAGNOSIS — D631 Anemia in chronic kidney disease: Secondary | ICD-10-CM | POA: Diagnosis not present

## 2018-01-23 DIAGNOSIS — N186 End stage renal disease: Secondary | ICD-10-CM | POA: Diagnosis not present

## 2018-01-23 DIAGNOSIS — Z992 Dependence on renal dialysis: Secondary | ICD-10-CM | POA: Diagnosis not present

## 2018-01-23 DIAGNOSIS — D631 Anemia in chronic kidney disease: Secondary | ICD-10-CM | POA: Diagnosis not present

## 2018-01-23 DIAGNOSIS — D509 Iron deficiency anemia, unspecified: Secondary | ICD-10-CM | POA: Diagnosis not present

## 2018-01-23 DIAGNOSIS — N2581 Secondary hyperparathyroidism of renal origin: Secondary | ICD-10-CM | POA: Diagnosis not present

## 2018-01-28 DIAGNOSIS — Z992 Dependence on renal dialysis: Secondary | ICD-10-CM | POA: Diagnosis not present

## 2018-01-28 DIAGNOSIS — D509 Iron deficiency anemia, unspecified: Secondary | ICD-10-CM | POA: Diagnosis not present

## 2018-01-28 DIAGNOSIS — N2581 Secondary hyperparathyroidism of renal origin: Secondary | ICD-10-CM | POA: Diagnosis not present

## 2018-01-28 DIAGNOSIS — D631 Anemia in chronic kidney disease: Secondary | ICD-10-CM | POA: Diagnosis not present

## 2018-01-28 DIAGNOSIS — N186 End stage renal disease: Secondary | ICD-10-CM | POA: Diagnosis not present

## 2018-01-30 DIAGNOSIS — D509 Iron deficiency anemia, unspecified: Secondary | ICD-10-CM | POA: Diagnosis not present

## 2018-01-30 DIAGNOSIS — N2581 Secondary hyperparathyroidism of renal origin: Secondary | ICD-10-CM | POA: Diagnosis not present

## 2018-01-30 DIAGNOSIS — N186 End stage renal disease: Secondary | ICD-10-CM | POA: Diagnosis not present

## 2018-01-30 DIAGNOSIS — Z992 Dependence on renal dialysis: Secondary | ICD-10-CM | POA: Diagnosis not present

## 2018-01-30 DIAGNOSIS — D631 Anemia in chronic kidney disease: Secondary | ICD-10-CM | POA: Diagnosis not present

## 2018-02-01 DIAGNOSIS — N2581 Secondary hyperparathyroidism of renal origin: Secondary | ICD-10-CM | POA: Diagnosis not present

## 2018-02-01 DIAGNOSIS — N186 End stage renal disease: Secondary | ICD-10-CM | POA: Diagnosis not present

## 2018-02-01 DIAGNOSIS — D509 Iron deficiency anemia, unspecified: Secondary | ICD-10-CM | POA: Diagnosis not present

## 2018-02-01 DIAGNOSIS — Z992 Dependence on renal dialysis: Secondary | ICD-10-CM | POA: Diagnosis not present

## 2018-02-01 DIAGNOSIS — D631 Anemia in chronic kidney disease: Secondary | ICD-10-CM | POA: Diagnosis not present

## 2018-02-04 DIAGNOSIS — Z23 Encounter for immunization: Secondary | ICD-10-CM | POA: Diagnosis not present

## 2018-02-04 DIAGNOSIS — D509 Iron deficiency anemia, unspecified: Secondary | ICD-10-CM | POA: Diagnosis not present

## 2018-02-04 DIAGNOSIS — N186 End stage renal disease: Secondary | ICD-10-CM | POA: Diagnosis not present

## 2018-02-04 DIAGNOSIS — Z992 Dependence on renal dialysis: Secondary | ICD-10-CM | POA: Diagnosis not present

## 2018-02-04 DIAGNOSIS — N2581 Secondary hyperparathyroidism of renal origin: Secondary | ICD-10-CM | POA: Diagnosis not present

## 2018-02-04 DIAGNOSIS — D631 Anemia in chronic kidney disease: Secondary | ICD-10-CM | POA: Diagnosis not present

## 2018-02-06 DIAGNOSIS — Z23 Encounter for immunization: Secondary | ICD-10-CM | POA: Diagnosis not present

## 2018-02-06 DIAGNOSIS — D631 Anemia in chronic kidney disease: Secondary | ICD-10-CM | POA: Diagnosis not present

## 2018-02-06 DIAGNOSIS — N2581 Secondary hyperparathyroidism of renal origin: Secondary | ICD-10-CM | POA: Diagnosis not present

## 2018-02-06 DIAGNOSIS — D509 Iron deficiency anemia, unspecified: Secondary | ICD-10-CM | POA: Diagnosis not present

## 2018-02-06 DIAGNOSIS — N186 End stage renal disease: Secondary | ICD-10-CM | POA: Diagnosis not present

## 2018-02-06 DIAGNOSIS — Z992 Dependence on renal dialysis: Secondary | ICD-10-CM | POA: Diagnosis not present

## 2018-02-11 DIAGNOSIS — N2581 Secondary hyperparathyroidism of renal origin: Secondary | ICD-10-CM | POA: Diagnosis not present

## 2018-02-11 DIAGNOSIS — Z992 Dependence on renal dialysis: Secondary | ICD-10-CM | POA: Diagnosis not present

## 2018-02-11 DIAGNOSIS — Z23 Encounter for immunization: Secondary | ICD-10-CM | POA: Diagnosis not present

## 2018-02-11 DIAGNOSIS — D631 Anemia in chronic kidney disease: Secondary | ICD-10-CM | POA: Diagnosis not present

## 2018-02-11 DIAGNOSIS — D509 Iron deficiency anemia, unspecified: Secondary | ICD-10-CM | POA: Diagnosis not present

## 2018-02-11 DIAGNOSIS — N186 End stage renal disease: Secondary | ICD-10-CM | POA: Diagnosis not present

## 2018-02-13 DIAGNOSIS — N186 End stage renal disease: Secondary | ICD-10-CM | POA: Diagnosis not present

## 2018-02-13 DIAGNOSIS — D509 Iron deficiency anemia, unspecified: Secondary | ICD-10-CM | POA: Diagnosis not present

## 2018-02-13 DIAGNOSIS — D631 Anemia in chronic kidney disease: Secondary | ICD-10-CM | POA: Diagnosis not present

## 2018-02-13 DIAGNOSIS — N2581 Secondary hyperparathyroidism of renal origin: Secondary | ICD-10-CM | POA: Diagnosis not present

## 2018-02-13 DIAGNOSIS — Z992 Dependence on renal dialysis: Secondary | ICD-10-CM | POA: Diagnosis not present

## 2018-02-13 DIAGNOSIS — Z23 Encounter for immunization: Secondary | ICD-10-CM | POA: Diagnosis not present

## 2018-02-15 DIAGNOSIS — N186 End stage renal disease: Secondary | ICD-10-CM | POA: Diagnosis not present

## 2018-02-15 DIAGNOSIS — D631 Anemia in chronic kidney disease: Secondary | ICD-10-CM | POA: Diagnosis not present

## 2018-02-15 DIAGNOSIS — Z23 Encounter for immunization: Secondary | ICD-10-CM | POA: Diagnosis not present

## 2018-02-15 DIAGNOSIS — Z992 Dependence on renal dialysis: Secondary | ICD-10-CM | POA: Diagnosis not present

## 2018-02-15 DIAGNOSIS — N2581 Secondary hyperparathyroidism of renal origin: Secondary | ICD-10-CM | POA: Diagnosis not present

## 2018-02-15 DIAGNOSIS — D509 Iron deficiency anemia, unspecified: Secondary | ICD-10-CM | POA: Diagnosis not present

## 2018-02-18 DIAGNOSIS — N2581 Secondary hyperparathyroidism of renal origin: Secondary | ICD-10-CM | POA: Diagnosis not present

## 2018-02-18 DIAGNOSIS — Z23 Encounter for immunization: Secondary | ICD-10-CM | POA: Diagnosis not present

## 2018-02-18 DIAGNOSIS — D631 Anemia in chronic kidney disease: Secondary | ICD-10-CM | POA: Diagnosis not present

## 2018-02-18 DIAGNOSIS — D509 Iron deficiency anemia, unspecified: Secondary | ICD-10-CM | POA: Diagnosis not present

## 2018-02-18 DIAGNOSIS — N186 End stage renal disease: Secondary | ICD-10-CM | POA: Diagnosis not present

## 2018-02-18 DIAGNOSIS — Z992 Dependence on renal dialysis: Secondary | ICD-10-CM | POA: Diagnosis not present

## 2018-02-20 DIAGNOSIS — D631 Anemia in chronic kidney disease: Secondary | ICD-10-CM | POA: Diagnosis not present

## 2018-02-20 DIAGNOSIS — Z992 Dependence on renal dialysis: Secondary | ICD-10-CM | POA: Diagnosis not present

## 2018-02-20 DIAGNOSIS — D509 Iron deficiency anemia, unspecified: Secondary | ICD-10-CM | POA: Diagnosis not present

## 2018-02-20 DIAGNOSIS — N2581 Secondary hyperparathyroidism of renal origin: Secondary | ICD-10-CM | POA: Diagnosis not present

## 2018-02-20 DIAGNOSIS — Z23 Encounter for immunization: Secondary | ICD-10-CM | POA: Diagnosis not present

## 2018-02-20 DIAGNOSIS — N186 End stage renal disease: Secondary | ICD-10-CM | POA: Diagnosis not present

## 2018-02-22 DIAGNOSIS — Z23 Encounter for immunization: Secondary | ICD-10-CM | POA: Diagnosis not present

## 2018-02-22 DIAGNOSIS — N186 End stage renal disease: Secondary | ICD-10-CM | POA: Diagnosis not present

## 2018-02-22 DIAGNOSIS — D631 Anemia in chronic kidney disease: Secondary | ICD-10-CM | POA: Diagnosis not present

## 2018-02-22 DIAGNOSIS — N2581 Secondary hyperparathyroidism of renal origin: Secondary | ICD-10-CM | POA: Diagnosis not present

## 2018-02-22 DIAGNOSIS — D509 Iron deficiency anemia, unspecified: Secondary | ICD-10-CM | POA: Diagnosis not present

## 2018-02-22 DIAGNOSIS — Z992 Dependence on renal dialysis: Secondary | ICD-10-CM | POA: Diagnosis not present

## 2018-02-25 DIAGNOSIS — Z794 Long term (current) use of insulin: Secondary | ICD-10-CM | POA: Diagnosis not present

## 2018-02-25 DIAGNOSIS — N2581 Secondary hyperparathyroidism of renal origin: Secondary | ICD-10-CM | POA: Diagnosis not present

## 2018-02-25 DIAGNOSIS — E119 Type 2 diabetes mellitus without complications: Secondary | ICD-10-CM | POA: Diagnosis not present

## 2018-02-25 DIAGNOSIS — Z992 Dependence on renal dialysis: Secondary | ICD-10-CM | POA: Diagnosis not present

## 2018-02-25 DIAGNOSIS — Z23 Encounter for immunization: Secondary | ICD-10-CM | POA: Diagnosis not present

## 2018-02-25 DIAGNOSIS — D509 Iron deficiency anemia, unspecified: Secondary | ICD-10-CM | POA: Diagnosis not present

## 2018-02-25 DIAGNOSIS — D631 Anemia in chronic kidney disease: Secondary | ICD-10-CM | POA: Diagnosis not present

## 2018-02-25 DIAGNOSIS — N186 End stage renal disease: Secondary | ICD-10-CM | POA: Diagnosis not present

## 2018-02-27 DIAGNOSIS — D631 Anemia in chronic kidney disease: Secondary | ICD-10-CM | POA: Diagnosis not present

## 2018-02-27 DIAGNOSIS — N2581 Secondary hyperparathyroidism of renal origin: Secondary | ICD-10-CM | POA: Diagnosis not present

## 2018-02-27 DIAGNOSIS — Z23 Encounter for immunization: Secondary | ICD-10-CM | POA: Diagnosis not present

## 2018-02-27 DIAGNOSIS — N186 End stage renal disease: Secondary | ICD-10-CM | POA: Diagnosis not present

## 2018-02-27 DIAGNOSIS — D509 Iron deficiency anemia, unspecified: Secondary | ICD-10-CM | POA: Diagnosis not present

## 2018-02-27 DIAGNOSIS — Z992 Dependence on renal dialysis: Secondary | ICD-10-CM | POA: Diagnosis not present

## 2018-03-01 DIAGNOSIS — Z992 Dependence on renal dialysis: Secondary | ICD-10-CM | POA: Diagnosis not present

## 2018-03-01 DIAGNOSIS — N186 End stage renal disease: Secondary | ICD-10-CM | POA: Diagnosis not present

## 2018-03-01 DIAGNOSIS — D509 Iron deficiency anemia, unspecified: Secondary | ICD-10-CM | POA: Diagnosis not present

## 2018-03-01 DIAGNOSIS — D631 Anemia in chronic kidney disease: Secondary | ICD-10-CM | POA: Diagnosis not present

## 2018-03-01 DIAGNOSIS — N2581 Secondary hyperparathyroidism of renal origin: Secondary | ICD-10-CM | POA: Diagnosis not present

## 2018-03-01 DIAGNOSIS — Z23 Encounter for immunization: Secondary | ICD-10-CM | POA: Diagnosis not present

## 2018-03-04 DIAGNOSIS — N2581 Secondary hyperparathyroidism of renal origin: Secondary | ICD-10-CM | POA: Diagnosis not present

## 2018-03-04 DIAGNOSIS — D631 Anemia in chronic kidney disease: Secondary | ICD-10-CM | POA: Diagnosis not present

## 2018-03-04 DIAGNOSIS — D509 Iron deficiency anemia, unspecified: Secondary | ICD-10-CM | POA: Diagnosis not present

## 2018-03-04 DIAGNOSIS — Z23 Encounter for immunization: Secondary | ICD-10-CM | POA: Diagnosis not present

## 2018-03-04 DIAGNOSIS — N186 End stage renal disease: Secondary | ICD-10-CM | POA: Diagnosis not present

## 2018-03-04 DIAGNOSIS — Z992 Dependence on renal dialysis: Secondary | ICD-10-CM | POA: Diagnosis not present

## 2018-03-06 DIAGNOSIS — N2581 Secondary hyperparathyroidism of renal origin: Secondary | ICD-10-CM | POA: Diagnosis not present

## 2018-03-06 DIAGNOSIS — Z992 Dependence on renal dialysis: Secondary | ICD-10-CM | POA: Diagnosis not present

## 2018-03-06 DIAGNOSIS — D509 Iron deficiency anemia, unspecified: Secondary | ICD-10-CM | POA: Diagnosis not present

## 2018-03-06 DIAGNOSIS — N186 End stage renal disease: Secondary | ICD-10-CM | POA: Diagnosis not present

## 2018-03-06 DIAGNOSIS — D631 Anemia in chronic kidney disease: Secondary | ICD-10-CM | POA: Diagnosis not present

## 2018-03-08 DIAGNOSIS — N2581 Secondary hyperparathyroidism of renal origin: Secondary | ICD-10-CM | POA: Diagnosis not present

## 2018-03-08 DIAGNOSIS — Z992 Dependence on renal dialysis: Secondary | ICD-10-CM | POA: Diagnosis not present

## 2018-03-08 DIAGNOSIS — D631 Anemia in chronic kidney disease: Secondary | ICD-10-CM | POA: Diagnosis not present

## 2018-03-08 DIAGNOSIS — N186 End stage renal disease: Secondary | ICD-10-CM | POA: Diagnosis not present

## 2018-03-08 DIAGNOSIS — D509 Iron deficiency anemia, unspecified: Secondary | ICD-10-CM | POA: Diagnosis not present

## 2018-03-11 DIAGNOSIS — N186 End stage renal disease: Secondary | ICD-10-CM | POA: Diagnosis not present

## 2018-03-11 DIAGNOSIS — N2581 Secondary hyperparathyroidism of renal origin: Secondary | ICD-10-CM | POA: Diagnosis not present

## 2018-03-11 DIAGNOSIS — Z992 Dependence on renal dialysis: Secondary | ICD-10-CM | POA: Diagnosis not present

## 2018-03-11 DIAGNOSIS — D509 Iron deficiency anemia, unspecified: Secondary | ICD-10-CM | POA: Diagnosis not present

## 2018-03-11 DIAGNOSIS — D631 Anemia in chronic kidney disease: Secondary | ICD-10-CM | POA: Diagnosis not present

## 2018-03-13 DIAGNOSIS — N2581 Secondary hyperparathyroidism of renal origin: Secondary | ICD-10-CM | POA: Diagnosis not present

## 2018-03-13 DIAGNOSIS — D509 Iron deficiency anemia, unspecified: Secondary | ICD-10-CM | POA: Diagnosis not present

## 2018-03-13 DIAGNOSIS — N186 End stage renal disease: Secondary | ICD-10-CM | POA: Diagnosis not present

## 2018-03-13 DIAGNOSIS — Z992 Dependence on renal dialysis: Secondary | ICD-10-CM | POA: Diagnosis not present

## 2018-03-13 DIAGNOSIS — D631 Anemia in chronic kidney disease: Secondary | ICD-10-CM | POA: Diagnosis not present

## 2018-03-18 DIAGNOSIS — D509 Iron deficiency anemia, unspecified: Secondary | ICD-10-CM | POA: Diagnosis not present

## 2018-03-18 DIAGNOSIS — N2581 Secondary hyperparathyroidism of renal origin: Secondary | ICD-10-CM | POA: Diagnosis not present

## 2018-03-18 DIAGNOSIS — Z992 Dependence on renal dialysis: Secondary | ICD-10-CM | POA: Diagnosis not present

## 2018-03-18 DIAGNOSIS — D631 Anemia in chronic kidney disease: Secondary | ICD-10-CM | POA: Diagnosis not present

## 2018-03-18 DIAGNOSIS — N186 End stage renal disease: Secondary | ICD-10-CM | POA: Diagnosis not present

## 2018-03-20 DIAGNOSIS — N186 End stage renal disease: Secondary | ICD-10-CM | POA: Diagnosis not present

## 2018-03-20 DIAGNOSIS — D631 Anemia in chronic kidney disease: Secondary | ICD-10-CM | POA: Diagnosis not present

## 2018-03-20 DIAGNOSIS — Z992 Dependence on renal dialysis: Secondary | ICD-10-CM | POA: Diagnosis not present

## 2018-03-20 DIAGNOSIS — D509 Iron deficiency anemia, unspecified: Secondary | ICD-10-CM | POA: Diagnosis not present

## 2018-03-20 DIAGNOSIS — N2581 Secondary hyperparathyroidism of renal origin: Secondary | ICD-10-CM | POA: Diagnosis not present

## 2018-03-22 DIAGNOSIS — N186 End stage renal disease: Secondary | ICD-10-CM | POA: Diagnosis not present

## 2018-03-22 DIAGNOSIS — Z992 Dependence on renal dialysis: Secondary | ICD-10-CM | POA: Diagnosis not present

## 2018-03-22 DIAGNOSIS — N2581 Secondary hyperparathyroidism of renal origin: Secondary | ICD-10-CM | POA: Diagnosis not present

## 2018-03-22 DIAGNOSIS — D631 Anemia in chronic kidney disease: Secondary | ICD-10-CM | POA: Diagnosis not present

## 2018-03-22 DIAGNOSIS — D509 Iron deficiency anemia, unspecified: Secondary | ICD-10-CM | POA: Diagnosis not present

## 2018-03-25 DIAGNOSIS — N186 End stage renal disease: Secondary | ICD-10-CM | POA: Diagnosis not present

## 2018-03-25 DIAGNOSIS — N2581 Secondary hyperparathyroidism of renal origin: Secondary | ICD-10-CM | POA: Diagnosis not present

## 2018-03-25 DIAGNOSIS — Z992 Dependence on renal dialysis: Secondary | ICD-10-CM | POA: Diagnosis not present

## 2018-03-25 DIAGNOSIS — D631 Anemia in chronic kidney disease: Secondary | ICD-10-CM | POA: Diagnosis not present

## 2018-03-25 DIAGNOSIS — D509 Iron deficiency anemia, unspecified: Secondary | ICD-10-CM | POA: Diagnosis not present

## 2018-03-27 DIAGNOSIS — N2581 Secondary hyperparathyroidism of renal origin: Secondary | ICD-10-CM | POA: Diagnosis not present

## 2018-03-27 DIAGNOSIS — N186 End stage renal disease: Secondary | ICD-10-CM | POA: Diagnosis not present

## 2018-03-27 DIAGNOSIS — D631 Anemia in chronic kidney disease: Secondary | ICD-10-CM | POA: Diagnosis not present

## 2018-03-27 DIAGNOSIS — Z992 Dependence on renal dialysis: Secondary | ICD-10-CM | POA: Diagnosis not present

## 2018-03-27 DIAGNOSIS — D509 Iron deficiency anemia, unspecified: Secondary | ICD-10-CM | POA: Diagnosis not present

## 2018-03-29 DIAGNOSIS — D509 Iron deficiency anemia, unspecified: Secondary | ICD-10-CM | POA: Diagnosis not present

## 2018-03-29 DIAGNOSIS — N2581 Secondary hyperparathyroidism of renal origin: Secondary | ICD-10-CM | POA: Diagnosis not present

## 2018-03-29 DIAGNOSIS — Z992 Dependence on renal dialysis: Secondary | ICD-10-CM | POA: Diagnosis not present

## 2018-03-29 DIAGNOSIS — N186 End stage renal disease: Secondary | ICD-10-CM | POA: Diagnosis not present

## 2018-03-29 DIAGNOSIS — D631 Anemia in chronic kidney disease: Secondary | ICD-10-CM | POA: Diagnosis not present

## 2018-04-01 DIAGNOSIS — N186 End stage renal disease: Secondary | ICD-10-CM | POA: Diagnosis not present

## 2018-04-01 DIAGNOSIS — N2581 Secondary hyperparathyroidism of renal origin: Secondary | ICD-10-CM | POA: Diagnosis not present

## 2018-04-01 DIAGNOSIS — D631 Anemia in chronic kidney disease: Secondary | ICD-10-CM | POA: Diagnosis not present

## 2018-04-01 DIAGNOSIS — Z992 Dependence on renal dialysis: Secondary | ICD-10-CM | POA: Diagnosis not present

## 2018-04-01 DIAGNOSIS — D509 Iron deficiency anemia, unspecified: Secondary | ICD-10-CM | POA: Diagnosis not present

## 2018-04-03 DIAGNOSIS — D631 Anemia in chronic kidney disease: Secondary | ICD-10-CM | POA: Diagnosis not present

## 2018-04-03 DIAGNOSIS — N2581 Secondary hyperparathyroidism of renal origin: Secondary | ICD-10-CM | POA: Diagnosis not present

## 2018-04-03 DIAGNOSIS — Z992 Dependence on renal dialysis: Secondary | ICD-10-CM | POA: Diagnosis not present

## 2018-04-03 DIAGNOSIS — D509 Iron deficiency anemia, unspecified: Secondary | ICD-10-CM | POA: Diagnosis not present

## 2018-04-03 DIAGNOSIS — N186 End stage renal disease: Secondary | ICD-10-CM | POA: Diagnosis not present

## 2018-04-04 DIAGNOSIS — Z992 Dependence on renal dialysis: Secondary | ICD-10-CM | POA: Diagnosis not present

## 2018-04-04 DIAGNOSIS — N186 End stage renal disease: Secondary | ICD-10-CM | POA: Diagnosis not present

## 2018-04-05 DIAGNOSIS — Z992 Dependence on renal dialysis: Secondary | ICD-10-CM | POA: Diagnosis not present

## 2018-04-05 DIAGNOSIS — N2581 Secondary hyperparathyroidism of renal origin: Secondary | ICD-10-CM | POA: Diagnosis not present

## 2018-04-05 DIAGNOSIS — D509 Iron deficiency anemia, unspecified: Secondary | ICD-10-CM | POA: Diagnosis not present

## 2018-04-05 DIAGNOSIS — D631 Anemia in chronic kidney disease: Secondary | ICD-10-CM | POA: Diagnosis not present

## 2018-04-05 DIAGNOSIS — N186 End stage renal disease: Secondary | ICD-10-CM | POA: Diagnosis not present

## 2018-04-08 DIAGNOSIS — D509 Iron deficiency anemia, unspecified: Secondary | ICD-10-CM | POA: Diagnosis not present

## 2018-04-08 DIAGNOSIS — Z992 Dependence on renal dialysis: Secondary | ICD-10-CM | POA: Diagnosis not present

## 2018-04-08 DIAGNOSIS — N186 End stage renal disease: Secondary | ICD-10-CM | POA: Diagnosis not present

## 2018-04-08 DIAGNOSIS — N2581 Secondary hyperparathyroidism of renal origin: Secondary | ICD-10-CM | POA: Diagnosis not present

## 2018-04-08 DIAGNOSIS — D631 Anemia in chronic kidney disease: Secondary | ICD-10-CM | POA: Diagnosis not present

## 2018-04-10 DIAGNOSIS — N2581 Secondary hyperparathyroidism of renal origin: Secondary | ICD-10-CM | POA: Diagnosis not present

## 2018-04-10 DIAGNOSIS — D509 Iron deficiency anemia, unspecified: Secondary | ICD-10-CM | POA: Diagnosis not present

## 2018-04-10 DIAGNOSIS — N186 End stage renal disease: Secondary | ICD-10-CM | POA: Diagnosis not present

## 2018-04-10 DIAGNOSIS — Z992 Dependence on renal dialysis: Secondary | ICD-10-CM | POA: Diagnosis not present

## 2018-04-10 DIAGNOSIS — D631 Anemia in chronic kidney disease: Secondary | ICD-10-CM | POA: Diagnosis not present

## 2018-04-12 DIAGNOSIS — N2581 Secondary hyperparathyroidism of renal origin: Secondary | ICD-10-CM | POA: Diagnosis not present

## 2018-04-12 DIAGNOSIS — N186 End stage renal disease: Secondary | ICD-10-CM | POA: Diagnosis not present

## 2018-04-12 DIAGNOSIS — Z992 Dependence on renal dialysis: Secondary | ICD-10-CM | POA: Diagnosis not present

## 2018-04-12 DIAGNOSIS — D631 Anemia in chronic kidney disease: Secondary | ICD-10-CM | POA: Diagnosis not present

## 2018-04-12 DIAGNOSIS — D509 Iron deficiency anemia, unspecified: Secondary | ICD-10-CM | POA: Diagnosis not present

## 2018-04-17 DIAGNOSIS — D509 Iron deficiency anemia, unspecified: Secondary | ICD-10-CM | POA: Diagnosis not present

## 2018-04-17 DIAGNOSIS — N2581 Secondary hyperparathyroidism of renal origin: Secondary | ICD-10-CM | POA: Diagnosis not present

## 2018-04-17 DIAGNOSIS — N186 End stage renal disease: Secondary | ICD-10-CM | POA: Diagnosis not present

## 2018-04-17 DIAGNOSIS — Z992 Dependence on renal dialysis: Secondary | ICD-10-CM | POA: Diagnosis not present

## 2018-04-17 DIAGNOSIS — D631 Anemia in chronic kidney disease: Secondary | ICD-10-CM | POA: Diagnosis not present

## 2018-04-19 DIAGNOSIS — N2581 Secondary hyperparathyroidism of renal origin: Secondary | ICD-10-CM | POA: Diagnosis not present

## 2018-04-19 DIAGNOSIS — D509 Iron deficiency anemia, unspecified: Secondary | ICD-10-CM | POA: Diagnosis not present

## 2018-04-19 DIAGNOSIS — Z992 Dependence on renal dialysis: Secondary | ICD-10-CM | POA: Diagnosis not present

## 2018-04-19 DIAGNOSIS — N186 End stage renal disease: Secondary | ICD-10-CM | POA: Diagnosis not present

## 2018-04-19 DIAGNOSIS — D631 Anemia in chronic kidney disease: Secondary | ICD-10-CM | POA: Diagnosis not present

## 2018-04-22 DIAGNOSIS — Z992 Dependence on renal dialysis: Secondary | ICD-10-CM | POA: Diagnosis not present

## 2018-04-22 DIAGNOSIS — N2581 Secondary hyperparathyroidism of renal origin: Secondary | ICD-10-CM | POA: Diagnosis not present

## 2018-04-22 DIAGNOSIS — D509 Iron deficiency anemia, unspecified: Secondary | ICD-10-CM | POA: Diagnosis not present

## 2018-04-22 DIAGNOSIS — N186 End stage renal disease: Secondary | ICD-10-CM | POA: Diagnosis not present

## 2018-04-22 DIAGNOSIS — D631 Anemia in chronic kidney disease: Secondary | ICD-10-CM | POA: Diagnosis not present

## 2018-04-24 DIAGNOSIS — D631 Anemia in chronic kidney disease: Secondary | ICD-10-CM | POA: Diagnosis not present

## 2018-04-24 DIAGNOSIS — Z992 Dependence on renal dialysis: Secondary | ICD-10-CM | POA: Diagnosis not present

## 2018-04-24 DIAGNOSIS — N186 End stage renal disease: Secondary | ICD-10-CM | POA: Diagnosis not present

## 2018-04-24 DIAGNOSIS — N2581 Secondary hyperparathyroidism of renal origin: Secondary | ICD-10-CM | POA: Diagnosis not present

## 2018-04-24 DIAGNOSIS — D509 Iron deficiency anemia, unspecified: Secondary | ICD-10-CM | POA: Diagnosis not present

## 2018-04-26 DIAGNOSIS — N2581 Secondary hyperparathyroidism of renal origin: Secondary | ICD-10-CM | POA: Diagnosis not present

## 2018-04-26 DIAGNOSIS — D509 Iron deficiency anemia, unspecified: Secondary | ICD-10-CM | POA: Diagnosis not present

## 2018-04-26 DIAGNOSIS — D631 Anemia in chronic kidney disease: Secondary | ICD-10-CM | POA: Diagnosis not present

## 2018-04-26 DIAGNOSIS — Z992 Dependence on renal dialysis: Secondary | ICD-10-CM | POA: Diagnosis not present

## 2018-04-26 DIAGNOSIS — N186 End stage renal disease: Secondary | ICD-10-CM | POA: Diagnosis not present

## 2018-04-29 DIAGNOSIS — N2581 Secondary hyperparathyroidism of renal origin: Secondary | ICD-10-CM | POA: Diagnosis not present

## 2018-04-29 DIAGNOSIS — D631 Anemia in chronic kidney disease: Secondary | ICD-10-CM | POA: Diagnosis not present

## 2018-04-29 DIAGNOSIS — Z992 Dependence on renal dialysis: Secondary | ICD-10-CM | POA: Diagnosis not present

## 2018-04-29 DIAGNOSIS — N186 End stage renal disease: Secondary | ICD-10-CM | POA: Diagnosis not present

## 2018-04-29 DIAGNOSIS — D509 Iron deficiency anemia, unspecified: Secondary | ICD-10-CM | POA: Diagnosis not present

## 2018-05-01 DIAGNOSIS — Z992 Dependence on renal dialysis: Secondary | ICD-10-CM | POA: Diagnosis not present

## 2018-05-01 DIAGNOSIS — N186 End stage renal disease: Secondary | ICD-10-CM | POA: Diagnosis not present

## 2018-05-01 DIAGNOSIS — N2581 Secondary hyperparathyroidism of renal origin: Secondary | ICD-10-CM | POA: Diagnosis not present

## 2018-05-01 DIAGNOSIS — D631 Anemia in chronic kidney disease: Secondary | ICD-10-CM | POA: Diagnosis not present

## 2018-05-01 DIAGNOSIS — D509 Iron deficiency anemia, unspecified: Secondary | ICD-10-CM | POA: Diagnosis not present

## 2018-05-04 DIAGNOSIS — N186 End stage renal disease: Secondary | ICD-10-CM | POA: Diagnosis not present

## 2018-05-04 DIAGNOSIS — Z992 Dependence on renal dialysis: Secondary | ICD-10-CM | POA: Diagnosis not present

## 2018-05-06 DIAGNOSIS — N2581 Secondary hyperparathyroidism of renal origin: Secondary | ICD-10-CM | POA: Diagnosis not present

## 2018-05-06 DIAGNOSIS — D631 Anemia in chronic kidney disease: Secondary | ICD-10-CM | POA: Diagnosis not present

## 2018-05-06 DIAGNOSIS — N186 End stage renal disease: Secondary | ICD-10-CM | POA: Diagnosis not present

## 2018-05-06 DIAGNOSIS — D509 Iron deficiency anemia, unspecified: Secondary | ICD-10-CM | POA: Diagnosis not present

## 2018-05-06 DIAGNOSIS — Z992 Dependence on renal dialysis: Secondary | ICD-10-CM | POA: Diagnosis not present

## 2018-05-08 DIAGNOSIS — Z992 Dependence on renal dialysis: Secondary | ICD-10-CM | POA: Diagnosis not present

## 2018-05-08 DIAGNOSIS — D509 Iron deficiency anemia, unspecified: Secondary | ICD-10-CM | POA: Diagnosis not present

## 2018-05-08 DIAGNOSIS — D631 Anemia in chronic kidney disease: Secondary | ICD-10-CM | POA: Diagnosis not present

## 2018-05-08 DIAGNOSIS — N2581 Secondary hyperparathyroidism of renal origin: Secondary | ICD-10-CM | POA: Diagnosis not present

## 2018-05-08 DIAGNOSIS — N186 End stage renal disease: Secondary | ICD-10-CM | POA: Diagnosis not present

## 2018-05-13 DIAGNOSIS — N186 End stage renal disease: Secondary | ICD-10-CM | POA: Diagnosis not present

## 2018-05-13 DIAGNOSIS — N2581 Secondary hyperparathyroidism of renal origin: Secondary | ICD-10-CM | POA: Diagnosis not present

## 2018-05-13 DIAGNOSIS — D631 Anemia in chronic kidney disease: Secondary | ICD-10-CM | POA: Diagnosis not present

## 2018-05-13 DIAGNOSIS — Z992 Dependence on renal dialysis: Secondary | ICD-10-CM | POA: Diagnosis not present

## 2018-05-13 DIAGNOSIS — D509 Iron deficiency anemia, unspecified: Secondary | ICD-10-CM | POA: Diagnosis not present

## 2018-05-15 DIAGNOSIS — D631 Anemia in chronic kidney disease: Secondary | ICD-10-CM | POA: Diagnosis not present

## 2018-05-15 DIAGNOSIS — N2581 Secondary hyperparathyroidism of renal origin: Secondary | ICD-10-CM | POA: Diagnosis not present

## 2018-05-15 DIAGNOSIS — Z992 Dependence on renal dialysis: Secondary | ICD-10-CM | POA: Diagnosis not present

## 2018-05-15 DIAGNOSIS — D509 Iron deficiency anemia, unspecified: Secondary | ICD-10-CM | POA: Diagnosis not present

## 2018-05-15 DIAGNOSIS — N186 End stage renal disease: Secondary | ICD-10-CM | POA: Diagnosis not present

## 2018-05-17 DIAGNOSIS — N186 End stage renal disease: Secondary | ICD-10-CM | POA: Diagnosis not present

## 2018-05-17 DIAGNOSIS — N2581 Secondary hyperparathyroidism of renal origin: Secondary | ICD-10-CM | POA: Diagnosis not present

## 2018-05-17 DIAGNOSIS — D631 Anemia in chronic kidney disease: Secondary | ICD-10-CM | POA: Diagnosis not present

## 2018-05-17 DIAGNOSIS — D509 Iron deficiency anemia, unspecified: Secondary | ICD-10-CM | POA: Diagnosis not present

## 2018-05-17 DIAGNOSIS — Z992 Dependence on renal dialysis: Secondary | ICD-10-CM | POA: Diagnosis not present

## 2018-05-22 DIAGNOSIS — N186 End stage renal disease: Secondary | ICD-10-CM | POA: Diagnosis not present

## 2018-05-22 DIAGNOSIS — N2581 Secondary hyperparathyroidism of renal origin: Secondary | ICD-10-CM | POA: Diagnosis not present

## 2018-05-22 DIAGNOSIS — D631 Anemia in chronic kidney disease: Secondary | ICD-10-CM | POA: Diagnosis not present

## 2018-05-22 DIAGNOSIS — Z992 Dependence on renal dialysis: Secondary | ICD-10-CM | POA: Diagnosis not present

## 2018-05-22 DIAGNOSIS — D509 Iron deficiency anemia, unspecified: Secondary | ICD-10-CM | POA: Diagnosis not present

## 2018-05-24 DIAGNOSIS — N2581 Secondary hyperparathyroidism of renal origin: Secondary | ICD-10-CM | POA: Diagnosis not present

## 2018-05-24 DIAGNOSIS — D631 Anemia in chronic kidney disease: Secondary | ICD-10-CM | POA: Diagnosis not present

## 2018-05-24 DIAGNOSIS — N186 End stage renal disease: Secondary | ICD-10-CM | POA: Diagnosis not present

## 2018-05-24 DIAGNOSIS — Z992 Dependence on renal dialysis: Secondary | ICD-10-CM | POA: Diagnosis not present

## 2018-05-24 DIAGNOSIS — D509 Iron deficiency anemia, unspecified: Secondary | ICD-10-CM | POA: Diagnosis not present

## 2018-05-26 DIAGNOSIS — N2581 Secondary hyperparathyroidism of renal origin: Secondary | ICD-10-CM | POA: Diagnosis not present

## 2018-05-26 DIAGNOSIS — D509 Iron deficiency anemia, unspecified: Secondary | ICD-10-CM | POA: Diagnosis not present

## 2018-05-26 DIAGNOSIS — Z992 Dependence on renal dialysis: Secondary | ICD-10-CM | POA: Diagnosis not present

## 2018-05-26 DIAGNOSIS — N186 End stage renal disease: Secondary | ICD-10-CM | POA: Diagnosis not present

## 2018-05-26 DIAGNOSIS — D631 Anemia in chronic kidney disease: Secondary | ICD-10-CM | POA: Diagnosis not present

## 2018-05-28 DIAGNOSIS — N186 End stage renal disease: Secondary | ICD-10-CM | POA: Diagnosis not present

## 2018-05-28 DIAGNOSIS — N2581 Secondary hyperparathyroidism of renal origin: Secondary | ICD-10-CM | POA: Diagnosis not present

## 2018-05-28 DIAGNOSIS — D631 Anemia in chronic kidney disease: Secondary | ICD-10-CM | POA: Diagnosis not present

## 2018-05-28 DIAGNOSIS — D509 Iron deficiency anemia, unspecified: Secondary | ICD-10-CM | POA: Diagnosis not present

## 2018-05-28 DIAGNOSIS — Z992 Dependence on renal dialysis: Secondary | ICD-10-CM | POA: Diagnosis not present

## 2018-05-31 DIAGNOSIS — N2581 Secondary hyperparathyroidism of renal origin: Secondary | ICD-10-CM | POA: Diagnosis not present

## 2018-05-31 DIAGNOSIS — Z794 Long term (current) use of insulin: Secondary | ICD-10-CM | POA: Diagnosis not present

## 2018-05-31 DIAGNOSIS — N186 End stage renal disease: Secondary | ICD-10-CM | POA: Diagnosis not present

## 2018-05-31 DIAGNOSIS — D509 Iron deficiency anemia, unspecified: Secondary | ICD-10-CM | POA: Diagnosis not present

## 2018-05-31 DIAGNOSIS — Z992 Dependence on renal dialysis: Secondary | ICD-10-CM | POA: Diagnosis not present

## 2018-05-31 DIAGNOSIS — E119 Type 2 diabetes mellitus without complications: Secondary | ICD-10-CM | POA: Diagnosis not present

## 2018-05-31 DIAGNOSIS — D631 Anemia in chronic kidney disease: Secondary | ICD-10-CM | POA: Diagnosis not present

## 2018-06-03 DIAGNOSIS — D631 Anemia in chronic kidney disease: Secondary | ICD-10-CM | POA: Diagnosis not present

## 2018-06-03 DIAGNOSIS — Z992 Dependence on renal dialysis: Secondary | ICD-10-CM | POA: Diagnosis not present

## 2018-06-03 DIAGNOSIS — N2581 Secondary hyperparathyroidism of renal origin: Secondary | ICD-10-CM | POA: Diagnosis not present

## 2018-06-03 DIAGNOSIS — N186 End stage renal disease: Secondary | ICD-10-CM | POA: Diagnosis not present

## 2018-06-03 DIAGNOSIS — D509 Iron deficiency anemia, unspecified: Secondary | ICD-10-CM | POA: Diagnosis not present

## 2018-06-04 DIAGNOSIS — N186 End stage renal disease: Secondary | ICD-10-CM | POA: Diagnosis not present

## 2018-06-04 DIAGNOSIS — Z992 Dependence on renal dialysis: Secondary | ICD-10-CM | POA: Diagnosis not present

## 2018-06-07 DIAGNOSIS — N2581 Secondary hyperparathyroidism of renal origin: Secondary | ICD-10-CM | POA: Diagnosis not present

## 2018-06-07 DIAGNOSIS — N186 End stage renal disease: Secondary | ICD-10-CM | POA: Diagnosis not present

## 2018-06-07 DIAGNOSIS — Z992 Dependence on renal dialysis: Secondary | ICD-10-CM | POA: Diagnosis not present

## 2018-06-07 DIAGNOSIS — D509 Iron deficiency anemia, unspecified: Secondary | ICD-10-CM | POA: Diagnosis not present

## 2018-06-07 DIAGNOSIS — D631 Anemia in chronic kidney disease: Secondary | ICD-10-CM | POA: Diagnosis not present

## 2018-06-10 DIAGNOSIS — D509 Iron deficiency anemia, unspecified: Secondary | ICD-10-CM | POA: Diagnosis not present

## 2018-06-10 DIAGNOSIS — N2581 Secondary hyperparathyroidism of renal origin: Secondary | ICD-10-CM | POA: Diagnosis not present

## 2018-06-10 DIAGNOSIS — D631 Anemia in chronic kidney disease: Secondary | ICD-10-CM | POA: Diagnosis not present

## 2018-06-10 DIAGNOSIS — N186 End stage renal disease: Secondary | ICD-10-CM | POA: Diagnosis not present

## 2018-06-10 DIAGNOSIS — Z992 Dependence on renal dialysis: Secondary | ICD-10-CM | POA: Diagnosis not present

## 2018-06-12 DIAGNOSIS — N2581 Secondary hyperparathyroidism of renal origin: Secondary | ICD-10-CM | POA: Diagnosis not present

## 2018-06-12 DIAGNOSIS — D631 Anemia in chronic kidney disease: Secondary | ICD-10-CM | POA: Diagnosis not present

## 2018-06-12 DIAGNOSIS — N186 End stage renal disease: Secondary | ICD-10-CM | POA: Diagnosis not present

## 2018-06-12 DIAGNOSIS — Z992 Dependence on renal dialysis: Secondary | ICD-10-CM | POA: Diagnosis not present

## 2018-06-12 DIAGNOSIS — D509 Iron deficiency anemia, unspecified: Secondary | ICD-10-CM | POA: Diagnosis not present

## 2018-06-14 DIAGNOSIS — Z992 Dependence on renal dialysis: Secondary | ICD-10-CM | POA: Diagnosis not present

## 2018-06-14 DIAGNOSIS — N186 End stage renal disease: Secondary | ICD-10-CM | POA: Diagnosis not present

## 2018-06-14 DIAGNOSIS — N2581 Secondary hyperparathyroidism of renal origin: Secondary | ICD-10-CM | POA: Diagnosis not present

## 2018-06-14 DIAGNOSIS — D509 Iron deficiency anemia, unspecified: Secondary | ICD-10-CM | POA: Diagnosis not present

## 2018-06-14 DIAGNOSIS — D631 Anemia in chronic kidney disease: Secondary | ICD-10-CM | POA: Diagnosis not present

## 2018-06-17 DIAGNOSIS — Z992 Dependence on renal dialysis: Secondary | ICD-10-CM | POA: Diagnosis not present

## 2018-06-17 DIAGNOSIS — D509 Iron deficiency anemia, unspecified: Secondary | ICD-10-CM | POA: Diagnosis not present

## 2018-06-17 DIAGNOSIS — N2581 Secondary hyperparathyroidism of renal origin: Secondary | ICD-10-CM | POA: Diagnosis not present

## 2018-06-17 DIAGNOSIS — D631 Anemia in chronic kidney disease: Secondary | ICD-10-CM | POA: Diagnosis not present

## 2018-06-17 DIAGNOSIS — N186 End stage renal disease: Secondary | ICD-10-CM | POA: Diagnosis not present

## 2018-06-19 DIAGNOSIS — N2581 Secondary hyperparathyroidism of renal origin: Secondary | ICD-10-CM | POA: Diagnosis not present

## 2018-06-19 DIAGNOSIS — D631 Anemia in chronic kidney disease: Secondary | ICD-10-CM | POA: Diagnosis not present

## 2018-06-19 DIAGNOSIS — D509 Iron deficiency anemia, unspecified: Secondary | ICD-10-CM | POA: Diagnosis not present

## 2018-06-19 DIAGNOSIS — Z992 Dependence on renal dialysis: Secondary | ICD-10-CM | POA: Diagnosis not present

## 2018-06-19 DIAGNOSIS — N186 End stage renal disease: Secondary | ICD-10-CM | POA: Diagnosis not present

## 2018-06-21 DIAGNOSIS — N2581 Secondary hyperparathyroidism of renal origin: Secondary | ICD-10-CM | POA: Diagnosis not present

## 2018-06-21 DIAGNOSIS — D631 Anemia in chronic kidney disease: Secondary | ICD-10-CM | POA: Diagnosis not present

## 2018-06-21 DIAGNOSIS — N186 End stage renal disease: Secondary | ICD-10-CM | POA: Diagnosis not present

## 2018-06-21 DIAGNOSIS — Z992 Dependence on renal dialysis: Secondary | ICD-10-CM | POA: Diagnosis not present

## 2018-06-21 DIAGNOSIS — D509 Iron deficiency anemia, unspecified: Secondary | ICD-10-CM | POA: Diagnosis not present

## 2018-06-24 DIAGNOSIS — D509 Iron deficiency anemia, unspecified: Secondary | ICD-10-CM | POA: Diagnosis not present

## 2018-06-24 DIAGNOSIS — N186 End stage renal disease: Secondary | ICD-10-CM | POA: Diagnosis not present

## 2018-06-24 DIAGNOSIS — Z992 Dependence on renal dialysis: Secondary | ICD-10-CM | POA: Diagnosis not present

## 2018-06-24 DIAGNOSIS — N2581 Secondary hyperparathyroidism of renal origin: Secondary | ICD-10-CM | POA: Diagnosis not present

## 2018-06-24 DIAGNOSIS — D631 Anemia in chronic kidney disease: Secondary | ICD-10-CM | POA: Diagnosis not present

## 2018-06-26 DIAGNOSIS — D631 Anemia in chronic kidney disease: Secondary | ICD-10-CM | POA: Diagnosis not present

## 2018-06-26 DIAGNOSIS — Z992 Dependence on renal dialysis: Secondary | ICD-10-CM | POA: Diagnosis not present

## 2018-06-26 DIAGNOSIS — D509 Iron deficiency anemia, unspecified: Secondary | ICD-10-CM | POA: Diagnosis not present

## 2018-06-26 DIAGNOSIS — N186 End stage renal disease: Secondary | ICD-10-CM | POA: Diagnosis not present

## 2018-06-26 DIAGNOSIS — N2581 Secondary hyperparathyroidism of renal origin: Secondary | ICD-10-CM | POA: Diagnosis not present

## 2018-06-28 DIAGNOSIS — D631 Anemia in chronic kidney disease: Secondary | ICD-10-CM | POA: Diagnosis not present

## 2018-06-28 DIAGNOSIS — N186 End stage renal disease: Secondary | ICD-10-CM | POA: Diagnosis not present

## 2018-06-28 DIAGNOSIS — D509 Iron deficiency anemia, unspecified: Secondary | ICD-10-CM | POA: Diagnosis not present

## 2018-06-28 DIAGNOSIS — Z992 Dependence on renal dialysis: Secondary | ICD-10-CM | POA: Diagnosis not present

## 2018-06-28 DIAGNOSIS — N2581 Secondary hyperparathyroidism of renal origin: Secondary | ICD-10-CM | POA: Diagnosis not present

## 2018-07-01 DIAGNOSIS — D509 Iron deficiency anemia, unspecified: Secondary | ICD-10-CM | POA: Diagnosis not present

## 2018-07-01 DIAGNOSIS — Z992 Dependence on renal dialysis: Secondary | ICD-10-CM | POA: Diagnosis not present

## 2018-07-01 DIAGNOSIS — N2581 Secondary hyperparathyroidism of renal origin: Secondary | ICD-10-CM | POA: Diagnosis not present

## 2018-07-01 DIAGNOSIS — N186 End stage renal disease: Secondary | ICD-10-CM | POA: Diagnosis not present

## 2018-07-01 DIAGNOSIS — D631 Anemia in chronic kidney disease: Secondary | ICD-10-CM | POA: Diagnosis not present

## 2018-07-03 DIAGNOSIS — N2581 Secondary hyperparathyroidism of renal origin: Secondary | ICD-10-CM | POA: Diagnosis not present

## 2018-07-03 DIAGNOSIS — N186 End stage renal disease: Secondary | ICD-10-CM | POA: Diagnosis not present

## 2018-07-03 DIAGNOSIS — Z992 Dependence on renal dialysis: Secondary | ICD-10-CM | POA: Diagnosis not present

## 2018-07-03 DIAGNOSIS — D509 Iron deficiency anemia, unspecified: Secondary | ICD-10-CM | POA: Diagnosis not present

## 2018-07-03 DIAGNOSIS — D631 Anemia in chronic kidney disease: Secondary | ICD-10-CM | POA: Diagnosis not present

## 2018-07-05 DIAGNOSIS — N2581 Secondary hyperparathyroidism of renal origin: Secondary | ICD-10-CM | POA: Diagnosis not present

## 2018-07-05 DIAGNOSIS — Z992 Dependence on renal dialysis: Secondary | ICD-10-CM | POA: Diagnosis not present

## 2018-07-05 DIAGNOSIS — D631 Anemia in chronic kidney disease: Secondary | ICD-10-CM | POA: Diagnosis not present

## 2018-07-05 DIAGNOSIS — D509 Iron deficiency anemia, unspecified: Secondary | ICD-10-CM | POA: Diagnosis not present

## 2018-07-05 DIAGNOSIS — N186 End stage renal disease: Secondary | ICD-10-CM | POA: Diagnosis not present

## 2018-07-08 DIAGNOSIS — D509 Iron deficiency anemia, unspecified: Secondary | ICD-10-CM | POA: Diagnosis not present

## 2018-07-08 DIAGNOSIS — D631 Anemia in chronic kidney disease: Secondary | ICD-10-CM | POA: Diagnosis not present

## 2018-07-08 DIAGNOSIS — N2581 Secondary hyperparathyroidism of renal origin: Secondary | ICD-10-CM | POA: Diagnosis not present

## 2018-07-08 DIAGNOSIS — N186 End stage renal disease: Secondary | ICD-10-CM | POA: Diagnosis not present

## 2018-07-08 DIAGNOSIS — Z992 Dependence on renal dialysis: Secondary | ICD-10-CM | POA: Diagnosis not present

## 2018-07-13 DIAGNOSIS — Z992 Dependence on renal dialysis: Secondary | ICD-10-CM | POA: Diagnosis not present

## 2018-07-13 DIAGNOSIS — D509 Iron deficiency anemia, unspecified: Secondary | ICD-10-CM | POA: Diagnosis not present

## 2018-07-13 DIAGNOSIS — N186 End stage renal disease: Secondary | ICD-10-CM | POA: Diagnosis not present

## 2018-07-13 DIAGNOSIS — N2581 Secondary hyperparathyroidism of renal origin: Secondary | ICD-10-CM | POA: Diagnosis not present

## 2018-07-13 DIAGNOSIS — D631 Anemia in chronic kidney disease: Secondary | ICD-10-CM | POA: Diagnosis not present

## 2018-07-15 DIAGNOSIS — D631 Anemia in chronic kidney disease: Secondary | ICD-10-CM | POA: Diagnosis not present

## 2018-07-15 DIAGNOSIS — N186 End stage renal disease: Secondary | ICD-10-CM | POA: Diagnosis not present

## 2018-07-15 DIAGNOSIS — D509 Iron deficiency anemia, unspecified: Secondary | ICD-10-CM | POA: Diagnosis not present

## 2018-07-15 DIAGNOSIS — Z992 Dependence on renal dialysis: Secondary | ICD-10-CM | POA: Diagnosis not present

## 2018-07-15 DIAGNOSIS — N2581 Secondary hyperparathyroidism of renal origin: Secondary | ICD-10-CM | POA: Diagnosis not present

## 2018-07-17 DIAGNOSIS — D631 Anemia in chronic kidney disease: Secondary | ICD-10-CM | POA: Diagnosis not present

## 2018-07-17 DIAGNOSIS — N186 End stage renal disease: Secondary | ICD-10-CM | POA: Diagnosis not present

## 2018-07-17 DIAGNOSIS — Z992 Dependence on renal dialysis: Secondary | ICD-10-CM | POA: Diagnosis not present

## 2018-07-17 DIAGNOSIS — N2581 Secondary hyperparathyroidism of renal origin: Secondary | ICD-10-CM | POA: Diagnosis not present

## 2018-07-17 DIAGNOSIS — D509 Iron deficiency anemia, unspecified: Secondary | ICD-10-CM | POA: Diagnosis not present

## 2018-07-19 DIAGNOSIS — N186 End stage renal disease: Secondary | ICD-10-CM | POA: Diagnosis not present

## 2018-07-19 DIAGNOSIS — Z992 Dependence on renal dialysis: Secondary | ICD-10-CM | POA: Diagnosis not present

## 2018-07-19 DIAGNOSIS — N2581 Secondary hyperparathyroidism of renal origin: Secondary | ICD-10-CM | POA: Diagnosis not present

## 2018-07-19 DIAGNOSIS — D509 Iron deficiency anemia, unspecified: Secondary | ICD-10-CM | POA: Diagnosis not present

## 2018-07-19 DIAGNOSIS — D631 Anemia in chronic kidney disease: Secondary | ICD-10-CM | POA: Diagnosis not present

## 2018-07-22 DIAGNOSIS — D509 Iron deficiency anemia, unspecified: Secondary | ICD-10-CM | POA: Diagnosis not present

## 2018-07-22 DIAGNOSIS — N2581 Secondary hyperparathyroidism of renal origin: Secondary | ICD-10-CM | POA: Diagnosis not present

## 2018-07-22 DIAGNOSIS — D631 Anemia in chronic kidney disease: Secondary | ICD-10-CM | POA: Diagnosis not present

## 2018-07-22 DIAGNOSIS — N186 End stage renal disease: Secondary | ICD-10-CM | POA: Diagnosis not present

## 2018-07-22 DIAGNOSIS — Z992 Dependence on renal dialysis: Secondary | ICD-10-CM | POA: Diagnosis not present

## 2018-07-24 DIAGNOSIS — N186 End stage renal disease: Secondary | ICD-10-CM | POA: Diagnosis not present

## 2018-07-24 DIAGNOSIS — N2581 Secondary hyperparathyroidism of renal origin: Secondary | ICD-10-CM | POA: Diagnosis not present

## 2018-07-24 DIAGNOSIS — D631 Anemia in chronic kidney disease: Secondary | ICD-10-CM | POA: Diagnosis not present

## 2018-07-24 DIAGNOSIS — D509 Iron deficiency anemia, unspecified: Secondary | ICD-10-CM | POA: Diagnosis not present

## 2018-07-24 DIAGNOSIS — Z992 Dependence on renal dialysis: Secondary | ICD-10-CM | POA: Diagnosis not present

## 2018-07-26 DIAGNOSIS — D631 Anemia in chronic kidney disease: Secondary | ICD-10-CM | POA: Diagnosis not present

## 2018-07-26 DIAGNOSIS — Z992 Dependence on renal dialysis: Secondary | ICD-10-CM | POA: Diagnosis not present

## 2018-07-26 DIAGNOSIS — N186 End stage renal disease: Secondary | ICD-10-CM | POA: Diagnosis not present

## 2018-07-26 DIAGNOSIS — N2581 Secondary hyperparathyroidism of renal origin: Secondary | ICD-10-CM | POA: Diagnosis not present

## 2018-07-26 DIAGNOSIS — D509 Iron deficiency anemia, unspecified: Secondary | ICD-10-CM | POA: Diagnosis not present

## 2018-07-29 DIAGNOSIS — D631 Anemia in chronic kidney disease: Secondary | ICD-10-CM | POA: Diagnosis not present

## 2018-07-29 DIAGNOSIS — Z992 Dependence on renal dialysis: Secondary | ICD-10-CM | POA: Diagnosis not present

## 2018-07-29 DIAGNOSIS — N186 End stage renal disease: Secondary | ICD-10-CM | POA: Diagnosis not present

## 2018-07-29 DIAGNOSIS — N2581 Secondary hyperparathyroidism of renal origin: Secondary | ICD-10-CM | POA: Diagnosis not present

## 2018-07-29 DIAGNOSIS — D509 Iron deficiency anemia, unspecified: Secondary | ICD-10-CM | POA: Diagnosis not present

## 2018-07-31 DIAGNOSIS — Z992 Dependence on renal dialysis: Secondary | ICD-10-CM | POA: Diagnosis not present

## 2018-07-31 DIAGNOSIS — N2581 Secondary hyperparathyroidism of renal origin: Secondary | ICD-10-CM | POA: Diagnosis not present

## 2018-07-31 DIAGNOSIS — D631 Anemia in chronic kidney disease: Secondary | ICD-10-CM | POA: Diagnosis not present

## 2018-07-31 DIAGNOSIS — N186 End stage renal disease: Secondary | ICD-10-CM | POA: Diagnosis not present

## 2018-07-31 DIAGNOSIS — D509 Iron deficiency anemia, unspecified: Secondary | ICD-10-CM | POA: Diagnosis not present

## 2018-08-02 DIAGNOSIS — N2581 Secondary hyperparathyroidism of renal origin: Secondary | ICD-10-CM | POA: Diagnosis not present

## 2018-08-02 DIAGNOSIS — Z992 Dependence on renal dialysis: Secondary | ICD-10-CM | POA: Diagnosis not present

## 2018-08-02 DIAGNOSIS — D631 Anemia in chronic kidney disease: Secondary | ICD-10-CM | POA: Diagnosis not present

## 2018-08-02 DIAGNOSIS — D509 Iron deficiency anemia, unspecified: Secondary | ICD-10-CM | POA: Diagnosis not present

## 2018-08-02 DIAGNOSIS — N186 End stage renal disease: Secondary | ICD-10-CM | POA: Diagnosis not present

## 2018-08-03 DIAGNOSIS — N186 End stage renal disease: Secondary | ICD-10-CM | POA: Diagnosis not present

## 2018-08-03 DIAGNOSIS — Z992 Dependence on renal dialysis: Secondary | ICD-10-CM | POA: Diagnosis not present

## 2018-08-05 DIAGNOSIS — N186 End stage renal disease: Secondary | ICD-10-CM | POA: Diagnosis not present

## 2018-08-05 DIAGNOSIS — D631 Anemia in chronic kidney disease: Secondary | ICD-10-CM | POA: Diagnosis not present

## 2018-08-05 DIAGNOSIS — N2581 Secondary hyperparathyroidism of renal origin: Secondary | ICD-10-CM | POA: Diagnosis not present

## 2018-08-05 DIAGNOSIS — D509 Iron deficiency anemia, unspecified: Secondary | ICD-10-CM | POA: Diagnosis not present

## 2018-08-05 DIAGNOSIS — Z992 Dependence on renal dialysis: Secondary | ICD-10-CM | POA: Diagnosis not present

## 2018-08-07 DIAGNOSIS — N2581 Secondary hyperparathyroidism of renal origin: Secondary | ICD-10-CM | POA: Diagnosis not present

## 2018-08-07 DIAGNOSIS — N186 End stage renal disease: Secondary | ICD-10-CM | POA: Diagnosis not present

## 2018-08-07 DIAGNOSIS — D509 Iron deficiency anemia, unspecified: Secondary | ICD-10-CM | POA: Diagnosis not present

## 2018-08-07 DIAGNOSIS — D631 Anemia in chronic kidney disease: Secondary | ICD-10-CM | POA: Diagnosis not present

## 2018-08-07 DIAGNOSIS — Z992 Dependence on renal dialysis: Secondary | ICD-10-CM | POA: Diagnosis not present

## 2018-08-09 DIAGNOSIS — Z992 Dependence on renal dialysis: Secondary | ICD-10-CM | POA: Diagnosis not present

## 2018-08-09 DIAGNOSIS — N2581 Secondary hyperparathyroidism of renal origin: Secondary | ICD-10-CM | POA: Diagnosis not present

## 2018-08-09 DIAGNOSIS — D509 Iron deficiency anemia, unspecified: Secondary | ICD-10-CM | POA: Diagnosis not present

## 2018-08-09 DIAGNOSIS — D631 Anemia in chronic kidney disease: Secondary | ICD-10-CM | POA: Diagnosis not present

## 2018-08-09 DIAGNOSIS — N186 End stage renal disease: Secondary | ICD-10-CM | POA: Diagnosis not present

## 2018-08-12 DIAGNOSIS — Z992 Dependence on renal dialysis: Secondary | ICD-10-CM | POA: Diagnosis not present

## 2018-08-12 DIAGNOSIS — N186 End stage renal disease: Secondary | ICD-10-CM | POA: Diagnosis not present

## 2018-08-12 DIAGNOSIS — D509 Iron deficiency anemia, unspecified: Secondary | ICD-10-CM | POA: Diagnosis not present

## 2018-08-12 DIAGNOSIS — N2581 Secondary hyperparathyroidism of renal origin: Secondary | ICD-10-CM | POA: Diagnosis not present

## 2018-08-12 DIAGNOSIS — D631 Anemia in chronic kidney disease: Secondary | ICD-10-CM | POA: Diagnosis not present

## 2018-08-14 DIAGNOSIS — D631 Anemia in chronic kidney disease: Secondary | ICD-10-CM | POA: Diagnosis not present

## 2018-08-14 DIAGNOSIS — N186 End stage renal disease: Secondary | ICD-10-CM | POA: Diagnosis not present

## 2018-08-14 DIAGNOSIS — N2581 Secondary hyperparathyroidism of renal origin: Secondary | ICD-10-CM | POA: Diagnosis not present

## 2018-08-14 DIAGNOSIS — D509 Iron deficiency anemia, unspecified: Secondary | ICD-10-CM | POA: Diagnosis not present

## 2018-08-14 DIAGNOSIS — Z992 Dependence on renal dialysis: Secondary | ICD-10-CM | POA: Diagnosis not present

## 2018-08-16 DIAGNOSIS — N186 End stage renal disease: Secondary | ICD-10-CM | POA: Diagnosis not present

## 2018-08-16 DIAGNOSIS — Z992 Dependence on renal dialysis: Secondary | ICD-10-CM | POA: Diagnosis not present

## 2018-08-16 DIAGNOSIS — D631 Anemia in chronic kidney disease: Secondary | ICD-10-CM | POA: Diagnosis not present

## 2018-08-16 DIAGNOSIS — N2581 Secondary hyperparathyroidism of renal origin: Secondary | ICD-10-CM | POA: Diagnosis not present

## 2018-08-16 DIAGNOSIS — D509 Iron deficiency anemia, unspecified: Secondary | ICD-10-CM | POA: Diagnosis not present

## 2018-08-19 DIAGNOSIS — D631 Anemia in chronic kidney disease: Secondary | ICD-10-CM | POA: Diagnosis not present

## 2018-08-19 DIAGNOSIS — N186 End stage renal disease: Secondary | ICD-10-CM | POA: Diagnosis not present

## 2018-08-19 DIAGNOSIS — N2581 Secondary hyperparathyroidism of renal origin: Secondary | ICD-10-CM | POA: Diagnosis not present

## 2018-08-19 DIAGNOSIS — Z992 Dependence on renal dialysis: Secondary | ICD-10-CM | POA: Diagnosis not present

## 2018-08-19 DIAGNOSIS — D509 Iron deficiency anemia, unspecified: Secondary | ICD-10-CM | POA: Diagnosis not present

## 2018-08-21 DIAGNOSIS — D631 Anemia in chronic kidney disease: Secondary | ICD-10-CM | POA: Diagnosis not present

## 2018-08-21 DIAGNOSIS — Z992 Dependence on renal dialysis: Secondary | ICD-10-CM | POA: Diagnosis not present

## 2018-08-21 DIAGNOSIS — D509 Iron deficiency anemia, unspecified: Secondary | ICD-10-CM | POA: Diagnosis not present

## 2018-08-21 DIAGNOSIS — N186 End stage renal disease: Secondary | ICD-10-CM | POA: Diagnosis not present

## 2018-08-21 DIAGNOSIS — N2581 Secondary hyperparathyroidism of renal origin: Secondary | ICD-10-CM | POA: Diagnosis not present

## 2018-08-26 DIAGNOSIS — E119 Type 2 diabetes mellitus without complications: Secondary | ICD-10-CM | POA: Diagnosis not present

## 2018-08-26 DIAGNOSIS — D631 Anemia in chronic kidney disease: Secondary | ICD-10-CM | POA: Diagnosis not present

## 2018-08-26 DIAGNOSIS — Z992 Dependence on renal dialysis: Secondary | ICD-10-CM | POA: Diagnosis not present

## 2018-08-26 DIAGNOSIS — E785 Hyperlipidemia, unspecified: Secondary | ICD-10-CM | POA: Diagnosis not present

## 2018-08-26 DIAGNOSIS — N2581 Secondary hyperparathyroidism of renal origin: Secondary | ICD-10-CM | POA: Diagnosis not present

## 2018-08-26 DIAGNOSIS — N186 End stage renal disease: Secondary | ICD-10-CM | POA: Diagnosis not present

## 2018-08-26 DIAGNOSIS — D509 Iron deficiency anemia, unspecified: Secondary | ICD-10-CM | POA: Diagnosis not present

## 2018-08-26 DIAGNOSIS — Z794 Long term (current) use of insulin: Secondary | ICD-10-CM | POA: Diagnosis not present

## 2018-08-26 DIAGNOSIS — Z79899 Other long term (current) drug therapy: Secondary | ICD-10-CM | POA: Diagnosis not present

## 2018-08-28 DIAGNOSIS — Z992 Dependence on renal dialysis: Secondary | ICD-10-CM | POA: Diagnosis not present

## 2018-08-28 DIAGNOSIS — D509 Iron deficiency anemia, unspecified: Secondary | ICD-10-CM | POA: Diagnosis not present

## 2018-08-28 DIAGNOSIS — N2581 Secondary hyperparathyroidism of renal origin: Secondary | ICD-10-CM | POA: Diagnosis not present

## 2018-08-28 DIAGNOSIS — N186 End stage renal disease: Secondary | ICD-10-CM | POA: Diagnosis not present

## 2018-08-28 DIAGNOSIS — D631 Anemia in chronic kidney disease: Secondary | ICD-10-CM | POA: Diagnosis not present

## 2018-08-30 DIAGNOSIS — Z992 Dependence on renal dialysis: Secondary | ICD-10-CM | POA: Diagnosis not present

## 2018-08-30 DIAGNOSIS — N186 End stage renal disease: Secondary | ICD-10-CM | POA: Diagnosis not present

## 2018-08-30 DIAGNOSIS — D509 Iron deficiency anemia, unspecified: Secondary | ICD-10-CM | POA: Diagnosis not present

## 2018-08-30 DIAGNOSIS — N2581 Secondary hyperparathyroidism of renal origin: Secondary | ICD-10-CM | POA: Diagnosis not present

## 2018-08-30 DIAGNOSIS — D631 Anemia in chronic kidney disease: Secondary | ICD-10-CM | POA: Diagnosis not present

## 2018-09-02 DIAGNOSIS — Z992 Dependence on renal dialysis: Secondary | ICD-10-CM | POA: Diagnosis not present

## 2018-09-02 DIAGNOSIS — N2581 Secondary hyperparathyroidism of renal origin: Secondary | ICD-10-CM | POA: Diagnosis not present

## 2018-09-02 DIAGNOSIS — N186 End stage renal disease: Secondary | ICD-10-CM | POA: Diagnosis not present

## 2018-09-02 DIAGNOSIS — D509 Iron deficiency anemia, unspecified: Secondary | ICD-10-CM | POA: Diagnosis not present

## 2018-09-02 DIAGNOSIS — D631 Anemia in chronic kidney disease: Secondary | ICD-10-CM | POA: Diagnosis not present

## 2018-09-03 DIAGNOSIS — Z992 Dependence on renal dialysis: Secondary | ICD-10-CM | POA: Diagnosis not present

## 2018-09-03 DIAGNOSIS — N186 End stage renal disease: Secondary | ICD-10-CM | POA: Diagnosis not present

## 2018-09-04 DIAGNOSIS — N186 End stage renal disease: Secondary | ICD-10-CM | POA: Diagnosis not present

## 2018-09-04 DIAGNOSIS — D509 Iron deficiency anemia, unspecified: Secondary | ICD-10-CM | POA: Diagnosis not present

## 2018-09-04 DIAGNOSIS — N2581 Secondary hyperparathyroidism of renal origin: Secondary | ICD-10-CM | POA: Diagnosis not present

## 2018-09-04 DIAGNOSIS — Z992 Dependence on renal dialysis: Secondary | ICD-10-CM | POA: Diagnosis not present

## 2018-09-04 DIAGNOSIS — D631 Anemia in chronic kidney disease: Secondary | ICD-10-CM | POA: Diagnosis not present

## 2018-09-06 DIAGNOSIS — D631 Anemia in chronic kidney disease: Secondary | ICD-10-CM | POA: Diagnosis not present

## 2018-09-06 DIAGNOSIS — D509 Iron deficiency anemia, unspecified: Secondary | ICD-10-CM | POA: Diagnosis not present

## 2018-09-06 DIAGNOSIS — Z992 Dependence on renal dialysis: Secondary | ICD-10-CM | POA: Diagnosis not present

## 2018-09-06 DIAGNOSIS — N186 End stage renal disease: Secondary | ICD-10-CM | POA: Diagnosis not present

## 2018-09-06 DIAGNOSIS — N2581 Secondary hyperparathyroidism of renal origin: Secondary | ICD-10-CM | POA: Diagnosis not present

## 2018-09-09 DIAGNOSIS — Z992 Dependence on renal dialysis: Secondary | ICD-10-CM | POA: Diagnosis not present

## 2018-09-09 DIAGNOSIS — D631 Anemia in chronic kidney disease: Secondary | ICD-10-CM | POA: Diagnosis not present

## 2018-09-09 DIAGNOSIS — N2581 Secondary hyperparathyroidism of renal origin: Secondary | ICD-10-CM | POA: Diagnosis not present

## 2018-09-09 DIAGNOSIS — D509 Iron deficiency anemia, unspecified: Secondary | ICD-10-CM | POA: Diagnosis not present

## 2018-09-09 DIAGNOSIS — N186 End stage renal disease: Secondary | ICD-10-CM | POA: Diagnosis not present

## 2018-09-11 DIAGNOSIS — D631 Anemia in chronic kidney disease: Secondary | ICD-10-CM | POA: Diagnosis not present

## 2018-09-11 DIAGNOSIS — Z992 Dependence on renal dialysis: Secondary | ICD-10-CM | POA: Diagnosis not present

## 2018-09-11 DIAGNOSIS — N2581 Secondary hyperparathyroidism of renal origin: Secondary | ICD-10-CM | POA: Diagnosis not present

## 2018-09-11 DIAGNOSIS — D509 Iron deficiency anemia, unspecified: Secondary | ICD-10-CM | POA: Diagnosis not present

## 2018-09-11 DIAGNOSIS — N186 End stage renal disease: Secondary | ICD-10-CM | POA: Diagnosis not present

## 2018-09-13 DIAGNOSIS — D631 Anemia in chronic kidney disease: Secondary | ICD-10-CM | POA: Diagnosis not present

## 2018-09-13 DIAGNOSIS — D509 Iron deficiency anemia, unspecified: Secondary | ICD-10-CM | POA: Diagnosis not present

## 2018-09-13 DIAGNOSIS — N186 End stage renal disease: Secondary | ICD-10-CM | POA: Diagnosis not present

## 2018-09-13 DIAGNOSIS — Z992 Dependence on renal dialysis: Secondary | ICD-10-CM | POA: Diagnosis not present

## 2018-09-13 DIAGNOSIS — N2581 Secondary hyperparathyroidism of renal origin: Secondary | ICD-10-CM | POA: Diagnosis not present

## 2018-09-18 DIAGNOSIS — D631 Anemia in chronic kidney disease: Secondary | ICD-10-CM | POA: Diagnosis not present

## 2018-09-18 DIAGNOSIS — Z992 Dependence on renal dialysis: Secondary | ICD-10-CM | POA: Diagnosis not present

## 2018-09-18 DIAGNOSIS — D509 Iron deficiency anemia, unspecified: Secondary | ICD-10-CM | POA: Diagnosis not present

## 2018-09-18 DIAGNOSIS — N2581 Secondary hyperparathyroidism of renal origin: Secondary | ICD-10-CM | POA: Diagnosis not present

## 2018-09-18 DIAGNOSIS — N186 End stage renal disease: Secondary | ICD-10-CM | POA: Diagnosis not present

## 2018-09-20 DIAGNOSIS — N186 End stage renal disease: Secondary | ICD-10-CM | POA: Diagnosis not present

## 2018-09-20 DIAGNOSIS — D631 Anemia in chronic kidney disease: Secondary | ICD-10-CM | POA: Diagnosis not present

## 2018-09-20 DIAGNOSIS — N2581 Secondary hyperparathyroidism of renal origin: Secondary | ICD-10-CM | POA: Diagnosis not present

## 2018-09-20 DIAGNOSIS — Z992 Dependence on renal dialysis: Secondary | ICD-10-CM | POA: Diagnosis not present

## 2018-09-20 DIAGNOSIS — D509 Iron deficiency anemia, unspecified: Secondary | ICD-10-CM | POA: Diagnosis not present

## 2018-09-23 DIAGNOSIS — N186 End stage renal disease: Secondary | ICD-10-CM | POA: Diagnosis not present

## 2018-09-23 DIAGNOSIS — Z992 Dependence on renal dialysis: Secondary | ICD-10-CM | POA: Diagnosis not present

## 2018-09-23 DIAGNOSIS — D631 Anemia in chronic kidney disease: Secondary | ICD-10-CM | POA: Diagnosis not present

## 2018-09-23 DIAGNOSIS — N2581 Secondary hyperparathyroidism of renal origin: Secondary | ICD-10-CM | POA: Diagnosis not present

## 2018-09-23 DIAGNOSIS — D509 Iron deficiency anemia, unspecified: Secondary | ICD-10-CM | POA: Diagnosis not present

## 2018-09-25 DIAGNOSIS — Z992 Dependence on renal dialysis: Secondary | ICD-10-CM | POA: Diagnosis not present

## 2018-09-25 DIAGNOSIS — N186 End stage renal disease: Secondary | ICD-10-CM | POA: Diagnosis not present

## 2018-09-25 DIAGNOSIS — N2581 Secondary hyperparathyroidism of renal origin: Secondary | ICD-10-CM | POA: Diagnosis not present

## 2018-09-25 DIAGNOSIS — D631 Anemia in chronic kidney disease: Secondary | ICD-10-CM | POA: Diagnosis not present

## 2018-09-25 DIAGNOSIS — D509 Iron deficiency anemia, unspecified: Secondary | ICD-10-CM | POA: Diagnosis not present

## 2018-09-27 DIAGNOSIS — D509 Iron deficiency anemia, unspecified: Secondary | ICD-10-CM | POA: Diagnosis not present

## 2018-09-27 DIAGNOSIS — Z992 Dependence on renal dialysis: Secondary | ICD-10-CM | POA: Diagnosis not present

## 2018-09-27 DIAGNOSIS — N186 End stage renal disease: Secondary | ICD-10-CM | POA: Diagnosis not present

## 2018-09-27 DIAGNOSIS — D631 Anemia in chronic kidney disease: Secondary | ICD-10-CM | POA: Diagnosis not present

## 2018-09-27 DIAGNOSIS — N2581 Secondary hyperparathyroidism of renal origin: Secondary | ICD-10-CM | POA: Diagnosis not present

## 2018-09-30 DIAGNOSIS — D509 Iron deficiency anemia, unspecified: Secondary | ICD-10-CM | POA: Diagnosis not present

## 2018-09-30 DIAGNOSIS — D631 Anemia in chronic kidney disease: Secondary | ICD-10-CM | POA: Diagnosis not present

## 2018-09-30 DIAGNOSIS — N2581 Secondary hyperparathyroidism of renal origin: Secondary | ICD-10-CM | POA: Diagnosis not present

## 2018-09-30 DIAGNOSIS — N186 End stage renal disease: Secondary | ICD-10-CM | POA: Diagnosis not present

## 2018-09-30 DIAGNOSIS — Z992 Dependence on renal dialysis: Secondary | ICD-10-CM | POA: Diagnosis not present

## 2018-10-02 DIAGNOSIS — Z992 Dependence on renal dialysis: Secondary | ICD-10-CM | POA: Diagnosis not present

## 2018-10-02 DIAGNOSIS — N2581 Secondary hyperparathyroidism of renal origin: Secondary | ICD-10-CM | POA: Diagnosis not present

## 2018-10-02 DIAGNOSIS — D509 Iron deficiency anemia, unspecified: Secondary | ICD-10-CM | POA: Diagnosis not present

## 2018-10-02 DIAGNOSIS — D631 Anemia in chronic kidney disease: Secondary | ICD-10-CM | POA: Diagnosis not present

## 2018-10-02 DIAGNOSIS — N186 End stage renal disease: Secondary | ICD-10-CM | POA: Diagnosis not present

## 2018-10-03 DIAGNOSIS — N186 End stage renal disease: Secondary | ICD-10-CM | POA: Diagnosis not present

## 2018-10-03 DIAGNOSIS — Z992 Dependence on renal dialysis: Secondary | ICD-10-CM | POA: Diagnosis not present

## 2018-10-04 DIAGNOSIS — N2581 Secondary hyperparathyroidism of renal origin: Secondary | ICD-10-CM | POA: Diagnosis not present

## 2018-10-04 DIAGNOSIS — N186 End stage renal disease: Secondary | ICD-10-CM | POA: Diagnosis not present

## 2018-10-04 DIAGNOSIS — Z992 Dependence on renal dialysis: Secondary | ICD-10-CM | POA: Diagnosis not present

## 2018-10-04 DIAGNOSIS — D631 Anemia in chronic kidney disease: Secondary | ICD-10-CM | POA: Diagnosis not present

## 2018-10-04 DIAGNOSIS — D509 Iron deficiency anemia, unspecified: Secondary | ICD-10-CM | POA: Diagnosis not present

## 2018-10-09 DIAGNOSIS — N186 End stage renal disease: Secondary | ICD-10-CM | POA: Diagnosis not present

## 2018-10-09 DIAGNOSIS — D631 Anemia in chronic kidney disease: Secondary | ICD-10-CM | POA: Diagnosis not present

## 2018-10-09 DIAGNOSIS — D509 Iron deficiency anemia, unspecified: Secondary | ICD-10-CM | POA: Diagnosis not present

## 2018-10-09 DIAGNOSIS — Z992 Dependence on renal dialysis: Secondary | ICD-10-CM | POA: Diagnosis not present

## 2018-10-09 DIAGNOSIS — N2581 Secondary hyperparathyroidism of renal origin: Secondary | ICD-10-CM | POA: Diagnosis not present

## 2018-10-11 DIAGNOSIS — D509 Iron deficiency anemia, unspecified: Secondary | ICD-10-CM | POA: Diagnosis not present

## 2018-10-11 DIAGNOSIS — Z992 Dependence on renal dialysis: Secondary | ICD-10-CM | POA: Diagnosis not present

## 2018-10-11 DIAGNOSIS — N2581 Secondary hyperparathyroidism of renal origin: Secondary | ICD-10-CM | POA: Diagnosis not present

## 2018-10-11 DIAGNOSIS — N186 End stage renal disease: Secondary | ICD-10-CM | POA: Diagnosis not present

## 2018-10-11 DIAGNOSIS — D631 Anemia in chronic kidney disease: Secondary | ICD-10-CM | POA: Diagnosis not present

## 2018-10-14 DIAGNOSIS — Z992 Dependence on renal dialysis: Secondary | ICD-10-CM | POA: Diagnosis not present

## 2018-10-14 DIAGNOSIS — D509 Iron deficiency anemia, unspecified: Secondary | ICD-10-CM | POA: Diagnosis not present

## 2018-10-14 DIAGNOSIS — N2581 Secondary hyperparathyroidism of renal origin: Secondary | ICD-10-CM | POA: Diagnosis not present

## 2018-10-14 DIAGNOSIS — N186 End stage renal disease: Secondary | ICD-10-CM | POA: Diagnosis not present

## 2018-10-14 DIAGNOSIS — D631 Anemia in chronic kidney disease: Secondary | ICD-10-CM | POA: Diagnosis not present

## 2018-10-16 DIAGNOSIS — Z992 Dependence on renal dialysis: Secondary | ICD-10-CM | POA: Diagnosis not present

## 2018-10-16 DIAGNOSIS — N186 End stage renal disease: Secondary | ICD-10-CM | POA: Diagnosis not present

## 2018-10-16 DIAGNOSIS — D509 Iron deficiency anemia, unspecified: Secondary | ICD-10-CM | POA: Diagnosis not present

## 2018-10-16 DIAGNOSIS — D631 Anemia in chronic kidney disease: Secondary | ICD-10-CM | POA: Diagnosis not present

## 2018-10-16 DIAGNOSIS — N2581 Secondary hyperparathyroidism of renal origin: Secondary | ICD-10-CM | POA: Diagnosis not present

## 2018-10-18 DIAGNOSIS — N186 End stage renal disease: Secondary | ICD-10-CM | POA: Diagnosis not present

## 2018-10-18 DIAGNOSIS — D631 Anemia in chronic kidney disease: Secondary | ICD-10-CM | POA: Diagnosis not present

## 2018-10-18 DIAGNOSIS — Z992 Dependence on renal dialysis: Secondary | ICD-10-CM | POA: Diagnosis not present

## 2018-10-18 DIAGNOSIS — N2581 Secondary hyperparathyroidism of renal origin: Secondary | ICD-10-CM | POA: Diagnosis not present

## 2018-10-18 DIAGNOSIS — D509 Iron deficiency anemia, unspecified: Secondary | ICD-10-CM | POA: Diagnosis not present

## 2018-10-21 DIAGNOSIS — Z992 Dependence on renal dialysis: Secondary | ICD-10-CM | POA: Diagnosis not present

## 2018-10-21 DIAGNOSIS — N2581 Secondary hyperparathyroidism of renal origin: Secondary | ICD-10-CM | POA: Diagnosis not present

## 2018-10-21 DIAGNOSIS — D631 Anemia in chronic kidney disease: Secondary | ICD-10-CM | POA: Diagnosis not present

## 2018-10-21 DIAGNOSIS — D509 Iron deficiency anemia, unspecified: Secondary | ICD-10-CM | POA: Diagnosis not present

## 2018-10-21 DIAGNOSIS — N186 End stage renal disease: Secondary | ICD-10-CM | POA: Diagnosis not present

## 2018-10-23 DIAGNOSIS — D631 Anemia in chronic kidney disease: Secondary | ICD-10-CM | POA: Diagnosis not present

## 2018-10-23 DIAGNOSIS — N186 End stage renal disease: Secondary | ICD-10-CM | POA: Diagnosis not present

## 2018-10-23 DIAGNOSIS — D509 Iron deficiency anemia, unspecified: Secondary | ICD-10-CM | POA: Diagnosis not present

## 2018-10-23 DIAGNOSIS — Z992 Dependence on renal dialysis: Secondary | ICD-10-CM | POA: Diagnosis not present

## 2018-10-23 DIAGNOSIS — N2581 Secondary hyperparathyroidism of renal origin: Secondary | ICD-10-CM | POA: Diagnosis not present

## 2018-10-25 DIAGNOSIS — D509 Iron deficiency anemia, unspecified: Secondary | ICD-10-CM | POA: Diagnosis not present

## 2018-10-25 DIAGNOSIS — N2581 Secondary hyperparathyroidism of renal origin: Secondary | ICD-10-CM | POA: Diagnosis not present

## 2018-10-25 DIAGNOSIS — D631 Anemia in chronic kidney disease: Secondary | ICD-10-CM | POA: Diagnosis not present

## 2018-10-25 DIAGNOSIS — N186 End stage renal disease: Secondary | ICD-10-CM | POA: Diagnosis not present

## 2018-10-25 DIAGNOSIS — Z992 Dependence on renal dialysis: Secondary | ICD-10-CM | POA: Diagnosis not present

## 2018-10-28 DIAGNOSIS — Z992 Dependence on renal dialysis: Secondary | ICD-10-CM | POA: Diagnosis not present

## 2018-10-28 DIAGNOSIS — N2581 Secondary hyperparathyroidism of renal origin: Secondary | ICD-10-CM | POA: Diagnosis not present

## 2018-10-28 DIAGNOSIS — D631 Anemia in chronic kidney disease: Secondary | ICD-10-CM | POA: Diagnosis not present

## 2018-10-28 DIAGNOSIS — N186 End stage renal disease: Secondary | ICD-10-CM | POA: Diagnosis not present

## 2018-10-28 DIAGNOSIS — D509 Iron deficiency anemia, unspecified: Secondary | ICD-10-CM | POA: Diagnosis not present

## 2018-10-30 DIAGNOSIS — Z992 Dependence on renal dialysis: Secondary | ICD-10-CM | POA: Diagnosis not present

## 2018-10-30 DIAGNOSIS — D631 Anemia in chronic kidney disease: Secondary | ICD-10-CM | POA: Diagnosis not present

## 2018-10-30 DIAGNOSIS — D509 Iron deficiency anemia, unspecified: Secondary | ICD-10-CM | POA: Diagnosis not present

## 2018-10-30 DIAGNOSIS — N2581 Secondary hyperparathyroidism of renal origin: Secondary | ICD-10-CM | POA: Diagnosis not present

## 2018-10-30 DIAGNOSIS — N186 End stage renal disease: Secondary | ICD-10-CM | POA: Diagnosis not present

## 2018-11-01 DIAGNOSIS — N2581 Secondary hyperparathyroidism of renal origin: Secondary | ICD-10-CM | POA: Diagnosis not present

## 2018-11-01 DIAGNOSIS — D509 Iron deficiency anemia, unspecified: Secondary | ICD-10-CM | POA: Diagnosis not present

## 2018-11-01 DIAGNOSIS — N186 End stage renal disease: Secondary | ICD-10-CM | POA: Diagnosis not present

## 2018-11-01 DIAGNOSIS — Z992 Dependence on renal dialysis: Secondary | ICD-10-CM | POA: Diagnosis not present

## 2018-11-01 DIAGNOSIS — D631 Anemia in chronic kidney disease: Secondary | ICD-10-CM | POA: Diagnosis not present

## 2018-11-03 DIAGNOSIS — N186 End stage renal disease: Secondary | ICD-10-CM | POA: Diagnosis not present

## 2018-11-03 DIAGNOSIS — Z992 Dependence on renal dialysis: Secondary | ICD-10-CM | POA: Diagnosis not present

## 2018-11-05 DIAGNOSIS — Z992 Dependence on renal dialysis: Secondary | ICD-10-CM | POA: Diagnosis not present

## 2018-11-05 DIAGNOSIS — D631 Anemia in chronic kidney disease: Secondary | ICD-10-CM | POA: Diagnosis not present

## 2018-11-05 DIAGNOSIS — D509 Iron deficiency anemia, unspecified: Secondary | ICD-10-CM | POA: Diagnosis not present

## 2018-11-05 DIAGNOSIS — N186 End stage renal disease: Secondary | ICD-10-CM | POA: Diagnosis not present

## 2018-11-05 DIAGNOSIS — N2581 Secondary hyperparathyroidism of renal origin: Secondary | ICD-10-CM | POA: Diagnosis not present

## 2018-11-08 DIAGNOSIS — N186 End stage renal disease: Secondary | ICD-10-CM | POA: Diagnosis not present

## 2018-11-08 DIAGNOSIS — D509 Iron deficiency anemia, unspecified: Secondary | ICD-10-CM | POA: Diagnosis not present

## 2018-11-08 DIAGNOSIS — Z992 Dependence on renal dialysis: Secondary | ICD-10-CM | POA: Diagnosis not present

## 2018-11-08 DIAGNOSIS — D631 Anemia in chronic kidney disease: Secondary | ICD-10-CM | POA: Diagnosis not present

## 2018-11-08 DIAGNOSIS — N2581 Secondary hyperparathyroidism of renal origin: Secondary | ICD-10-CM | POA: Diagnosis not present

## 2018-11-11 DIAGNOSIS — D631 Anemia in chronic kidney disease: Secondary | ICD-10-CM | POA: Diagnosis not present

## 2018-11-11 DIAGNOSIS — N186 End stage renal disease: Secondary | ICD-10-CM | POA: Diagnosis not present

## 2018-11-11 DIAGNOSIS — N2581 Secondary hyperparathyroidism of renal origin: Secondary | ICD-10-CM | POA: Diagnosis not present

## 2018-11-11 DIAGNOSIS — D509 Iron deficiency anemia, unspecified: Secondary | ICD-10-CM | POA: Diagnosis not present

## 2018-11-11 DIAGNOSIS — Z992 Dependence on renal dialysis: Secondary | ICD-10-CM | POA: Diagnosis not present

## 2018-11-13 DIAGNOSIS — D509 Iron deficiency anemia, unspecified: Secondary | ICD-10-CM | POA: Diagnosis not present

## 2018-11-13 DIAGNOSIS — Z992 Dependence on renal dialysis: Secondary | ICD-10-CM | POA: Diagnosis not present

## 2018-11-13 DIAGNOSIS — D631 Anemia in chronic kidney disease: Secondary | ICD-10-CM | POA: Diagnosis not present

## 2018-11-13 DIAGNOSIS — N186 End stage renal disease: Secondary | ICD-10-CM | POA: Diagnosis not present

## 2018-11-13 DIAGNOSIS — N2581 Secondary hyperparathyroidism of renal origin: Secondary | ICD-10-CM | POA: Diagnosis not present

## 2018-11-15 DIAGNOSIS — D631 Anemia in chronic kidney disease: Secondary | ICD-10-CM | POA: Diagnosis not present

## 2018-11-15 DIAGNOSIS — N2581 Secondary hyperparathyroidism of renal origin: Secondary | ICD-10-CM | POA: Diagnosis not present

## 2018-11-15 DIAGNOSIS — D509 Iron deficiency anemia, unspecified: Secondary | ICD-10-CM | POA: Diagnosis not present

## 2018-11-15 DIAGNOSIS — Z992 Dependence on renal dialysis: Secondary | ICD-10-CM | POA: Diagnosis not present

## 2018-11-15 DIAGNOSIS — N186 End stage renal disease: Secondary | ICD-10-CM | POA: Diagnosis not present

## 2018-11-18 DIAGNOSIS — D509 Iron deficiency anemia, unspecified: Secondary | ICD-10-CM | POA: Diagnosis not present

## 2018-11-18 DIAGNOSIS — D631 Anemia in chronic kidney disease: Secondary | ICD-10-CM | POA: Diagnosis not present

## 2018-11-18 DIAGNOSIS — Z992 Dependence on renal dialysis: Secondary | ICD-10-CM | POA: Diagnosis not present

## 2018-11-18 DIAGNOSIS — N186 End stage renal disease: Secondary | ICD-10-CM | POA: Diagnosis not present

## 2018-11-18 DIAGNOSIS — N2581 Secondary hyperparathyroidism of renal origin: Secondary | ICD-10-CM | POA: Diagnosis not present

## 2018-11-20 DIAGNOSIS — D509 Iron deficiency anemia, unspecified: Secondary | ICD-10-CM | POA: Diagnosis not present

## 2018-11-20 DIAGNOSIS — Z992 Dependence on renal dialysis: Secondary | ICD-10-CM | POA: Diagnosis not present

## 2018-11-20 DIAGNOSIS — D631 Anemia in chronic kidney disease: Secondary | ICD-10-CM | POA: Diagnosis not present

## 2018-11-20 DIAGNOSIS — N2581 Secondary hyperparathyroidism of renal origin: Secondary | ICD-10-CM | POA: Diagnosis not present

## 2018-11-20 DIAGNOSIS — N186 End stage renal disease: Secondary | ICD-10-CM | POA: Diagnosis not present

## 2018-11-21 DIAGNOSIS — Z7189 Other specified counseling: Secondary | ICD-10-CM | POA: Diagnosis not present

## 2018-11-21 DIAGNOSIS — I10 Essential (primary) hypertension: Secondary | ICD-10-CM | POA: Diagnosis not present

## 2018-11-21 DIAGNOSIS — Z299 Encounter for prophylactic measures, unspecified: Secondary | ICD-10-CM | POA: Diagnosis not present

## 2018-11-21 DIAGNOSIS — E7800 Pure hypercholesterolemia, unspecified: Secondary | ICD-10-CM | POA: Diagnosis not present

## 2018-11-21 DIAGNOSIS — E1122 Type 2 diabetes mellitus with diabetic chronic kidney disease: Secondary | ICD-10-CM | POA: Diagnosis not present

## 2018-11-21 DIAGNOSIS — Z6826 Body mass index (BMI) 26.0-26.9, adult: Secondary | ICD-10-CM | POA: Diagnosis not present

## 2018-11-21 DIAGNOSIS — R5383 Other fatigue: Secondary | ICD-10-CM | POA: Diagnosis not present

## 2018-11-21 DIAGNOSIS — Z1339 Encounter for screening examination for other mental health and behavioral disorders: Secondary | ICD-10-CM | POA: Diagnosis not present

## 2018-11-21 DIAGNOSIS — Z1331 Encounter for screening for depression: Secondary | ICD-10-CM | POA: Diagnosis not present

## 2018-11-21 DIAGNOSIS — Z0000 Encounter for general adult medical examination without abnormal findings: Secondary | ICD-10-CM | POA: Diagnosis not present

## 2018-11-22 DIAGNOSIS — D509 Iron deficiency anemia, unspecified: Secondary | ICD-10-CM | POA: Diagnosis not present

## 2018-11-22 DIAGNOSIS — N2581 Secondary hyperparathyroidism of renal origin: Secondary | ICD-10-CM | POA: Diagnosis not present

## 2018-11-22 DIAGNOSIS — Z992 Dependence on renal dialysis: Secondary | ICD-10-CM | POA: Diagnosis not present

## 2018-11-22 DIAGNOSIS — N186 End stage renal disease: Secondary | ICD-10-CM | POA: Diagnosis not present

## 2018-11-22 DIAGNOSIS — D631 Anemia in chronic kidney disease: Secondary | ICD-10-CM | POA: Diagnosis not present

## 2018-11-25 DIAGNOSIS — E119 Type 2 diabetes mellitus without complications: Secondary | ICD-10-CM | POA: Diagnosis not present

## 2018-11-25 DIAGNOSIS — D509 Iron deficiency anemia, unspecified: Secondary | ICD-10-CM | POA: Diagnosis not present

## 2018-11-25 DIAGNOSIS — N2581 Secondary hyperparathyroidism of renal origin: Secondary | ICD-10-CM | POA: Diagnosis not present

## 2018-11-25 DIAGNOSIS — D631 Anemia in chronic kidney disease: Secondary | ICD-10-CM | POA: Diagnosis not present

## 2018-11-25 DIAGNOSIS — N186 End stage renal disease: Secondary | ICD-10-CM | POA: Diagnosis not present

## 2018-11-25 DIAGNOSIS — Z992 Dependence on renal dialysis: Secondary | ICD-10-CM | POA: Diagnosis not present

## 2018-11-25 DIAGNOSIS — Z794 Long term (current) use of insulin: Secondary | ICD-10-CM | POA: Diagnosis not present

## 2018-11-27 DIAGNOSIS — Z992 Dependence on renal dialysis: Secondary | ICD-10-CM | POA: Diagnosis not present

## 2018-11-27 DIAGNOSIS — N2581 Secondary hyperparathyroidism of renal origin: Secondary | ICD-10-CM | POA: Diagnosis not present

## 2018-11-27 DIAGNOSIS — D631 Anemia in chronic kidney disease: Secondary | ICD-10-CM | POA: Diagnosis not present

## 2018-11-27 DIAGNOSIS — D509 Iron deficiency anemia, unspecified: Secondary | ICD-10-CM | POA: Diagnosis not present

## 2018-11-27 DIAGNOSIS — N186 End stage renal disease: Secondary | ICD-10-CM | POA: Diagnosis not present

## 2018-11-29 DIAGNOSIS — N2581 Secondary hyperparathyroidism of renal origin: Secondary | ICD-10-CM | POA: Diagnosis not present

## 2018-11-29 DIAGNOSIS — D509 Iron deficiency anemia, unspecified: Secondary | ICD-10-CM | POA: Diagnosis not present

## 2018-11-29 DIAGNOSIS — Z992 Dependence on renal dialysis: Secondary | ICD-10-CM | POA: Diagnosis not present

## 2018-11-29 DIAGNOSIS — D631 Anemia in chronic kidney disease: Secondary | ICD-10-CM | POA: Diagnosis not present

## 2018-11-29 DIAGNOSIS — N186 End stage renal disease: Secondary | ICD-10-CM | POA: Diagnosis not present

## 2018-12-02 DIAGNOSIS — N2581 Secondary hyperparathyroidism of renal origin: Secondary | ICD-10-CM | POA: Diagnosis not present

## 2018-12-02 DIAGNOSIS — D509 Iron deficiency anemia, unspecified: Secondary | ICD-10-CM | POA: Diagnosis not present

## 2018-12-02 DIAGNOSIS — D631 Anemia in chronic kidney disease: Secondary | ICD-10-CM | POA: Diagnosis not present

## 2018-12-02 DIAGNOSIS — N186 End stage renal disease: Secondary | ICD-10-CM | POA: Diagnosis not present

## 2018-12-02 DIAGNOSIS — Z992 Dependence on renal dialysis: Secondary | ICD-10-CM | POA: Diagnosis not present

## 2018-12-03 DIAGNOSIS — N186 End stage renal disease: Secondary | ICD-10-CM | POA: Diagnosis not present

## 2018-12-03 DIAGNOSIS — Z992 Dependence on renal dialysis: Secondary | ICD-10-CM | POA: Diagnosis not present

## 2018-12-06 DIAGNOSIS — D509 Iron deficiency anemia, unspecified: Secondary | ICD-10-CM | POA: Diagnosis not present

## 2018-12-06 DIAGNOSIS — N2581 Secondary hyperparathyroidism of renal origin: Secondary | ICD-10-CM | POA: Diagnosis not present

## 2018-12-06 DIAGNOSIS — D631 Anemia in chronic kidney disease: Secondary | ICD-10-CM | POA: Diagnosis not present

## 2018-12-06 DIAGNOSIS — Z992 Dependence on renal dialysis: Secondary | ICD-10-CM | POA: Diagnosis not present

## 2018-12-06 DIAGNOSIS — N186 End stage renal disease: Secondary | ICD-10-CM | POA: Diagnosis not present

## 2018-12-09 DIAGNOSIS — D631 Anemia in chronic kidney disease: Secondary | ICD-10-CM | POA: Diagnosis not present

## 2018-12-09 DIAGNOSIS — N186 End stage renal disease: Secondary | ICD-10-CM | POA: Diagnosis not present

## 2018-12-09 DIAGNOSIS — D509 Iron deficiency anemia, unspecified: Secondary | ICD-10-CM | POA: Diagnosis not present

## 2018-12-09 DIAGNOSIS — Z992 Dependence on renal dialysis: Secondary | ICD-10-CM | POA: Diagnosis not present

## 2018-12-09 DIAGNOSIS — N2581 Secondary hyperparathyroidism of renal origin: Secondary | ICD-10-CM | POA: Diagnosis not present

## 2018-12-11 DIAGNOSIS — N186 End stage renal disease: Secondary | ICD-10-CM | POA: Diagnosis not present

## 2018-12-11 DIAGNOSIS — N2581 Secondary hyperparathyroidism of renal origin: Secondary | ICD-10-CM | POA: Diagnosis not present

## 2018-12-11 DIAGNOSIS — D509 Iron deficiency anemia, unspecified: Secondary | ICD-10-CM | POA: Diagnosis not present

## 2018-12-11 DIAGNOSIS — D631 Anemia in chronic kidney disease: Secondary | ICD-10-CM | POA: Diagnosis not present

## 2018-12-11 DIAGNOSIS — Z992 Dependence on renal dialysis: Secondary | ICD-10-CM | POA: Diagnosis not present

## 2018-12-13 DIAGNOSIS — N186 End stage renal disease: Secondary | ICD-10-CM | POA: Diagnosis not present

## 2018-12-13 DIAGNOSIS — D509 Iron deficiency anemia, unspecified: Secondary | ICD-10-CM | POA: Diagnosis not present

## 2018-12-13 DIAGNOSIS — N2581 Secondary hyperparathyroidism of renal origin: Secondary | ICD-10-CM | POA: Diagnosis not present

## 2018-12-13 DIAGNOSIS — Z992 Dependence on renal dialysis: Secondary | ICD-10-CM | POA: Diagnosis not present

## 2018-12-13 DIAGNOSIS — D631 Anemia in chronic kidney disease: Secondary | ICD-10-CM | POA: Diagnosis not present

## 2018-12-16 DIAGNOSIS — Z992 Dependence on renal dialysis: Secondary | ICD-10-CM | POA: Diagnosis not present

## 2018-12-16 DIAGNOSIS — N2581 Secondary hyperparathyroidism of renal origin: Secondary | ICD-10-CM | POA: Diagnosis not present

## 2018-12-16 DIAGNOSIS — D631 Anemia in chronic kidney disease: Secondary | ICD-10-CM | POA: Diagnosis not present

## 2018-12-16 DIAGNOSIS — N186 End stage renal disease: Secondary | ICD-10-CM | POA: Diagnosis not present

## 2018-12-16 DIAGNOSIS — D509 Iron deficiency anemia, unspecified: Secondary | ICD-10-CM | POA: Diagnosis not present

## 2018-12-18 DIAGNOSIS — N186 End stage renal disease: Secondary | ICD-10-CM | POA: Diagnosis not present

## 2018-12-18 DIAGNOSIS — D509 Iron deficiency anemia, unspecified: Secondary | ICD-10-CM | POA: Diagnosis not present

## 2018-12-18 DIAGNOSIS — D631 Anemia in chronic kidney disease: Secondary | ICD-10-CM | POA: Diagnosis not present

## 2018-12-18 DIAGNOSIS — Z992 Dependence on renal dialysis: Secondary | ICD-10-CM | POA: Diagnosis not present

## 2018-12-18 DIAGNOSIS — N2581 Secondary hyperparathyroidism of renal origin: Secondary | ICD-10-CM | POA: Diagnosis not present

## 2018-12-20 DIAGNOSIS — N2581 Secondary hyperparathyroidism of renal origin: Secondary | ICD-10-CM | POA: Diagnosis not present

## 2018-12-20 DIAGNOSIS — D509 Iron deficiency anemia, unspecified: Secondary | ICD-10-CM | POA: Diagnosis not present

## 2018-12-20 DIAGNOSIS — N186 End stage renal disease: Secondary | ICD-10-CM | POA: Diagnosis not present

## 2018-12-20 DIAGNOSIS — Z992 Dependence on renal dialysis: Secondary | ICD-10-CM | POA: Diagnosis not present

## 2018-12-20 DIAGNOSIS — D631 Anemia in chronic kidney disease: Secondary | ICD-10-CM | POA: Diagnosis not present

## 2018-12-23 DIAGNOSIS — Z992 Dependence on renal dialysis: Secondary | ICD-10-CM | POA: Diagnosis not present

## 2018-12-23 DIAGNOSIS — D509 Iron deficiency anemia, unspecified: Secondary | ICD-10-CM | POA: Diagnosis not present

## 2018-12-23 DIAGNOSIS — N186 End stage renal disease: Secondary | ICD-10-CM | POA: Diagnosis not present

## 2018-12-23 DIAGNOSIS — N2581 Secondary hyperparathyroidism of renal origin: Secondary | ICD-10-CM | POA: Diagnosis not present

## 2018-12-23 DIAGNOSIS — D631 Anemia in chronic kidney disease: Secondary | ICD-10-CM | POA: Diagnosis not present

## 2018-12-25 DIAGNOSIS — N2581 Secondary hyperparathyroidism of renal origin: Secondary | ICD-10-CM | POA: Diagnosis not present

## 2018-12-25 DIAGNOSIS — D509 Iron deficiency anemia, unspecified: Secondary | ICD-10-CM | POA: Diagnosis not present

## 2018-12-25 DIAGNOSIS — Z992 Dependence on renal dialysis: Secondary | ICD-10-CM | POA: Diagnosis not present

## 2018-12-25 DIAGNOSIS — N186 End stage renal disease: Secondary | ICD-10-CM | POA: Diagnosis not present

## 2018-12-25 DIAGNOSIS — D631 Anemia in chronic kidney disease: Secondary | ICD-10-CM | POA: Diagnosis not present

## 2018-12-27 DIAGNOSIS — N2581 Secondary hyperparathyroidism of renal origin: Secondary | ICD-10-CM | POA: Diagnosis not present

## 2018-12-27 DIAGNOSIS — D509 Iron deficiency anemia, unspecified: Secondary | ICD-10-CM | POA: Diagnosis not present

## 2018-12-27 DIAGNOSIS — Z992 Dependence on renal dialysis: Secondary | ICD-10-CM | POA: Diagnosis not present

## 2018-12-27 DIAGNOSIS — D631 Anemia in chronic kidney disease: Secondary | ICD-10-CM | POA: Diagnosis not present

## 2018-12-27 DIAGNOSIS — N186 End stage renal disease: Secondary | ICD-10-CM | POA: Diagnosis not present

## 2018-12-30 DIAGNOSIS — D509 Iron deficiency anemia, unspecified: Secondary | ICD-10-CM | POA: Diagnosis not present

## 2018-12-30 DIAGNOSIS — D631 Anemia in chronic kidney disease: Secondary | ICD-10-CM | POA: Diagnosis not present

## 2018-12-30 DIAGNOSIS — Z992 Dependence on renal dialysis: Secondary | ICD-10-CM | POA: Diagnosis not present

## 2018-12-30 DIAGNOSIS — N2581 Secondary hyperparathyroidism of renal origin: Secondary | ICD-10-CM | POA: Diagnosis not present

## 2018-12-30 DIAGNOSIS — N186 End stage renal disease: Secondary | ICD-10-CM | POA: Diagnosis not present

## 2019-01-03 DIAGNOSIS — D509 Iron deficiency anemia, unspecified: Secondary | ICD-10-CM | POA: Diagnosis not present

## 2019-01-03 DIAGNOSIS — N2581 Secondary hyperparathyroidism of renal origin: Secondary | ICD-10-CM | POA: Diagnosis not present

## 2019-01-03 DIAGNOSIS — D631 Anemia in chronic kidney disease: Secondary | ICD-10-CM | POA: Diagnosis not present

## 2019-01-03 DIAGNOSIS — Z992 Dependence on renal dialysis: Secondary | ICD-10-CM | POA: Diagnosis not present

## 2019-01-03 DIAGNOSIS — N186 End stage renal disease: Secondary | ICD-10-CM | POA: Diagnosis not present

## 2019-01-06 DIAGNOSIS — N186 End stage renal disease: Secondary | ICD-10-CM | POA: Diagnosis not present

## 2019-01-06 DIAGNOSIS — D631 Anemia in chronic kidney disease: Secondary | ICD-10-CM | POA: Diagnosis not present

## 2019-01-06 DIAGNOSIS — Z992 Dependence on renal dialysis: Secondary | ICD-10-CM | POA: Diagnosis not present

## 2019-01-06 DIAGNOSIS — D509 Iron deficiency anemia, unspecified: Secondary | ICD-10-CM | POA: Diagnosis not present

## 2019-01-06 DIAGNOSIS — N2581 Secondary hyperparathyroidism of renal origin: Secondary | ICD-10-CM | POA: Diagnosis not present

## 2019-01-08 DIAGNOSIS — N186 End stage renal disease: Secondary | ICD-10-CM | POA: Diagnosis not present

## 2019-01-08 DIAGNOSIS — D631 Anemia in chronic kidney disease: Secondary | ICD-10-CM | POA: Diagnosis not present

## 2019-01-08 DIAGNOSIS — N2581 Secondary hyperparathyroidism of renal origin: Secondary | ICD-10-CM | POA: Diagnosis not present

## 2019-01-08 DIAGNOSIS — D509 Iron deficiency anemia, unspecified: Secondary | ICD-10-CM | POA: Diagnosis not present

## 2019-01-08 DIAGNOSIS — Z992 Dependence on renal dialysis: Secondary | ICD-10-CM | POA: Diagnosis not present

## 2019-01-10 DIAGNOSIS — Z992 Dependence on renal dialysis: Secondary | ICD-10-CM | POA: Diagnosis not present

## 2019-01-10 DIAGNOSIS — D509 Iron deficiency anemia, unspecified: Secondary | ICD-10-CM | POA: Diagnosis not present

## 2019-01-10 DIAGNOSIS — N2581 Secondary hyperparathyroidism of renal origin: Secondary | ICD-10-CM | POA: Diagnosis not present

## 2019-01-10 DIAGNOSIS — D631 Anemia in chronic kidney disease: Secondary | ICD-10-CM | POA: Diagnosis not present

## 2019-01-10 DIAGNOSIS — N186 End stage renal disease: Secondary | ICD-10-CM | POA: Diagnosis not present

## 2019-01-13 DIAGNOSIS — N2581 Secondary hyperparathyroidism of renal origin: Secondary | ICD-10-CM | POA: Diagnosis not present

## 2019-01-13 DIAGNOSIS — D631 Anemia in chronic kidney disease: Secondary | ICD-10-CM | POA: Diagnosis not present

## 2019-01-13 DIAGNOSIS — Z992 Dependence on renal dialysis: Secondary | ICD-10-CM | POA: Diagnosis not present

## 2019-01-13 DIAGNOSIS — N186 End stage renal disease: Secondary | ICD-10-CM | POA: Diagnosis not present

## 2019-01-13 DIAGNOSIS — D509 Iron deficiency anemia, unspecified: Secondary | ICD-10-CM | POA: Diagnosis not present

## 2019-01-15 DIAGNOSIS — N2581 Secondary hyperparathyroidism of renal origin: Secondary | ICD-10-CM | POA: Diagnosis not present

## 2019-01-15 DIAGNOSIS — D509 Iron deficiency anemia, unspecified: Secondary | ICD-10-CM | POA: Diagnosis not present

## 2019-01-15 DIAGNOSIS — Z992 Dependence on renal dialysis: Secondary | ICD-10-CM | POA: Diagnosis not present

## 2019-01-15 DIAGNOSIS — N186 End stage renal disease: Secondary | ICD-10-CM | POA: Diagnosis not present

## 2019-01-15 DIAGNOSIS — D631 Anemia in chronic kidney disease: Secondary | ICD-10-CM | POA: Diagnosis not present

## 2019-01-17 DIAGNOSIS — D509 Iron deficiency anemia, unspecified: Secondary | ICD-10-CM | POA: Diagnosis not present

## 2019-01-17 DIAGNOSIS — Z992 Dependence on renal dialysis: Secondary | ICD-10-CM | POA: Diagnosis not present

## 2019-01-17 DIAGNOSIS — D631 Anemia in chronic kidney disease: Secondary | ICD-10-CM | POA: Diagnosis not present

## 2019-01-17 DIAGNOSIS — N186 End stage renal disease: Secondary | ICD-10-CM | POA: Diagnosis not present

## 2019-01-17 DIAGNOSIS — N2581 Secondary hyperparathyroidism of renal origin: Secondary | ICD-10-CM | POA: Diagnosis not present

## 2019-01-20 DIAGNOSIS — D631 Anemia in chronic kidney disease: Secondary | ICD-10-CM | POA: Diagnosis not present

## 2019-01-20 DIAGNOSIS — D509 Iron deficiency anemia, unspecified: Secondary | ICD-10-CM | POA: Diagnosis not present

## 2019-01-20 DIAGNOSIS — N2581 Secondary hyperparathyroidism of renal origin: Secondary | ICD-10-CM | POA: Diagnosis not present

## 2019-01-20 DIAGNOSIS — Z992 Dependence on renal dialysis: Secondary | ICD-10-CM | POA: Diagnosis not present

## 2019-01-20 DIAGNOSIS — N186 End stage renal disease: Secondary | ICD-10-CM | POA: Diagnosis not present

## 2019-01-22 DIAGNOSIS — Z992 Dependence on renal dialysis: Secondary | ICD-10-CM | POA: Diagnosis not present

## 2019-01-22 DIAGNOSIS — N2581 Secondary hyperparathyroidism of renal origin: Secondary | ICD-10-CM | POA: Diagnosis not present

## 2019-01-22 DIAGNOSIS — D631 Anemia in chronic kidney disease: Secondary | ICD-10-CM | POA: Diagnosis not present

## 2019-01-22 DIAGNOSIS — N186 End stage renal disease: Secondary | ICD-10-CM | POA: Diagnosis not present

## 2019-01-22 DIAGNOSIS — D509 Iron deficiency anemia, unspecified: Secondary | ICD-10-CM | POA: Diagnosis not present

## 2019-01-24 DIAGNOSIS — D509 Iron deficiency anemia, unspecified: Secondary | ICD-10-CM | POA: Diagnosis not present

## 2019-01-24 DIAGNOSIS — Z992 Dependence on renal dialysis: Secondary | ICD-10-CM | POA: Diagnosis not present

## 2019-01-24 DIAGNOSIS — D631 Anemia in chronic kidney disease: Secondary | ICD-10-CM | POA: Diagnosis not present

## 2019-01-24 DIAGNOSIS — N2581 Secondary hyperparathyroidism of renal origin: Secondary | ICD-10-CM | POA: Diagnosis not present

## 2019-01-24 DIAGNOSIS — N186 End stage renal disease: Secondary | ICD-10-CM | POA: Diagnosis not present

## 2019-01-27 DIAGNOSIS — D631 Anemia in chronic kidney disease: Secondary | ICD-10-CM | POA: Diagnosis not present

## 2019-01-27 DIAGNOSIS — N2581 Secondary hyperparathyroidism of renal origin: Secondary | ICD-10-CM | POA: Diagnosis not present

## 2019-01-27 DIAGNOSIS — Z992 Dependence on renal dialysis: Secondary | ICD-10-CM | POA: Diagnosis not present

## 2019-01-27 DIAGNOSIS — D509 Iron deficiency anemia, unspecified: Secondary | ICD-10-CM | POA: Diagnosis not present

## 2019-01-27 DIAGNOSIS — N186 End stage renal disease: Secondary | ICD-10-CM | POA: Diagnosis not present

## 2019-01-29 DIAGNOSIS — Z992 Dependence on renal dialysis: Secondary | ICD-10-CM | POA: Diagnosis not present

## 2019-01-29 DIAGNOSIS — D509 Iron deficiency anemia, unspecified: Secondary | ICD-10-CM | POA: Diagnosis not present

## 2019-01-29 DIAGNOSIS — N186 End stage renal disease: Secondary | ICD-10-CM | POA: Diagnosis not present

## 2019-01-29 DIAGNOSIS — D631 Anemia in chronic kidney disease: Secondary | ICD-10-CM | POA: Diagnosis not present

## 2019-01-29 DIAGNOSIS — N2581 Secondary hyperparathyroidism of renal origin: Secondary | ICD-10-CM | POA: Diagnosis not present

## 2019-01-31 DIAGNOSIS — D631 Anemia in chronic kidney disease: Secondary | ICD-10-CM | POA: Diagnosis not present

## 2019-01-31 DIAGNOSIS — Z992 Dependence on renal dialysis: Secondary | ICD-10-CM | POA: Diagnosis not present

## 2019-01-31 DIAGNOSIS — N2581 Secondary hyperparathyroidism of renal origin: Secondary | ICD-10-CM | POA: Diagnosis not present

## 2019-01-31 DIAGNOSIS — N186 End stage renal disease: Secondary | ICD-10-CM | POA: Diagnosis not present

## 2019-01-31 DIAGNOSIS — D509 Iron deficiency anemia, unspecified: Secondary | ICD-10-CM | POA: Diagnosis not present

## 2019-02-03 DIAGNOSIS — N2581 Secondary hyperparathyroidism of renal origin: Secondary | ICD-10-CM | POA: Diagnosis not present

## 2019-02-03 DIAGNOSIS — D509 Iron deficiency anemia, unspecified: Secondary | ICD-10-CM | POA: Diagnosis not present

## 2019-02-03 DIAGNOSIS — D631 Anemia in chronic kidney disease: Secondary | ICD-10-CM | POA: Diagnosis not present

## 2019-02-03 DIAGNOSIS — Z992 Dependence on renal dialysis: Secondary | ICD-10-CM | POA: Diagnosis not present

## 2019-02-03 DIAGNOSIS — N186 End stage renal disease: Secondary | ICD-10-CM | POA: Diagnosis not present

## 2019-02-05 DIAGNOSIS — D631 Anemia in chronic kidney disease: Secondary | ICD-10-CM | POA: Diagnosis not present

## 2019-02-05 DIAGNOSIS — Z23 Encounter for immunization: Secondary | ICD-10-CM | POA: Diagnosis not present

## 2019-02-05 DIAGNOSIS — Z992 Dependence on renal dialysis: Secondary | ICD-10-CM | POA: Diagnosis not present

## 2019-02-05 DIAGNOSIS — N186 End stage renal disease: Secondary | ICD-10-CM | POA: Diagnosis not present

## 2019-02-05 DIAGNOSIS — N2581 Secondary hyperparathyroidism of renal origin: Secondary | ICD-10-CM | POA: Diagnosis not present

## 2019-02-10 DIAGNOSIS — N2581 Secondary hyperparathyroidism of renal origin: Secondary | ICD-10-CM | POA: Diagnosis not present

## 2019-02-10 DIAGNOSIS — N186 End stage renal disease: Secondary | ICD-10-CM | POA: Diagnosis not present

## 2019-02-10 DIAGNOSIS — D631 Anemia in chronic kidney disease: Secondary | ICD-10-CM | POA: Diagnosis not present

## 2019-02-10 DIAGNOSIS — Z23 Encounter for immunization: Secondary | ICD-10-CM | POA: Diagnosis not present

## 2019-02-10 DIAGNOSIS — Z992 Dependence on renal dialysis: Secondary | ICD-10-CM | POA: Diagnosis not present

## 2019-02-12 DIAGNOSIS — Z992 Dependence on renal dialysis: Secondary | ICD-10-CM | POA: Diagnosis not present

## 2019-02-12 DIAGNOSIS — N2581 Secondary hyperparathyroidism of renal origin: Secondary | ICD-10-CM | POA: Diagnosis not present

## 2019-02-12 DIAGNOSIS — Z23 Encounter for immunization: Secondary | ICD-10-CM | POA: Diagnosis not present

## 2019-02-12 DIAGNOSIS — D631 Anemia in chronic kidney disease: Secondary | ICD-10-CM | POA: Diagnosis not present

## 2019-02-12 DIAGNOSIS — N186 End stage renal disease: Secondary | ICD-10-CM | POA: Diagnosis not present

## 2019-02-14 DIAGNOSIS — D631 Anemia in chronic kidney disease: Secondary | ICD-10-CM | POA: Diagnosis not present

## 2019-02-14 DIAGNOSIS — Z992 Dependence on renal dialysis: Secondary | ICD-10-CM | POA: Diagnosis not present

## 2019-02-14 DIAGNOSIS — N186 End stage renal disease: Secondary | ICD-10-CM | POA: Diagnosis not present

## 2019-02-14 DIAGNOSIS — Z23 Encounter for immunization: Secondary | ICD-10-CM | POA: Diagnosis not present

## 2019-02-14 DIAGNOSIS — N2581 Secondary hyperparathyroidism of renal origin: Secondary | ICD-10-CM | POA: Diagnosis not present

## 2019-02-19 DIAGNOSIS — N186 End stage renal disease: Secondary | ICD-10-CM | POA: Diagnosis not present

## 2019-02-19 DIAGNOSIS — Z992 Dependence on renal dialysis: Secondary | ICD-10-CM | POA: Diagnosis not present

## 2019-02-19 DIAGNOSIS — D631 Anemia in chronic kidney disease: Secondary | ICD-10-CM | POA: Diagnosis not present

## 2019-02-19 DIAGNOSIS — N2581 Secondary hyperparathyroidism of renal origin: Secondary | ICD-10-CM | POA: Diagnosis not present

## 2019-02-19 DIAGNOSIS — Z23 Encounter for immunization: Secondary | ICD-10-CM | POA: Diagnosis not present

## 2019-02-24 DIAGNOSIS — Z992 Dependence on renal dialysis: Secondary | ICD-10-CM | POA: Diagnosis not present

## 2019-02-24 DIAGNOSIS — N186 End stage renal disease: Secondary | ICD-10-CM | POA: Diagnosis not present

## 2019-02-24 DIAGNOSIS — D631 Anemia in chronic kidney disease: Secondary | ICD-10-CM | POA: Diagnosis not present

## 2019-02-24 DIAGNOSIS — N2581 Secondary hyperparathyroidism of renal origin: Secondary | ICD-10-CM | POA: Diagnosis not present

## 2019-02-24 DIAGNOSIS — Z23 Encounter for immunization: Secondary | ICD-10-CM | POA: Diagnosis not present

## 2019-02-25 ENCOUNTER — Other Ambulatory Visit: Payer: Self-pay | Admitting: *Deleted

## 2019-02-25 DIAGNOSIS — Z20822 Contact with and (suspected) exposure to covid-19: Secondary | ICD-10-CM

## 2019-02-26 ENCOUNTER — Telehealth: Payer: Self-pay | Admitting: Cardiology

## 2019-02-26 DIAGNOSIS — Z23 Encounter for immunization: Secondary | ICD-10-CM | POA: Diagnosis not present

## 2019-02-26 DIAGNOSIS — D631 Anemia in chronic kidney disease: Secondary | ICD-10-CM | POA: Diagnosis not present

## 2019-02-26 DIAGNOSIS — Z992 Dependence on renal dialysis: Secondary | ICD-10-CM | POA: Diagnosis not present

## 2019-02-26 DIAGNOSIS — N2581 Secondary hyperparathyroidism of renal origin: Secondary | ICD-10-CM | POA: Diagnosis not present

## 2019-02-26 DIAGNOSIS — N186 End stage renal disease: Secondary | ICD-10-CM | POA: Diagnosis not present

## 2019-02-26 NOTE — Telephone Encounter (Signed)
Brenda Lee contacting patient numerous times to schedule appointment from recall list. Deleted recall from system.

## 2019-02-27 LAB — NOVEL CORONAVIRUS, NAA: SARS-CoV-2, NAA: DETECTED — AB

## 2019-03-01 DIAGNOSIS — N2581 Secondary hyperparathyroidism of renal origin: Secondary | ICD-10-CM | POA: Diagnosis not present

## 2019-03-01 DIAGNOSIS — Z23 Encounter for immunization: Secondary | ICD-10-CM | POA: Diagnosis not present

## 2019-03-01 DIAGNOSIS — N186 End stage renal disease: Secondary | ICD-10-CM | POA: Diagnosis not present

## 2019-03-01 DIAGNOSIS — Z992 Dependence on renal dialysis: Secondary | ICD-10-CM | POA: Diagnosis not present

## 2019-03-01 DIAGNOSIS — D631 Anemia in chronic kidney disease: Secondary | ICD-10-CM | POA: Diagnosis not present

## 2019-03-03 DIAGNOSIS — U071 COVID-19: Secondary | ICD-10-CM | POA: Diagnosis not present

## 2019-03-03 DIAGNOSIS — Z789 Other specified health status: Secondary | ICD-10-CM | POA: Diagnosis not present

## 2019-03-03 DIAGNOSIS — E1122 Type 2 diabetes mellitus with diabetic chronic kidney disease: Secondary | ICD-10-CM | POA: Diagnosis not present

## 2019-03-03 DIAGNOSIS — I10 Essential (primary) hypertension: Secondary | ICD-10-CM | POA: Diagnosis not present

## 2019-03-03 DIAGNOSIS — Z299 Encounter for prophylactic measures, unspecified: Secondary | ICD-10-CM | POA: Diagnosis not present

## 2019-03-03 DIAGNOSIS — E7800 Pure hypercholesterolemia, unspecified: Secondary | ICD-10-CM | POA: Diagnosis not present

## 2019-03-04 DIAGNOSIS — Z23 Encounter for immunization: Secondary | ICD-10-CM | POA: Diagnosis not present

## 2019-03-04 DIAGNOSIS — N186 End stage renal disease: Secondary | ICD-10-CM | POA: Diagnosis not present

## 2019-03-04 DIAGNOSIS — Z992 Dependence on renal dialysis: Secondary | ICD-10-CM | POA: Diagnosis not present

## 2019-03-04 DIAGNOSIS — N2581 Secondary hyperparathyroidism of renal origin: Secondary | ICD-10-CM | POA: Diagnosis not present

## 2019-03-04 DIAGNOSIS — D631 Anemia in chronic kidney disease: Secondary | ICD-10-CM | POA: Diagnosis not present

## 2019-03-05 DIAGNOSIS — N186 End stage renal disease: Secondary | ICD-10-CM | POA: Diagnosis not present

## 2019-03-05 DIAGNOSIS — Z992 Dependence on renal dialysis: Secondary | ICD-10-CM | POA: Diagnosis not present

## 2019-03-06 DIAGNOSIS — N186 End stage renal disease: Secondary | ICD-10-CM | POA: Diagnosis not present

## 2019-03-06 DIAGNOSIS — D509 Iron deficiency anemia, unspecified: Secondary | ICD-10-CM | POA: Diagnosis not present

## 2019-03-06 DIAGNOSIS — Z992 Dependence on renal dialysis: Secondary | ICD-10-CM | POA: Diagnosis not present

## 2019-03-06 DIAGNOSIS — N2581 Secondary hyperparathyroidism of renal origin: Secondary | ICD-10-CM | POA: Diagnosis not present

## 2019-03-06 DIAGNOSIS — D631 Anemia in chronic kidney disease: Secondary | ICD-10-CM | POA: Diagnosis not present

## 2019-03-08 DIAGNOSIS — N2581 Secondary hyperparathyroidism of renal origin: Secondary | ICD-10-CM | POA: Diagnosis not present

## 2019-03-08 DIAGNOSIS — D509 Iron deficiency anemia, unspecified: Secondary | ICD-10-CM | POA: Diagnosis not present

## 2019-03-08 DIAGNOSIS — N186 End stage renal disease: Secondary | ICD-10-CM | POA: Diagnosis not present

## 2019-03-08 DIAGNOSIS — D631 Anemia in chronic kidney disease: Secondary | ICD-10-CM | POA: Diagnosis not present

## 2019-03-08 DIAGNOSIS — Z992 Dependence on renal dialysis: Secondary | ICD-10-CM | POA: Diagnosis not present

## 2019-03-11 ENCOUNTER — Other Ambulatory Visit: Payer: Self-pay | Admitting: *Deleted

## 2019-03-11 DIAGNOSIS — Z992 Dependence on renal dialysis: Secondary | ICD-10-CM | POA: Diagnosis not present

## 2019-03-11 DIAGNOSIS — D509 Iron deficiency anemia, unspecified: Secondary | ICD-10-CM | POA: Diagnosis not present

## 2019-03-11 DIAGNOSIS — Z20822 Contact with and (suspected) exposure to covid-19: Secondary | ICD-10-CM

## 2019-03-11 DIAGNOSIS — N186 End stage renal disease: Secondary | ICD-10-CM | POA: Diagnosis not present

## 2019-03-11 DIAGNOSIS — D631 Anemia in chronic kidney disease: Secondary | ICD-10-CM | POA: Diagnosis not present

## 2019-03-11 DIAGNOSIS — N2581 Secondary hyperparathyroidism of renal origin: Secondary | ICD-10-CM | POA: Diagnosis not present

## 2019-03-12 DIAGNOSIS — N186 End stage renal disease: Secondary | ICD-10-CM | POA: Diagnosis not present

## 2019-03-12 DIAGNOSIS — D509 Iron deficiency anemia, unspecified: Secondary | ICD-10-CM | POA: Diagnosis not present

## 2019-03-12 DIAGNOSIS — D631 Anemia in chronic kidney disease: Secondary | ICD-10-CM | POA: Diagnosis not present

## 2019-03-12 DIAGNOSIS — Z992 Dependence on renal dialysis: Secondary | ICD-10-CM | POA: Diagnosis not present

## 2019-03-12 DIAGNOSIS — N2581 Secondary hyperparathyroidism of renal origin: Secondary | ICD-10-CM | POA: Diagnosis not present

## 2019-03-13 LAB — NOVEL CORONAVIRUS, NAA: SARS-CoV-2, NAA: NOT DETECTED

## 2019-03-14 DIAGNOSIS — Z992 Dependence on renal dialysis: Secondary | ICD-10-CM | POA: Diagnosis not present

## 2019-03-14 DIAGNOSIS — D631 Anemia in chronic kidney disease: Secondary | ICD-10-CM | POA: Diagnosis not present

## 2019-03-14 DIAGNOSIS — N186 End stage renal disease: Secondary | ICD-10-CM | POA: Diagnosis not present

## 2019-03-14 DIAGNOSIS — N2581 Secondary hyperparathyroidism of renal origin: Secondary | ICD-10-CM | POA: Diagnosis not present

## 2019-03-14 DIAGNOSIS — D509 Iron deficiency anemia, unspecified: Secondary | ICD-10-CM | POA: Diagnosis not present

## 2019-03-17 DIAGNOSIS — N2581 Secondary hyperparathyroidism of renal origin: Secondary | ICD-10-CM | POA: Diagnosis not present

## 2019-03-17 DIAGNOSIS — Z992 Dependence on renal dialysis: Secondary | ICD-10-CM | POA: Diagnosis not present

## 2019-03-17 DIAGNOSIS — D509 Iron deficiency anemia, unspecified: Secondary | ICD-10-CM | POA: Diagnosis not present

## 2019-03-17 DIAGNOSIS — D631 Anemia in chronic kidney disease: Secondary | ICD-10-CM | POA: Diagnosis not present

## 2019-03-17 DIAGNOSIS — N186 End stage renal disease: Secondary | ICD-10-CM | POA: Diagnosis not present

## 2019-03-19 DIAGNOSIS — N186 End stage renal disease: Secondary | ICD-10-CM | POA: Diagnosis not present

## 2019-03-19 DIAGNOSIS — N2581 Secondary hyperparathyroidism of renal origin: Secondary | ICD-10-CM | POA: Diagnosis not present

## 2019-03-19 DIAGNOSIS — D509 Iron deficiency anemia, unspecified: Secondary | ICD-10-CM | POA: Diagnosis not present

## 2019-03-19 DIAGNOSIS — Z992 Dependence on renal dialysis: Secondary | ICD-10-CM | POA: Diagnosis not present

## 2019-03-19 DIAGNOSIS — D631 Anemia in chronic kidney disease: Secondary | ICD-10-CM | POA: Diagnosis not present

## 2019-03-21 DIAGNOSIS — D509 Iron deficiency anemia, unspecified: Secondary | ICD-10-CM | POA: Diagnosis not present

## 2019-03-21 DIAGNOSIS — N186 End stage renal disease: Secondary | ICD-10-CM | POA: Diagnosis not present

## 2019-03-21 DIAGNOSIS — D631 Anemia in chronic kidney disease: Secondary | ICD-10-CM | POA: Diagnosis not present

## 2019-03-21 DIAGNOSIS — N2581 Secondary hyperparathyroidism of renal origin: Secondary | ICD-10-CM | POA: Diagnosis not present

## 2019-03-21 DIAGNOSIS — Z992 Dependence on renal dialysis: Secondary | ICD-10-CM | POA: Diagnosis not present

## 2019-03-24 DIAGNOSIS — Z992 Dependence on renal dialysis: Secondary | ICD-10-CM | POA: Diagnosis not present

## 2019-03-24 DIAGNOSIS — D509 Iron deficiency anemia, unspecified: Secondary | ICD-10-CM | POA: Diagnosis not present

## 2019-03-24 DIAGNOSIS — D631 Anemia in chronic kidney disease: Secondary | ICD-10-CM | POA: Diagnosis not present

## 2019-03-24 DIAGNOSIS — N2581 Secondary hyperparathyroidism of renal origin: Secondary | ICD-10-CM | POA: Diagnosis not present

## 2019-03-24 DIAGNOSIS — N186 End stage renal disease: Secondary | ICD-10-CM | POA: Diagnosis not present

## 2019-03-26 DIAGNOSIS — D631 Anemia in chronic kidney disease: Secondary | ICD-10-CM | POA: Diagnosis not present

## 2019-03-26 DIAGNOSIS — Z992 Dependence on renal dialysis: Secondary | ICD-10-CM | POA: Diagnosis not present

## 2019-03-26 DIAGNOSIS — N2581 Secondary hyperparathyroidism of renal origin: Secondary | ICD-10-CM | POA: Diagnosis not present

## 2019-03-26 DIAGNOSIS — N186 End stage renal disease: Secondary | ICD-10-CM | POA: Diagnosis not present

## 2019-03-26 DIAGNOSIS — D509 Iron deficiency anemia, unspecified: Secondary | ICD-10-CM | POA: Diagnosis not present

## 2019-03-28 DIAGNOSIS — N2581 Secondary hyperparathyroidism of renal origin: Secondary | ICD-10-CM | POA: Diagnosis not present

## 2019-03-28 DIAGNOSIS — Z992 Dependence on renal dialysis: Secondary | ICD-10-CM | POA: Diagnosis not present

## 2019-03-28 DIAGNOSIS — D631 Anemia in chronic kidney disease: Secondary | ICD-10-CM | POA: Diagnosis not present

## 2019-03-28 DIAGNOSIS — D509 Iron deficiency anemia, unspecified: Secondary | ICD-10-CM | POA: Diagnosis not present

## 2019-03-28 DIAGNOSIS — N186 End stage renal disease: Secondary | ICD-10-CM | POA: Diagnosis not present

## 2019-03-31 DIAGNOSIS — Z992 Dependence on renal dialysis: Secondary | ICD-10-CM | POA: Diagnosis not present

## 2019-03-31 DIAGNOSIS — D509 Iron deficiency anemia, unspecified: Secondary | ICD-10-CM | POA: Diagnosis not present

## 2019-03-31 DIAGNOSIS — N186 End stage renal disease: Secondary | ICD-10-CM | POA: Diagnosis not present

## 2019-03-31 DIAGNOSIS — N2581 Secondary hyperparathyroidism of renal origin: Secondary | ICD-10-CM | POA: Diagnosis not present

## 2019-03-31 DIAGNOSIS — D631 Anemia in chronic kidney disease: Secondary | ICD-10-CM | POA: Diagnosis not present

## 2019-04-02 DIAGNOSIS — Z992 Dependence on renal dialysis: Secondary | ICD-10-CM | POA: Diagnosis not present

## 2019-04-02 DIAGNOSIS — D509 Iron deficiency anemia, unspecified: Secondary | ICD-10-CM | POA: Diagnosis not present

## 2019-04-02 DIAGNOSIS — N186 End stage renal disease: Secondary | ICD-10-CM | POA: Diagnosis not present

## 2019-04-02 DIAGNOSIS — D631 Anemia in chronic kidney disease: Secondary | ICD-10-CM | POA: Diagnosis not present

## 2019-04-02 DIAGNOSIS — N2581 Secondary hyperparathyroidism of renal origin: Secondary | ICD-10-CM | POA: Diagnosis not present

## 2019-04-04 DIAGNOSIS — N2581 Secondary hyperparathyroidism of renal origin: Secondary | ICD-10-CM | POA: Diagnosis not present

## 2019-04-04 DIAGNOSIS — Z992 Dependence on renal dialysis: Secondary | ICD-10-CM | POA: Diagnosis not present

## 2019-04-04 DIAGNOSIS — D631 Anemia in chronic kidney disease: Secondary | ICD-10-CM | POA: Diagnosis not present

## 2019-04-04 DIAGNOSIS — D509 Iron deficiency anemia, unspecified: Secondary | ICD-10-CM | POA: Diagnosis not present

## 2019-04-04 DIAGNOSIS — N186 End stage renal disease: Secondary | ICD-10-CM | POA: Diagnosis not present

## 2019-04-05 DIAGNOSIS — N186 End stage renal disease: Secondary | ICD-10-CM | POA: Diagnosis not present

## 2019-04-05 DIAGNOSIS — Z992 Dependence on renal dialysis: Secondary | ICD-10-CM | POA: Diagnosis not present

## 2019-04-08 DIAGNOSIS — N2581 Secondary hyperparathyroidism of renal origin: Secondary | ICD-10-CM | POA: Diagnosis not present

## 2019-04-08 DIAGNOSIS — Z992 Dependence on renal dialysis: Secondary | ICD-10-CM | POA: Diagnosis not present

## 2019-04-08 DIAGNOSIS — D631 Anemia in chronic kidney disease: Secondary | ICD-10-CM | POA: Diagnosis not present

## 2019-04-08 DIAGNOSIS — N186 End stage renal disease: Secondary | ICD-10-CM | POA: Diagnosis not present

## 2019-04-08 DIAGNOSIS — D509 Iron deficiency anemia, unspecified: Secondary | ICD-10-CM | POA: Diagnosis not present

## 2019-04-09 DIAGNOSIS — D509 Iron deficiency anemia, unspecified: Secondary | ICD-10-CM | POA: Diagnosis not present

## 2019-04-09 DIAGNOSIS — N186 End stage renal disease: Secondary | ICD-10-CM | POA: Diagnosis not present

## 2019-04-09 DIAGNOSIS — Z992 Dependence on renal dialysis: Secondary | ICD-10-CM | POA: Diagnosis not present

## 2019-04-09 DIAGNOSIS — N2581 Secondary hyperparathyroidism of renal origin: Secondary | ICD-10-CM | POA: Diagnosis not present

## 2019-04-09 DIAGNOSIS — D631 Anemia in chronic kidney disease: Secondary | ICD-10-CM | POA: Diagnosis not present

## 2019-04-11 DIAGNOSIS — D509 Iron deficiency anemia, unspecified: Secondary | ICD-10-CM | POA: Diagnosis not present

## 2019-04-11 DIAGNOSIS — N2581 Secondary hyperparathyroidism of renal origin: Secondary | ICD-10-CM | POA: Diagnosis not present

## 2019-04-11 DIAGNOSIS — D631 Anemia in chronic kidney disease: Secondary | ICD-10-CM | POA: Diagnosis not present

## 2019-04-11 DIAGNOSIS — N186 End stage renal disease: Secondary | ICD-10-CM | POA: Diagnosis not present

## 2019-04-11 DIAGNOSIS — Z992 Dependence on renal dialysis: Secondary | ICD-10-CM | POA: Diagnosis not present

## 2019-04-14 DIAGNOSIS — N2581 Secondary hyperparathyroidism of renal origin: Secondary | ICD-10-CM | POA: Diagnosis not present

## 2019-04-14 DIAGNOSIS — D509 Iron deficiency anemia, unspecified: Secondary | ICD-10-CM | POA: Diagnosis not present

## 2019-04-14 DIAGNOSIS — N186 End stage renal disease: Secondary | ICD-10-CM | POA: Diagnosis not present

## 2019-04-14 DIAGNOSIS — D631 Anemia in chronic kidney disease: Secondary | ICD-10-CM | POA: Diagnosis not present

## 2019-04-14 DIAGNOSIS — Z992 Dependence on renal dialysis: Secondary | ICD-10-CM | POA: Diagnosis not present

## 2019-04-16 DIAGNOSIS — D631 Anemia in chronic kidney disease: Secondary | ICD-10-CM | POA: Diagnosis not present

## 2019-04-16 DIAGNOSIS — N186 End stage renal disease: Secondary | ICD-10-CM | POA: Diagnosis not present

## 2019-04-16 DIAGNOSIS — Z992 Dependence on renal dialysis: Secondary | ICD-10-CM | POA: Diagnosis not present

## 2019-04-16 DIAGNOSIS — D509 Iron deficiency anemia, unspecified: Secondary | ICD-10-CM | POA: Diagnosis not present

## 2019-04-16 DIAGNOSIS — N2581 Secondary hyperparathyroidism of renal origin: Secondary | ICD-10-CM | POA: Diagnosis not present

## 2019-04-19 DIAGNOSIS — N186 End stage renal disease: Secondary | ICD-10-CM | POA: Diagnosis not present

## 2019-04-19 DIAGNOSIS — Z992 Dependence on renal dialysis: Secondary | ICD-10-CM | POA: Diagnosis not present

## 2019-04-19 DIAGNOSIS — D631 Anemia in chronic kidney disease: Secondary | ICD-10-CM | POA: Diagnosis not present

## 2019-04-19 DIAGNOSIS — N2581 Secondary hyperparathyroidism of renal origin: Secondary | ICD-10-CM | POA: Diagnosis not present

## 2019-04-19 DIAGNOSIS — D509 Iron deficiency anemia, unspecified: Secondary | ICD-10-CM | POA: Diagnosis not present

## 2019-04-21 DIAGNOSIS — D509 Iron deficiency anemia, unspecified: Secondary | ICD-10-CM | POA: Diagnosis not present

## 2019-04-21 DIAGNOSIS — Z992 Dependence on renal dialysis: Secondary | ICD-10-CM | POA: Diagnosis not present

## 2019-04-21 DIAGNOSIS — N2581 Secondary hyperparathyroidism of renal origin: Secondary | ICD-10-CM | POA: Diagnosis not present

## 2019-04-21 DIAGNOSIS — N186 End stage renal disease: Secondary | ICD-10-CM | POA: Diagnosis not present

## 2019-04-21 DIAGNOSIS — D631 Anemia in chronic kidney disease: Secondary | ICD-10-CM | POA: Diagnosis not present

## 2019-04-23 DIAGNOSIS — D631 Anemia in chronic kidney disease: Secondary | ICD-10-CM | POA: Diagnosis not present

## 2019-04-23 DIAGNOSIS — D509 Iron deficiency anemia, unspecified: Secondary | ICD-10-CM | POA: Diagnosis not present

## 2019-04-23 DIAGNOSIS — N2581 Secondary hyperparathyroidism of renal origin: Secondary | ICD-10-CM | POA: Diagnosis not present

## 2019-04-23 DIAGNOSIS — Z992 Dependence on renal dialysis: Secondary | ICD-10-CM | POA: Diagnosis not present

## 2019-04-23 DIAGNOSIS — N186 End stage renal disease: Secondary | ICD-10-CM | POA: Diagnosis not present

## 2019-04-25 DIAGNOSIS — D509 Iron deficiency anemia, unspecified: Secondary | ICD-10-CM | POA: Diagnosis not present

## 2019-04-25 DIAGNOSIS — D631 Anemia in chronic kidney disease: Secondary | ICD-10-CM | POA: Diagnosis not present

## 2019-04-25 DIAGNOSIS — N186 End stage renal disease: Secondary | ICD-10-CM | POA: Diagnosis not present

## 2019-04-25 DIAGNOSIS — Z992 Dependence on renal dialysis: Secondary | ICD-10-CM | POA: Diagnosis not present

## 2019-04-25 DIAGNOSIS — N2581 Secondary hyperparathyroidism of renal origin: Secondary | ICD-10-CM | POA: Diagnosis not present

## 2019-04-28 DIAGNOSIS — D509 Iron deficiency anemia, unspecified: Secondary | ICD-10-CM | POA: Diagnosis not present

## 2019-04-28 DIAGNOSIS — Z992 Dependence on renal dialysis: Secondary | ICD-10-CM | POA: Diagnosis not present

## 2019-04-28 DIAGNOSIS — D631 Anemia in chronic kidney disease: Secondary | ICD-10-CM | POA: Diagnosis not present

## 2019-04-28 DIAGNOSIS — N186 End stage renal disease: Secondary | ICD-10-CM | POA: Diagnosis not present

## 2019-04-28 DIAGNOSIS — N2581 Secondary hyperparathyroidism of renal origin: Secondary | ICD-10-CM | POA: Diagnosis not present

## 2019-04-30 DIAGNOSIS — D509 Iron deficiency anemia, unspecified: Secondary | ICD-10-CM | POA: Diagnosis not present

## 2019-04-30 DIAGNOSIS — N2581 Secondary hyperparathyroidism of renal origin: Secondary | ICD-10-CM | POA: Diagnosis not present

## 2019-04-30 DIAGNOSIS — D631 Anemia in chronic kidney disease: Secondary | ICD-10-CM | POA: Diagnosis not present

## 2019-04-30 DIAGNOSIS — N186 End stage renal disease: Secondary | ICD-10-CM | POA: Diagnosis not present

## 2019-04-30 DIAGNOSIS — Z992 Dependence on renal dialysis: Secondary | ICD-10-CM | POA: Diagnosis not present

## 2019-05-05 DIAGNOSIS — D631 Anemia in chronic kidney disease: Secondary | ICD-10-CM | POA: Diagnosis not present

## 2019-05-05 DIAGNOSIS — N2581 Secondary hyperparathyroidism of renal origin: Secondary | ICD-10-CM | POA: Diagnosis not present

## 2019-05-05 DIAGNOSIS — Z992 Dependence on renal dialysis: Secondary | ICD-10-CM | POA: Diagnosis not present

## 2019-05-05 DIAGNOSIS — N186 End stage renal disease: Secondary | ICD-10-CM | POA: Diagnosis not present

## 2019-05-05 DIAGNOSIS — D509 Iron deficiency anemia, unspecified: Secondary | ICD-10-CM | POA: Diagnosis not present

## 2019-06-09 DIAGNOSIS — D631 Anemia in chronic kidney disease: Secondary | ICD-10-CM | POA: Diagnosis not present

## 2019-06-09 DIAGNOSIS — D509 Iron deficiency anemia, unspecified: Secondary | ICD-10-CM | POA: Diagnosis not present

## 2019-06-09 DIAGNOSIS — Z992 Dependence on renal dialysis: Secondary | ICD-10-CM | POA: Diagnosis not present

## 2019-06-09 DIAGNOSIS — N2581 Secondary hyperparathyroidism of renal origin: Secondary | ICD-10-CM | POA: Diagnosis not present

## 2019-06-09 DIAGNOSIS — N186 End stage renal disease: Secondary | ICD-10-CM | POA: Diagnosis not present

## 2019-06-11 DIAGNOSIS — D509 Iron deficiency anemia, unspecified: Secondary | ICD-10-CM | POA: Diagnosis not present

## 2019-06-11 DIAGNOSIS — N186 End stage renal disease: Secondary | ICD-10-CM | POA: Diagnosis not present

## 2019-06-11 DIAGNOSIS — N2581 Secondary hyperparathyroidism of renal origin: Secondary | ICD-10-CM | POA: Diagnosis not present

## 2019-06-11 DIAGNOSIS — Z992 Dependence on renal dialysis: Secondary | ICD-10-CM | POA: Diagnosis not present

## 2019-06-11 DIAGNOSIS — D631 Anemia in chronic kidney disease: Secondary | ICD-10-CM | POA: Diagnosis not present

## 2019-06-16 DIAGNOSIS — Z992 Dependence on renal dialysis: Secondary | ICD-10-CM | POA: Diagnosis not present

## 2019-06-16 DIAGNOSIS — D631 Anemia in chronic kidney disease: Secondary | ICD-10-CM | POA: Diagnosis not present

## 2019-06-16 DIAGNOSIS — N2581 Secondary hyperparathyroidism of renal origin: Secondary | ICD-10-CM | POA: Diagnosis not present

## 2019-06-16 DIAGNOSIS — N186 End stage renal disease: Secondary | ICD-10-CM | POA: Diagnosis not present

## 2019-06-16 DIAGNOSIS — D509 Iron deficiency anemia, unspecified: Secondary | ICD-10-CM | POA: Diagnosis not present

## 2019-06-18 DIAGNOSIS — Z992 Dependence on renal dialysis: Secondary | ICD-10-CM | POA: Diagnosis not present

## 2019-06-18 DIAGNOSIS — D509 Iron deficiency anemia, unspecified: Secondary | ICD-10-CM | POA: Diagnosis not present

## 2019-06-18 DIAGNOSIS — N2581 Secondary hyperparathyroidism of renal origin: Secondary | ICD-10-CM | POA: Diagnosis not present

## 2019-06-18 DIAGNOSIS — D631 Anemia in chronic kidney disease: Secondary | ICD-10-CM | POA: Diagnosis not present

## 2019-06-18 DIAGNOSIS — N186 End stage renal disease: Secondary | ICD-10-CM | POA: Diagnosis not present

## 2019-06-20 DIAGNOSIS — D509 Iron deficiency anemia, unspecified: Secondary | ICD-10-CM | POA: Diagnosis not present

## 2019-06-20 DIAGNOSIS — Z992 Dependence on renal dialysis: Secondary | ICD-10-CM | POA: Diagnosis not present

## 2019-06-20 DIAGNOSIS — D631 Anemia in chronic kidney disease: Secondary | ICD-10-CM | POA: Diagnosis not present

## 2019-06-20 DIAGNOSIS — N186 End stage renal disease: Secondary | ICD-10-CM | POA: Diagnosis not present

## 2019-06-20 DIAGNOSIS — N2581 Secondary hyperparathyroidism of renal origin: Secondary | ICD-10-CM | POA: Diagnosis not present

## 2019-06-23 DIAGNOSIS — Z992 Dependence on renal dialysis: Secondary | ICD-10-CM | POA: Diagnosis not present

## 2019-06-23 DIAGNOSIS — N186 End stage renal disease: Secondary | ICD-10-CM | POA: Diagnosis not present

## 2019-06-23 DIAGNOSIS — N2581 Secondary hyperparathyroidism of renal origin: Secondary | ICD-10-CM | POA: Diagnosis not present

## 2019-06-23 DIAGNOSIS — D631 Anemia in chronic kidney disease: Secondary | ICD-10-CM | POA: Diagnosis not present

## 2019-06-23 DIAGNOSIS — D509 Iron deficiency anemia, unspecified: Secondary | ICD-10-CM | POA: Diagnosis not present

## 2019-06-25 DIAGNOSIS — N186 End stage renal disease: Secondary | ICD-10-CM | POA: Diagnosis not present

## 2019-06-25 DIAGNOSIS — N2581 Secondary hyperparathyroidism of renal origin: Secondary | ICD-10-CM | POA: Diagnosis not present

## 2019-06-25 DIAGNOSIS — Z992 Dependence on renal dialysis: Secondary | ICD-10-CM | POA: Diagnosis not present

## 2019-06-25 DIAGNOSIS — D509 Iron deficiency anemia, unspecified: Secondary | ICD-10-CM | POA: Diagnosis not present

## 2019-06-25 DIAGNOSIS — D631 Anemia in chronic kidney disease: Secondary | ICD-10-CM | POA: Diagnosis not present

## 2019-06-27 DIAGNOSIS — D631 Anemia in chronic kidney disease: Secondary | ICD-10-CM | POA: Diagnosis not present

## 2019-06-27 DIAGNOSIS — N186 End stage renal disease: Secondary | ICD-10-CM | POA: Diagnosis not present

## 2019-06-27 DIAGNOSIS — D509 Iron deficiency anemia, unspecified: Secondary | ICD-10-CM | POA: Diagnosis not present

## 2019-06-27 DIAGNOSIS — Z992 Dependence on renal dialysis: Secondary | ICD-10-CM | POA: Diagnosis not present

## 2019-06-27 DIAGNOSIS — N2581 Secondary hyperparathyroidism of renal origin: Secondary | ICD-10-CM | POA: Diagnosis not present

## 2019-06-30 DIAGNOSIS — D509 Iron deficiency anemia, unspecified: Secondary | ICD-10-CM | POA: Diagnosis not present

## 2019-06-30 DIAGNOSIS — Z992 Dependence on renal dialysis: Secondary | ICD-10-CM | POA: Diagnosis not present

## 2019-06-30 DIAGNOSIS — N2581 Secondary hyperparathyroidism of renal origin: Secondary | ICD-10-CM | POA: Diagnosis not present

## 2019-06-30 DIAGNOSIS — D631 Anemia in chronic kidney disease: Secondary | ICD-10-CM | POA: Diagnosis not present

## 2019-06-30 DIAGNOSIS — N186 End stage renal disease: Secondary | ICD-10-CM | POA: Diagnosis not present

## 2019-07-02 DIAGNOSIS — D509 Iron deficiency anemia, unspecified: Secondary | ICD-10-CM | POA: Diagnosis not present

## 2019-07-02 DIAGNOSIS — N186 End stage renal disease: Secondary | ICD-10-CM | POA: Diagnosis not present

## 2019-07-02 DIAGNOSIS — N2581 Secondary hyperparathyroidism of renal origin: Secondary | ICD-10-CM | POA: Diagnosis not present

## 2019-07-02 DIAGNOSIS — Z992 Dependence on renal dialysis: Secondary | ICD-10-CM | POA: Diagnosis not present

## 2019-07-02 DIAGNOSIS — D631 Anemia in chronic kidney disease: Secondary | ICD-10-CM | POA: Diagnosis not present

## 2019-07-04 DIAGNOSIS — N2581 Secondary hyperparathyroidism of renal origin: Secondary | ICD-10-CM | POA: Diagnosis not present

## 2019-07-04 DIAGNOSIS — N186 End stage renal disease: Secondary | ICD-10-CM | POA: Diagnosis not present

## 2019-07-04 DIAGNOSIS — D509 Iron deficiency anemia, unspecified: Secondary | ICD-10-CM | POA: Diagnosis not present

## 2019-07-04 DIAGNOSIS — D631 Anemia in chronic kidney disease: Secondary | ICD-10-CM | POA: Diagnosis not present

## 2019-07-04 DIAGNOSIS — Z992 Dependence on renal dialysis: Secondary | ICD-10-CM | POA: Diagnosis not present

## 2019-07-06 DIAGNOSIS — N186 End stage renal disease: Secondary | ICD-10-CM | POA: Diagnosis not present

## 2019-07-06 DIAGNOSIS — Z992 Dependence on renal dialysis: Secondary | ICD-10-CM | POA: Diagnosis not present

## 2019-07-07 DIAGNOSIS — D631 Anemia in chronic kidney disease: Secondary | ICD-10-CM | POA: Diagnosis not present

## 2019-07-07 DIAGNOSIS — Z992 Dependence on renal dialysis: Secondary | ICD-10-CM | POA: Diagnosis not present

## 2019-07-07 DIAGNOSIS — N186 End stage renal disease: Secondary | ICD-10-CM | POA: Diagnosis not present

## 2019-07-07 DIAGNOSIS — N2581 Secondary hyperparathyroidism of renal origin: Secondary | ICD-10-CM | POA: Diagnosis not present

## 2019-07-09 DIAGNOSIS — Z992 Dependence on renal dialysis: Secondary | ICD-10-CM | POA: Diagnosis not present

## 2019-07-09 DIAGNOSIS — D631 Anemia in chronic kidney disease: Secondary | ICD-10-CM | POA: Diagnosis not present

## 2019-07-09 DIAGNOSIS — N186 End stage renal disease: Secondary | ICD-10-CM | POA: Diagnosis not present

## 2019-07-09 DIAGNOSIS — N2581 Secondary hyperparathyroidism of renal origin: Secondary | ICD-10-CM | POA: Diagnosis not present

## 2019-07-11 DIAGNOSIS — N2581 Secondary hyperparathyroidism of renal origin: Secondary | ICD-10-CM | POA: Diagnosis not present

## 2019-07-11 DIAGNOSIS — N186 End stage renal disease: Secondary | ICD-10-CM | POA: Diagnosis not present

## 2019-07-11 DIAGNOSIS — Z992 Dependence on renal dialysis: Secondary | ICD-10-CM | POA: Diagnosis not present

## 2019-07-11 DIAGNOSIS — D631 Anemia in chronic kidney disease: Secondary | ICD-10-CM | POA: Diagnosis not present

## 2019-07-14 DIAGNOSIS — Z992 Dependence on renal dialysis: Secondary | ICD-10-CM | POA: Diagnosis not present

## 2019-07-14 DIAGNOSIS — N186 End stage renal disease: Secondary | ICD-10-CM | POA: Diagnosis not present

## 2019-07-14 DIAGNOSIS — N2581 Secondary hyperparathyroidism of renal origin: Secondary | ICD-10-CM | POA: Diagnosis not present

## 2019-07-14 DIAGNOSIS — D631 Anemia in chronic kidney disease: Secondary | ICD-10-CM | POA: Diagnosis not present

## 2019-07-17 DIAGNOSIS — N186 End stage renal disease: Secondary | ICD-10-CM | POA: Diagnosis not present

## 2019-07-17 DIAGNOSIS — N2581 Secondary hyperparathyroidism of renal origin: Secondary | ICD-10-CM | POA: Diagnosis not present

## 2019-07-17 DIAGNOSIS — D631 Anemia in chronic kidney disease: Secondary | ICD-10-CM | POA: Diagnosis not present

## 2019-07-17 DIAGNOSIS — Z992 Dependence on renal dialysis: Secondary | ICD-10-CM | POA: Diagnosis not present

## 2019-07-18 DIAGNOSIS — N2581 Secondary hyperparathyroidism of renal origin: Secondary | ICD-10-CM | POA: Diagnosis not present

## 2019-07-18 DIAGNOSIS — Z992 Dependence on renal dialysis: Secondary | ICD-10-CM | POA: Diagnosis not present

## 2019-07-18 DIAGNOSIS — N186 End stage renal disease: Secondary | ICD-10-CM | POA: Diagnosis not present

## 2019-07-18 DIAGNOSIS — D631 Anemia in chronic kidney disease: Secondary | ICD-10-CM | POA: Diagnosis not present

## 2019-07-23 DIAGNOSIS — Z992 Dependence on renal dialysis: Secondary | ICD-10-CM | POA: Diagnosis not present

## 2019-07-23 DIAGNOSIS — N2581 Secondary hyperparathyroidism of renal origin: Secondary | ICD-10-CM | POA: Diagnosis not present

## 2019-07-23 DIAGNOSIS — N186 End stage renal disease: Secondary | ICD-10-CM | POA: Diagnosis not present

## 2019-07-23 DIAGNOSIS — D631 Anemia in chronic kidney disease: Secondary | ICD-10-CM | POA: Diagnosis not present

## 2019-07-25 DIAGNOSIS — N186 End stage renal disease: Secondary | ICD-10-CM | POA: Diagnosis not present

## 2019-07-25 DIAGNOSIS — Z992 Dependence on renal dialysis: Secondary | ICD-10-CM | POA: Diagnosis not present

## 2019-07-25 DIAGNOSIS — D631 Anemia in chronic kidney disease: Secondary | ICD-10-CM | POA: Diagnosis not present

## 2019-07-25 DIAGNOSIS — N2581 Secondary hyperparathyroidism of renal origin: Secondary | ICD-10-CM | POA: Diagnosis not present

## 2019-07-28 DIAGNOSIS — N2581 Secondary hyperparathyroidism of renal origin: Secondary | ICD-10-CM | POA: Diagnosis not present

## 2019-07-28 DIAGNOSIS — Z992 Dependence on renal dialysis: Secondary | ICD-10-CM | POA: Diagnosis not present

## 2019-07-28 DIAGNOSIS — N186 End stage renal disease: Secondary | ICD-10-CM | POA: Diagnosis not present

## 2019-07-28 DIAGNOSIS — D631 Anemia in chronic kidney disease: Secondary | ICD-10-CM | POA: Diagnosis not present

## 2019-07-30 DIAGNOSIS — D631 Anemia in chronic kidney disease: Secondary | ICD-10-CM | POA: Diagnosis not present

## 2019-07-30 DIAGNOSIS — N2581 Secondary hyperparathyroidism of renal origin: Secondary | ICD-10-CM | POA: Diagnosis not present

## 2019-07-30 DIAGNOSIS — N186 End stage renal disease: Secondary | ICD-10-CM | POA: Diagnosis not present

## 2019-07-30 DIAGNOSIS — Z992 Dependence on renal dialysis: Secondary | ICD-10-CM | POA: Diagnosis not present

## 2019-08-01 DIAGNOSIS — Z992 Dependence on renal dialysis: Secondary | ICD-10-CM | POA: Diagnosis not present

## 2019-08-01 DIAGNOSIS — N186 End stage renal disease: Secondary | ICD-10-CM | POA: Diagnosis not present

## 2019-08-01 DIAGNOSIS — D631 Anemia in chronic kidney disease: Secondary | ICD-10-CM | POA: Diagnosis not present

## 2019-08-01 DIAGNOSIS — N2581 Secondary hyperparathyroidism of renal origin: Secondary | ICD-10-CM | POA: Diagnosis not present

## 2019-08-03 DIAGNOSIS — N186 End stage renal disease: Secondary | ICD-10-CM | POA: Diagnosis not present

## 2019-08-03 DIAGNOSIS — Z992 Dependence on renal dialysis: Secondary | ICD-10-CM | POA: Diagnosis not present

## 2019-08-04 DIAGNOSIS — N2581 Secondary hyperparathyroidism of renal origin: Secondary | ICD-10-CM | POA: Diagnosis not present

## 2019-08-04 DIAGNOSIS — Z992 Dependence on renal dialysis: Secondary | ICD-10-CM | POA: Diagnosis not present

## 2019-08-04 DIAGNOSIS — D631 Anemia in chronic kidney disease: Secondary | ICD-10-CM | POA: Diagnosis not present

## 2019-08-04 DIAGNOSIS — N186 End stage renal disease: Secondary | ICD-10-CM | POA: Diagnosis not present

## 2019-08-04 DIAGNOSIS — D509 Iron deficiency anemia, unspecified: Secondary | ICD-10-CM | POA: Diagnosis not present

## 2019-08-06 DIAGNOSIS — N186 End stage renal disease: Secondary | ICD-10-CM | POA: Diagnosis not present

## 2019-08-06 DIAGNOSIS — D631 Anemia in chronic kidney disease: Secondary | ICD-10-CM | POA: Diagnosis not present

## 2019-08-06 DIAGNOSIS — N2581 Secondary hyperparathyroidism of renal origin: Secondary | ICD-10-CM | POA: Diagnosis not present

## 2019-08-06 DIAGNOSIS — Z992 Dependence on renal dialysis: Secondary | ICD-10-CM | POA: Diagnosis not present

## 2019-08-06 DIAGNOSIS — D509 Iron deficiency anemia, unspecified: Secondary | ICD-10-CM | POA: Diagnosis not present

## 2019-08-11 DIAGNOSIS — N186 End stage renal disease: Secondary | ICD-10-CM | POA: Diagnosis not present

## 2019-08-11 DIAGNOSIS — N2581 Secondary hyperparathyroidism of renal origin: Secondary | ICD-10-CM | POA: Diagnosis not present

## 2019-08-11 DIAGNOSIS — D631 Anemia in chronic kidney disease: Secondary | ICD-10-CM | POA: Diagnosis not present

## 2019-08-11 DIAGNOSIS — Z992 Dependence on renal dialysis: Secondary | ICD-10-CM | POA: Diagnosis not present

## 2019-08-11 DIAGNOSIS — D509 Iron deficiency anemia, unspecified: Secondary | ICD-10-CM | POA: Diagnosis not present

## 2019-08-11 DIAGNOSIS — Z23 Encounter for immunization: Secondary | ICD-10-CM | POA: Diagnosis not present

## 2019-08-13 DIAGNOSIS — D631 Anemia in chronic kidney disease: Secondary | ICD-10-CM | POA: Diagnosis not present

## 2019-08-13 DIAGNOSIS — Z992 Dependence on renal dialysis: Secondary | ICD-10-CM | POA: Diagnosis not present

## 2019-08-13 DIAGNOSIS — N186 End stage renal disease: Secondary | ICD-10-CM | POA: Diagnosis not present

## 2019-08-13 DIAGNOSIS — D509 Iron deficiency anemia, unspecified: Secondary | ICD-10-CM | POA: Diagnosis not present

## 2019-08-13 DIAGNOSIS — N2581 Secondary hyperparathyroidism of renal origin: Secondary | ICD-10-CM | POA: Diagnosis not present

## 2019-08-15 DIAGNOSIS — N2581 Secondary hyperparathyroidism of renal origin: Secondary | ICD-10-CM | POA: Diagnosis not present

## 2019-08-15 DIAGNOSIS — Z992 Dependence on renal dialysis: Secondary | ICD-10-CM | POA: Diagnosis not present

## 2019-08-15 DIAGNOSIS — D509 Iron deficiency anemia, unspecified: Secondary | ICD-10-CM | POA: Diagnosis not present

## 2019-08-15 DIAGNOSIS — D631 Anemia in chronic kidney disease: Secondary | ICD-10-CM | POA: Diagnosis not present

## 2019-08-15 DIAGNOSIS — N186 End stage renal disease: Secondary | ICD-10-CM | POA: Diagnosis not present

## 2019-08-18 DIAGNOSIS — N2581 Secondary hyperparathyroidism of renal origin: Secondary | ICD-10-CM | POA: Diagnosis not present

## 2019-08-18 DIAGNOSIS — D631 Anemia in chronic kidney disease: Secondary | ICD-10-CM | POA: Diagnosis not present

## 2019-08-18 DIAGNOSIS — N186 End stage renal disease: Secondary | ICD-10-CM | POA: Diagnosis not present

## 2019-08-18 DIAGNOSIS — Z992 Dependence on renal dialysis: Secondary | ICD-10-CM | POA: Diagnosis not present

## 2019-08-18 DIAGNOSIS — D509 Iron deficiency anemia, unspecified: Secondary | ICD-10-CM | POA: Diagnosis not present

## 2019-08-22 DIAGNOSIS — N186 End stage renal disease: Secondary | ICD-10-CM | POA: Diagnosis not present

## 2019-08-22 DIAGNOSIS — D509 Iron deficiency anemia, unspecified: Secondary | ICD-10-CM | POA: Diagnosis not present

## 2019-08-22 DIAGNOSIS — N2581 Secondary hyperparathyroidism of renal origin: Secondary | ICD-10-CM | POA: Diagnosis not present

## 2019-08-22 DIAGNOSIS — D631 Anemia in chronic kidney disease: Secondary | ICD-10-CM | POA: Diagnosis not present

## 2019-08-22 DIAGNOSIS — Z992 Dependence on renal dialysis: Secondary | ICD-10-CM | POA: Diagnosis not present

## 2019-08-27 DIAGNOSIS — D509 Iron deficiency anemia, unspecified: Secondary | ICD-10-CM | POA: Diagnosis not present

## 2019-08-27 DIAGNOSIS — D631 Anemia in chronic kidney disease: Secondary | ICD-10-CM | POA: Diagnosis not present

## 2019-08-27 DIAGNOSIS — N2581 Secondary hyperparathyroidism of renal origin: Secondary | ICD-10-CM | POA: Diagnosis not present

## 2019-08-27 DIAGNOSIS — Z794 Long term (current) use of insulin: Secondary | ICD-10-CM | POA: Diagnosis not present

## 2019-08-27 DIAGNOSIS — E119 Type 2 diabetes mellitus without complications: Secondary | ICD-10-CM | POA: Diagnosis not present

## 2019-08-27 DIAGNOSIS — Z79899 Other long term (current) drug therapy: Secondary | ICD-10-CM | POA: Diagnosis not present

## 2019-08-27 DIAGNOSIS — E785 Hyperlipidemia, unspecified: Secondary | ICD-10-CM | POA: Diagnosis not present

## 2019-08-27 DIAGNOSIS — Z5181 Encounter for therapeutic drug level monitoring: Secondary | ICD-10-CM | POA: Diagnosis not present

## 2019-08-27 DIAGNOSIS — Z992 Dependence on renal dialysis: Secondary | ICD-10-CM | POA: Diagnosis not present

## 2019-08-27 DIAGNOSIS — N186 End stage renal disease: Secondary | ICD-10-CM | POA: Diagnosis not present

## 2019-08-29 DIAGNOSIS — D631 Anemia in chronic kidney disease: Secondary | ICD-10-CM | POA: Diagnosis not present

## 2019-08-29 DIAGNOSIS — Z23 Encounter for immunization: Secondary | ICD-10-CM | POA: Diagnosis not present

## 2019-08-29 DIAGNOSIS — Z992 Dependence on renal dialysis: Secondary | ICD-10-CM | POA: Diagnosis not present

## 2019-08-29 DIAGNOSIS — N186 End stage renal disease: Secondary | ICD-10-CM | POA: Diagnosis not present

## 2019-08-29 DIAGNOSIS — D509 Iron deficiency anemia, unspecified: Secondary | ICD-10-CM | POA: Diagnosis not present

## 2019-08-29 DIAGNOSIS — N2581 Secondary hyperparathyroidism of renal origin: Secondary | ICD-10-CM | POA: Diagnosis not present

## 2019-09-01 DIAGNOSIS — Z992 Dependence on renal dialysis: Secondary | ICD-10-CM | POA: Diagnosis not present

## 2019-09-01 DIAGNOSIS — N2581 Secondary hyperparathyroidism of renal origin: Secondary | ICD-10-CM | POA: Diagnosis not present

## 2019-09-01 DIAGNOSIS — N186 End stage renal disease: Secondary | ICD-10-CM | POA: Diagnosis not present

## 2019-09-01 DIAGNOSIS — D631 Anemia in chronic kidney disease: Secondary | ICD-10-CM | POA: Diagnosis not present

## 2019-09-01 DIAGNOSIS — D509 Iron deficiency anemia, unspecified: Secondary | ICD-10-CM | POA: Diagnosis not present

## 2019-09-03 DIAGNOSIS — N2581 Secondary hyperparathyroidism of renal origin: Secondary | ICD-10-CM | POA: Diagnosis not present

## 2019-09-03 DIAGNOSIS — D631 Anemia in chronic kidney disease: Secondary | ICD-10-CM | POA: Diagnosis not present

## 2019-09-03 DIAGNOSIS — N186 End stage renal disease: Secondary | ICD-10-CM | POA: Diagnosis not present

## 2019-09-03 DIAGNOSIS — D509 Iron deficiency anemia, unspecified: Secondary | ICD-10-CM | POA: Diagnosis not present

## 2019-09-03 DIAGNOSIS — Z992 Dependence on renal dialysis: Secondary | ICD-10-CM | POA: Diagnosis not present

## 2019-09-05 DIAGNOSIS — N2581 Secondary hyperparathyroidism of renal origin: Secondary | ICD-10-CM | POA: Diagnosis not present

## 2019-09-05 DIAGNOSIS — D509 Iron deficiency anemia, unspecified: Secondary | ICD-10-CM | POA: Diagnosis not present

## 2019-09-05 DIAGNOSIS — Z23 Encounter for immunization: Secondary | ICD-10-CM | POA: Diagnosis not present

## 2019-09-05 DIAGNOSIS — Z992 Dependence on renal dialysis: Secondary | ICD-10-CM | POA: Diagnosis not present

## 2019-09-05 DIAGNOSIS — D631 Anemia in chronic kidney disease: Secondary | ICD-10-CM | POA: Diagnosis not present

## 2019-09-05 DIAGNOSIS — N186 End stage renal disease: Secondary | ICD-10-CM | POA: Diagnosis not present

## 2019-09-10 DIAGNOSIS — N2581 Secondary hyperparathyroidism of renal origin: Secondary | ICD-10-CM | POA: Diagnosis not present

## 2019-09-10 DIAGNOSIS — Z23 Encounter for immunization: Secondary | ICD-10-CM | POA: Diagnosis not present

## 2019-09-10 DIAGNOSIS — Z992 Dependence on renal dialysis: Secondary | ICD-10-CM | POA: Diagnosis not present

## 2019-09-10 DIAGNOSIS — D509 Iron deficiency anemia, unspecified: Secondary | ICD-10-CM | POA: Diagnosis not present

## 2019-09-10 DIAGNOSIS — D631 Anemia in chronic kidney disease: Secondary | ICD-10-CM | POA: Diagnosis not present

## 2019-09-10 DIAGNOSIS — N186 End stage renal disease: Secondary | ICD-10-CM | POA: Diagnosis not present

## 2019-09-12 DIAGNOSIS — Z23 Encounter for immunization: Secondary | ICD-10-CM | POA: Diagnosis not present

## 2019-09-12 DIAGNOSIS — D509 Iron deficiency anemia, unspecified: Secondary | ICD-10-CM | POA: Diagnosis not present

## 2019-09-12 DIAGNOSIS — D631 Anemia in chronic kidney disease: Secondary | ICD-10-CM | POA: Diagnosis not present

## 2019-09-12 DIAGNOSIS — N2581 Secondary hyperparathyroidism of renal origin: Secondary | ICD-10-CM | POA: Diagnosis not present

## 2019-09-12 DIAGNOSIS — Z992 Dependence on renal dialysis: Secondary | ICD-10-CM | POA: Diagnosis not present

## 2019-09-12 DIAGNOSIS — N186 End stage renal disease: Secondary | ICD-10-CM | POA: Diagnosis not present

## 2019-09-15 DIAGNOSIS — D631 Anemia in chronic kidney disease: Secondary | ICD-10-CM | POA: Diagnosis not present

## 2019-09-15 DIAGNOSIS — Z23 Encounter for immunization: Secondary | ICD-10-CM | POA: Diagnosis not present

## 2019-09-15 DIAGNOSIS — D509 Iron deficiency anemia, unspecified: Secondary | ICD-10-CM | POA: Diagnosis not present

## 2019-09-15 DIAGNOSIS — Z992 Dependence on renal dialysis: Secondary | ICD-10-CM | POA: Diagnosis not present

## 2019-09-15 DIAGNOSIS — N186 End stage renal disease: Secondary | ICD-10-CM | POA: Diagnosis not present

## 2019-09-15 DIAGNOSIS — N2581 Secondary hyperparathyroidism of renal origin: Secondary | ICD-10-CM | POA: Diagnosis not present

## 2019-09-17 DIAGNOSIS — D631 Anemia in chronic kidney disease: Secondary | ICD-10-CM | POA: Diagnosis not present

## 2019-09-17 DIAGNOSIS — N186 End stage renal disease: Secondary | ICD-10-CM | POA: Diagnosis not present

## 2019-09-17 DIAGNOSIS — N2581 Secondary hyperparathyroidism of renal origin: Secondary | ICD-10-CM | POA: Diagnosis not present

## 2019-09-17 DIAGNOSIS — Z23 Encounter for immunization: Secondary | ICD-10-CM | POA: Diagnosis not present

## 2019-09-17 DIAGNOSIS — Z992 Dependence on renal dialysis: Secondary | ICD-10-CM | POA: Diagnosis not present

## 2019-09-17 DIAGNOSIS — D509 Iron deficiency anemia, unspecified: Secondary | ICD-10-CM | POA: Diagnosis not present

## 2019-09-19 DIAGNOSIS — D509 Iron deficiency anemia, unspecified: Secondary | ICD-10-CM | POA: Diagnosis not present

## 2019-09-19 DIAGNOSIS — N2581 Secondary hyperparathyroidism of renal origin: Secondary | ICD-10-CM | POA: Diagnosis not present

## 2019-09-19 DIAGNOSIS — Z23 Encounter for immunization: Secondary | ICD-10-CM | POA: Diagnosis not present

## 2019-09-19 DIAGNOSIS — D631 Anemia in chronic kidney disease: Secondary | ICD-10-CM | POA: Diagnosis not present

## 2019-09-19 DIAGNOSIS — N186 End stage renal disease: Secondary | ICD-10-CM | POA: Diagnosis not present

## 2019-09-19 DIAGNOSIS — Z992 Dependence on renal dialysis: Secondary | ICD-10-CM | POA: Diagnosis not present

## 2019-09-22 DIAGNOSIS — D631 Anemia in chronic kidney disease: Secondary | ICD-10-CM | POA: Diagnosis not present

## 2019-09-22 DIAGNOSIS — Z23 Encounter for immunization: Secondary | ICD-10-CM | POA: Diagnosis not present

## 2019-09-22 DIAGNOSIS — N2581 Secondary hyperparathyroidism of renal origin: Secondary | ICD-10-CM | POA: Diagnosis not present

## 2019-09-22 DIAGNOSIS — Z992 Dependence on renal dialysis: Secondary | ICD-10-CM | POA: Diagnosis not present

## 2019-09-22 DIAGNOSIS — D509 Iron deficiency anemia, unspecified: Secondary | ICD-10-CM | POA: Diagnosis not present

## 2019-09-22 DIAGNOSIS — N186 End stage renal disease: Secondary | ICD-10-CM | POA: Diagnosis not present

## 2019-09-24 DIAGNOSIS — Z992 Dependence on renal dialysis: Secondary | ICD-10-CM | POA: Diagnosis not present

## 2019-09-24 DIAGNOSIS — Z23 Encounter for immunization: Secondary | ICD-10-CM | POA: Diagnosis not present

## 2019-09-24 DIAGNOSIS — N2581 Secondary hyperparathyroidism of renal origin: Secondary | ICD-10-CM | POA: Diagnosis not present

## 2019-09-24 DIAGNOSIS — D631 Anemia in chronic kidney disease: Secondary | ICD-10-CM | POA: Diagnosis not present

## 2019-09-24 DIAGNOSIS — D509 Iron deficiency anemia, unspecified: Secondary | ICD-10-CM | POA: Diagnosis not present

## 2019-09-24 DIAGNOSIS — N186 End stage renal disease: Secondary | ICD-10-CM | POA: Diagnosis not present

## 2019-09-26 DIAGNOSIS — D509 Iron deficiency anemia, unspecified: Secondary | ICD-10-CM | POA: Diagnosis not present

## 2019-09-26 DIAGNOSIS — N186 End stage renal disease: Secondary | ICD-10-CM | POA: Diagnosis not present

## 2019-09-26 DIAGNOSIS — D631 Anemia in chronic kidney disease: Secondary | ICD-10-CM | POA: Diagnosis not present

## 2019-09-26 DIAGNOSIS — Z23 Encounter for immunization: Secondary | ICD-10-CM | POA: Diagnosis not present

## 2019-09-26 DIAGNOSIS — N2581 Secondary hyperparathyroidism of renal origin: Secondary | ICD-10-CM | POA: Diagnosis not present

## 2019-09-26 DIAGNOSIS — Z992 Dependence on renal dialysis: Secondary | ICD-10-CM | POA: Diagnosis not present

## 2019-09-29 DIAGNOSIS — Z992 Dependence on renal dialysis: Secondary | ICD-10-CM | POA: Diagnosis not present

## 2019-09-29 DIAGNOSIS — D509 Iron deficiency anemia, unspecified: Secondary | ICD-10-CM | POA: Diagnosis not present

## 2019-09-29 DIAGNOSIS — N2581 Secondary hyperparathyroidism of renal origin: Secondary | ICD-10-CM | POA: Diagnosis not present

## 2019-09-29 DIAGNOSIS — Z23 Encounter for immunization: Secondary | ICD-10-CM | POA: Diagnosis not present

## 2019-09-29 DIAGNOSIS — D631 Anemia in chronic kidney disease: Secondary | ICD-10-CM | POA: Diagnosis not present

## 2019-09-29 DIAGNOSIS — N186 End stage renal disease: Secondary | ICD-10-CM | POA: Diagnosis not present

## 2019-10-01 DIAGNOSIS — N2581 Secondary hyperparathyroidism of renal origin: Secondary | ICD-10-CM | POA: Diagnosis not present

## 2019-10-01 DIAGNOSIS — D631 Anemia in chronic kidney disease: Secondary | ICD-10-CM | POA: Diagnosis not present

## 2019-10-01 DIAGNOSIS — Z23 Encounter for immunization: Secondary | ICD-10-CM | POA: Diagnosis not present

## 2019-10-01 DIAGNOSIS — D509 Iron deficiency anemia, unspecified: Secondary | ICD-10-CM | POA: Diagnosis not present

## 2019-10-01 DIAGNOSIS — Z992 Dependence on renal dialysis: Secondary | ICD-10-CM | POA: Diagnosis not present

## 2019-10-01 DIAGNOSIS — N186 End stage renal disease: Secondary | ICD-10-CM | POA: Diagnosis not present

## 2019-10-03 DIAGNOSIS — Z992 Dependence on renal dialysis: Secondary | ICD-10-CM | POA: Diagnosis not present

## 2019-10-03 DIAGNOSIS — N186 End stage renal disease: Secondary | ICD-10-CM | POA: Diagnosis not present

## 2019-10-06 DIAGNOSIS — N186 End stage renal disease: Secondary | ICD-10-CM | POA: Diagnosis not present

## 2019-10-06 DIAGNOSIS — D631 Anemia in chronic kidney disease: Secondary | ICD-10-CM | POA: Diagnosis not present

## 2019-10-06 DIAGNOSIS — D509 Iron deficiency anemia, unspecified: Secondary | ICD-10-CM | POA: Diagnosis not present

## 2019-10-06 DIAGNOSIS — N2581 Secondary hyperparathyroidism of renal origin: Secondary | ICD-10-CM | POA: Diagnosis not present

## 2019-10-06 DIAGNOSIS — Z992 Dependence on renal dialysis: Secondary | ICD-10-CM | POA: Diagnosis not present

## 2019-10-08 DIAGNOSIS — D631 Anemia in chronic kidney disease: Secondary | ICD-10-CM | POA: Diagnosis not present

## 2019-10-08 DIAGNOSIS — Z992 Dependence on renal dialysis: Secondary | ICD-10-CM | POA: Diagnosis not present

## 2019-10-08 DIAGNOSIS — N2581 Secondary hyperparathyroidism of renal origin: Secondary | ICD-10-CM | POA: Diagnosis not present

## 2019-10-08 DIAGNOSIS — N186 End stage renal disease: Secondary | ICD-10-CM | POA: Diagnosis not present

## 2019-10-08 DIAGNOSIS — D509 Iron deficiency anemia, unspecified: Secondary | ICD-10-CM | POA: Diagnosis not present

## 2019-10-10 DIAGNOSIS — D509 Iron deficiency anemia, unspecified: Secondary | ICD-10-CM | POA: Diagnosis not present

## 2019-10-10 DIAGNOSIS — Z992 Dependence on renal dialysis: Secondary | ICD-10-CM | POA: Diagnosis not present

## 2019-10-10 DIAGNOSIS — N2581 Secondary hyperparathyroidism of renal origin: Secondary | ICD-10-CM | POA: Diagnosis not present

## 2019-10-10 DIAGNOSIS — N186 End stage renal disease: Secondary | ICD-10-CM | POA: Diagnosis not present

## 2019-10-10 DIAGNOSIS — D631 Anemia in chronic kidney disease: Secondary | ICD-10-CM | POA: Diagnosis not present

## 2019-10-13 DIAGNOSIS — N186 End stage renal disease: Secondary | ICD-10-CM | POA: Diagnosis not present

## 2019-10-13 DIAGNOSIS — N2581 Secondary hyperparathyroidism of renal origin: Secondary | ICD-10-CM | POA: Diagnosis not present

## 2019-10-13 DIAGNOSIS — D509 Iron deficiency anemia, unspecified: Secondary | ICD-10-CM | POA: Diagnosis not present

## 2019-10-13 DIAGNOSIS — D631 Anemia in chronic kidney disease: Secondary | ICD-10-CM | POA: Diagnosis not present

## 2019-10-13 DIAGNOSIS — Z992 Dependence on renal dialysis: Secondary | ICD-10-CM | POA: Diagnosis not present

## 2019-10-15 DIAGNOSIS — N2581 Secondary hyperparathyroidism of renal origin: Secondary | ICD-10-CM | POA: Diagnosis not present

## 2019-10-15 DIAGNOSIS — D509 Iron deficiency anemia, unspecified: Secondary | ICD-10-CM | POA: Diagnosis not present

## 2019-10-15 DIAGNOSIS — N186 End stage renal disease: Secondary | ICD-10-CM | POA: Diagnosis not present

## 2019-10-15 DIAGNOSIS — Z992 Dependence on renal dialysis: Secondary | ICD-10-CM | POA: Diagnosis not present

## 2019-10-15 DIAGNOSIS — D631 Anemia in chronic kidney disease: Secondary | ICD-10-CM | POA: Diagnosis not present

## 2019-10-17 DIAGNOSIS — N2581 Secondary hyperparathyroidism of renal origin: Secondary | ICD-10-CM | POA: Diagnosis not present

## 2019-10-17 DIAGNOSIS — N186 End stage renal disease: Secondary | ICD-10-CM | POA: Diagnosis not present

## 2019-10-17 DIAGNOSIS — D631 Anemia in chronic kidney disease: Secondary | ICD-10-CM | POA: Diagnosis not present

## 2019-10-17 DIAGNOSIS — Z992 Dependence on renal dialysis: Secondary | ICD-10-CM | POA: Diagnosis not present

## 2019-10-17 DIAGNOSIS — D509 Iron deficiency anemia, unspecified: Secondary | ICD-10-CM | POA: Diagnosis not present

## 2019-10-20 DIAGNOSIS — N186 End stage renal disease: Secondary | ICD-10-CM | POA: Diagnosis not present

## 2019-10-20 DIAGNOSIS — D631 Anemia in chronic kidney disease: Secondary | ICD-10-CM | POA: Diagnosis not present

## 2019-10-20 DIAGNOSIS — D509 Iron deficiency anemia, unspecified: Secondary | ICD-10-CM | POA: Diagnosis not present

## 2019-10-20 DIAGNOSIS — Z992 Dependence on renal dialysis: Secondary | ICD-10-CM | POA: Diagnosis not present

## 2019-10-20 DIAGNOSIS — N2581 Secondary hyperparathyroidism of renal origin: Secondary | ICD-10-CM | POA: Diagnosis not present

## 2019-10-22 DIAGNOSIS — N186 End stage renal disease: Secondary | ICD-10-CM | POA: Diagnosis not present

## 2019-10-22 DIAGNOSIS — Z992 Dependence on renal dialysis: Secondary | ICD-10-CM | POA: Diagnosis not present

## 2019-10-22 DIAGNOSIS — D631 Anemia in chronic kidney disease: Secondary | ICD-10-CM | POA: Diagnosis not present

## 2019-10-22 DIAGNOSIS — D509 Iron deficiency anemia, unspecified: Secondary | ICD-10-CM | POA: Diagnosis not present

## 2019-10-22 DIAGNOSIS — N2581 Secondary hyperparathyroidism of renal origin: Secondary | ICD-10-CM | POA: Diagnosis not present

## 2019-10-27 DIAGNOSIS — Z992 Dependence on renal dialysis: Secondary | ICD-10-CM | POA: Diagnosis not present

## 2019-10-27 DIAGNOSIS — N2581 Secondary hyperparathyroidism of renal origin: Secondary | ICD-10-CM | POA: Diagnosis not present

## 2019-10-27 DIAGNOSIS — D509 Iron deficiency anemia, unspecified: Secondary | ICD-10-CM | POA: Diagnosis not present

## 2019-10-27 DIAGNOSIS — D631 Anemia in chronic kidney disease: Secondary | ICD-10-CM | POA: Diagnosis not present

## 2019-10-27 DIAGNOSIS — N186 End stage renal disease: Secondary | ICD-10-CM | POA: Diagnosis not present

## 2019-10-29 DIAGNOSIS — Z992 Dependence on renal dialysis: Secondary | ICD-10-CM | POA: Diagnosis not present

## 2019-10-29 DIAGNOSIS — D631 Anemia in chronic kidney disease: Secondary | ICD-10-CM | POA: Diagnosis not present

## 2019-10-29 DIAGNOSIS — N2581 Secondary hyperparathyroidism of renal origin: Secondary | ICD-10-CM | POA: Diagnosis not present

## 2019-10-29 DIAGNOSIS — N186 End stage renal disease: Secondary | ICD-10-CM | POA: Diagnosis not present

## 2019-10-29 DIAGNOSIS — D509 Iron deficiency anemia, unspecified: Secondary | ICD-10-CM | POA: Diagnosis not present

## 2019-10-31 DIAGNOSIS — Z992 Dependence on renal dialysis: Secondary | ICD-10-CM | POA: Diagnosis not present

## 2019-10-31 DIAGNOSIS — D631 Anemia in chronic kidney disease: Secondary | ICD-10-CM | POA: Diagnosis not present

## 2019-10-31 DIAGNOSIS — N2581 Secondary hyperparathyroidism of renal origin: Secondary | ICD-10-CM | POA: Diagnosis not present

## 2019-10-31 DIAGNOSIS — D509 Iron deficiency anemia, unspecified: Secondary | ICD-10-CM | POA: Diagnosis not present

## 2019-10-31 DIAGNOSIS — N186 End stage renal disease: Secondary | ICD-10-CM | POA: Diagnosis not present

## 2019-11-03 DIAGNOSIS — Z992 Dependence on renal dialysis: Secondary | ICD-10-CM | POA: Diagnosis not present

## 2019-11-03 DIAGNOSIS — N186 End stage renal disease: Secondary | ICD-10-CM | POA: Diagnosis not present

## 2019-11-05 DIAGNOSIS — N186 End stage renal disease: Secondary | ICD-10-CM | POA: Diagnosis not present

## 2019-11-05 DIAGNOSIS — D509 Iron deficiency anemia, unspecified: Secondary | ICD-10-CM | POA: Diagnosis not present

## 2019-11-05 DIAGNOSIS — D631 Anemia in chronic kidney disease: Secondary | ICD-10-CM | POA: Diagnosis not present

## 2019-11-05 DIAGNOSIS — N2581 Secondary hyperparathyroidism of renal origin: Secondary | ICD-10-CM | POA: Diagnosis not present

## 2019-11-05 DIAGNOSIS — Z992 Dependence on renal dialysis: Secondary | ICD-10-CM | POA: Diagnosis not present

## 2019-11-07 DIAGNOSIS — Z992 Dependence on renal dialysis: Secondary | ICD-10-CM | POA: Diagnosis not present

## 2019-11-07 DIAGNOSIS — N2581 Secondary hyperparathyroidism of renal origin: Secondary | ICD-10-CM | POA: Diagnosis not present

## 2019-11-07 DIAGNOSIS — N186 End stage renal disease: Secondary | ICD-10-CM | POA: Diagnosis not present

## 2019-11-07 DIAGNOSIS — D631 Anemia in chronic kidney disease: Secondary | ICD-10-CM | POA: Diagnosis not present

## 2019-11-07 DIAGNOSIS — D509 Iron deficiency anemia, unspecified: Secondary | ICD-10-CM | POA: Diagnosis not present

## 2019-11-10 DIAGNOSIS — D631 Anemia in chronic kidney disease: Secondary | ICD-10-CM | POA: Diagnosis not present

## 2019-11-10 DIAGNOSIS — N2581 Secondary hyperparathyroidism of renal origin: Secondary | ICD-10-CM | POA: Diagnosis not present

## 2019-11-10 DIAGNOSIS — Z992 Dependence on renal dialysis: Secondary | ICD-10-CM | POA: Diagnosis not present

## 2019-11-10 DIAGNOSIS — N186 End stage renal disease: Secondary | ICD-10-CM | POA: Diagnosis not present

## 2019-11-10 DIAGNOSIS — D509 Iron deficiency anemia, unspecified: Secondary | ICD-10-CM | POA: Diagnosis not present

## 2019-11-12 DIAGNOSIS — N2581 Secondary hyperparathyroidism of renal origin: Secondary | ICD-10-CM | POA: Diagnosis not present

## 2019-11-12 DIAGNOSIS — D631 Anemia in chronic kidney disease: Secondary | ICD-10-CM | POA: Diagnosis not present

## 2019-11-12 DIAGNOSIS — N186 End stage renal disease: Secondary | ICD-10-CM | POA: Diagnosis not present

## 2019-11-12 DIAGNOSIS — D509 Iron deficiency anemia, unspecified: Secondary | ICD-10-CM | POA: Diagnosis not present

## 2019-11-12 DIAGNOSIS — Z992 Dependence on renal dialysis: Secondary | ICD-10-CM | POA: Diagnosis not present

## 2019-11-14 DIAGNOSIS — D631 Anemia in chronic kidney disease: Secondary | ICD-10-CM | POA: Diagnosis not present

## 2019-11-14 DIAGNOSIS — D509 Iron deficiency anemia, unspecified: Secondary | ICD-10-CM | POA: Diagnosis not present

## 2019-11-14 DIAGNOSIS — N2581 Secondary hyperparathyroidism of renal origin: Secondary | ICD-10-CM | POA: Diagnosis not present

## 2019-11-14 DIAGNOSIS — Z992 Dependence on renal dialysis: Secondary | ICD-10-CM | POA: Diagnosis not present

## 2019-11-14 DIAGNOSIS — N186 End stage renal disease: Secondary | ICD-10-CM | POA: Diagnosis not present

## 2019-11-17 DIAGNOSIS — N2581 Secondary hyperparathyroidism of renal origin: Secondary | ICD-10-CM | POA: Diagnosis not present

## 2019-11-17 DIAGNOSIS — N186 End stage renal disease: Secondary | ICD-10-CM | POA: Diagnosis not present

## 2019-11-17 DIAGNOSIS — Z992 Dependence on renal dialysis: Secondary | ICD-10-CM | POA: Diagnosis not present

## 2019-11-17 DIAGNOSIS — D631 Anemia in chronic kidney disease: Secondary | ICD-10-CM | POA: Diagnosis not present

## 2019-11-17 DIAGNOSIS — D509 Iron deficiency anemia, unspecified: Secondary | ICD-10-CM | POA: Diagnosis not present

## 2019-11-19 DIAGNOSIS — N186 End stage renal disease: Secondary | ICD-10-CM | POA: Diagnosis not present

## 2019-11-19 DIAGNOSIS — D509 Iron deficiency anemia, unspecified: Secondary | ICD-10-CM | POA: Diagnosis not present

## 2019-11-19 DIAGNOSIS — D631 Anemia in chronic kidney disease: Secondary | ICD-10-CM | POA: Diagnosis not present

## 2019-11-19 DIAGNOSIS — Z992 Dependence on renal dialysis: Secondary | ICD-10-CM | POA: Diagnosis not present

## 2019-11-19 DIAGNOSIS — N2581 Secondary hyperparathyroidism of renal origin: Secondary | ICD-10-CM | POA: Diagnosis not present

## 2019-11-21 DIAGNOSIS — D509 Iron deficiency anemia, unspecified: Secondary | ICD-10-CM | POA: Diagnosis not present

## 2019-11-21 DIAGNOSIS — N186 End stage renal disease: Secondary | ICD-10-CM | POA: Diagnosis not present

## 2019-11-21 DIAGNOSIS — N2581 Secondary hyperparathyroidism of renal origin: Secondary | ICD-10-CM | POA: Diagnosis not present

## 2019-11-21 DIAGNOSIS — D631 Anemia in chronic kidney disease: Secondary | ICD-10-CM | POA: Diagnosis not present

## 2019-11-21 DIAGNOSIS — Z992 Dependence on renal dialysis: Secondary | ICD-10-CM | POA: Diagnosis not present

## 2019-11-24 DIAGNOSIS — D509 Iron deficiency anemia, unspecified: Secondary | ICD-10-CM | POA: Diagnosis not present

## 2019-11-24 DIAGNOSIS — N2581 Secondary hyperparathyroidism of renal origin: Secondary | ICD-10-CM | POA: Diagnosis not present

## 2019-11-24 DIAGNOSIS — Z992 Dependence on renal dialysis: Secondary | ICD-10-CM | POA: Diagnosis not present

## 2019-11-24 DIAGNOSIS — N186 End stage renal disease: Secondary | ICD-10-CM | POA: Diagnosis not present

## 2019-11-24 DIAGNOSIS — D631 Anemia in chronic kidney disease: Secondary | ICD-10-CM | POA: Diagnosis not present

## 2019-11-26 DIAGNOSIS — N2581 Secondary hyperparathyroidism of renal origin: Secondary | ICD-10-CM | POA: Diagnosis not present

## 2019-11-26 DIAGNOSIS — N186 End stage renal disease: Secondary | ICD-10-CM | POA: Diagnosis not present

## 2019-11-26 DIAGNOSIS — D631 Anemia in chronic kidney disease: Secondary | ICD-10-CM | POA: Diagnosis not present

## 2019-11-26 DIAGNOSIS — Z992 Dependence on renal dialysis: Secondary | ICD-10-CM | POA: Diagnosis not present

## 2019-11-26 DIAGNOSIS — D509 Iron deficiency anemia, unspecified: Secondary | ICD-10-CM | POA: Diagnosis not present

## 2019-11-28 DIAGNOSIS — N186 End stage renal disease: Secondary | ICD-10-CM | POA: Diagnosis not present

## 2019-11-28 DIAGNOSIS — N2581 Secondary hyperparathyroidism of renal origin: Secondary | ICD-10-CM | POA: Diagnosis not present

## 2019-11-28 DIAGNOSIS — Z992 Dependence on renal dialysis: Secondary | ICD-10-CM | POA: Diagnosis not present

## 2019-11-28 DIAGNOSIS — D509 Iron deficiency anemia, unspecified: Secondary | ICD-10-CM | POA: Diagnosis not present

## 2019-11-28 DIAGNOSIS — D631 Anemia in chronic kidney disease: Secondary | ICD-10-CM | POA: Diagnosis not present

## 2019-12-01 DIAGNOSIS — D509 Iron deficiency anemia, unspecified: Secondary | ICD-10-CM | POA: Diagnosis not present

## 2019-12-01 DIAGNOSIS — N186 End stage renal disease: Secondary | ICD-10-CM | POA: Diagnosis not present

## 2019-12-01 DIAGNOSIS — N2581 Secondary hyperparathyroidism of renal origin: Secondary | ICD-10-CM | POA: Diagnosis not present

## 2019-12-01 DIAGNOSIS — Z992 Dependence on renal dialysis: Secondary | ICD-10-CM | POA: Diagnosis not present

## 2019-12-01 DIAGNOSIS — D631 Anemia in chronic kidney disease: Secondary | ICD-10-CM | POA: Diagnosis not present

## 2019-12-03 DIAGNOSIS — N186 End stage renal disease: Secondary | ICD-10-CM | POA: Diagnosis not present

## 2019-12-03 DIAGNOSIS — D631 Anemia in chronic kidney disease: Secondary | ICD-10-CM | POA: Diagnosis not present

## 2019-12-03 DIAGNOSIS — N2581 Secondary hyperparathyroidism of renal origin: Secondary | ICD-10-CM | POA: Diagnosis not present

## 2019-12-03 DIAGNOSIS — Z992 Dependence on renal dialysis: Secondary | ICD-10-CM | POA: Diagnosis not present

## 2019-12-03 DIAGNOSIS — D509 Iron deficiency anemia, unspecified: Secondary | ICD-10-CM | POA: Diagnosis not present

## 2019-12-09 DIAGNOSIS — D631 Anemia in chronic kidney disease: Secondary | ICD-10-CM | POA: Diagnosis not present

## 2019-12-09 DIAGNOSIS — D509 Iron deficiency anemia, unspecified: Secondary | ICD-10-CM | POA: Diagnosis not present

## 2019-12-09 DIAGNOSIS — Z992 Dependence on renal dialysis: Secondary | ICD-10-CM | POA: Diagnosis not present

## 2019-12-09 DIAGNOSIS — N2581 Secondary hyperparathyroidism of renal origin: Secondary | ICD-10-CM | POA: Diagnosis not present

## 2019-12-09 DIAGNOSIS — R110 Nausea: Secondary | ICD-10-CM | POA: Diagnosis not present

## 2019-12-09 DIAGNOSIS — N186 End stage renal disease: Secondary | ICD-10-CM | POA: Diagnosis not present

## 2019-12-10 DIAGNOSIS — N186 End stage renal disease: Secondary | ICD-10-CM | POA: Diagnosis not present

## 2019-12-10 DIAGNOSIS — Z992 Dependence on renal dialysis: Secondary | ICD-10-CM | POA: Diagnosis not present

## 2019-12-10 DIAGNOSIS — D631 Anemia in chronic kidney disease: Secondary | ICD-10-CM | POA: Diagnosis not present

## 2019-12-10 DIAGNOSIS — D509 Iron deficiency anemia, unspecified: Secondary | ICD-10-CM | POA: Diagnosis not present

## 2019-12-10 DIAGNOSIS — R110 Nausea: Secondary | ICD-10-CM | POA: Diagnosis not present

## 2019-12-10 DIAGNOSIS — N2581 Secondary hyperparathyroidism of renal origin: Secondary | ICD-10-CM | POA: Diagnosis not present

## 2019-12-12 DIAGNOSIS — D631 Anemia in chronic kidney disease: Secondary | ICD-10-CM | POA: Diagnosis not present

## 2019-12-12 DIAGNOSIS — R110 Nausea: Secondary | ICD-10-CM | POA: Diagnosis not present

## 2019-12-12 DIAGNOSIS — D509 Iron deficiency anemia, unspecified: Secondary | ICD-10-CM | POA: Diagnosis not present

## 2019-12-12 DIAGNOSIS — Z992 Dependence on renal dialysis: Secondary | ICD-10-CM | POA: Diagnosis not present

## 2019-12-12 DIAGNOSIS — N2581 Secondary hyperparathyroidism of renal origin: Secondary | ICD-10-CM | POA: Diagnosis not present

## 2019-12-12 DIAGNOSIS — N186 End stage renal disease: Secondary | ICD-10-CM | POA: Diagnosis not present

## 2019-12-15 DIAGNOSIS — D631 Anemia in chronic kidney disease: Secondary | ICD-10-CM | POA: Diagnosis not present

## 2019-12-15 DIAGNOSIS — Z992 Dependence on renal dialysis: Secondary | ICD-10-CM | POA: Diagnosis not present

## 2019-12-15 DIAGNOSIS — N186 End stage renal disease: Secondary | ICD-10-CM | POA: Diagnosis not present

## 2019-12-15 DIAGNOSIS — R110 Nausea: Secondary | ICD-10-CM | POA: Diagnosis not present

## 2019-12-15 DIAGNOSIS — N2581 Secondary hyperparathyroidism of renal origin: Secondary | ICD-10-CM | POA: Diagnosis not present

## 2019-12-15 DIAGNOSIS — D509 Iron deficiency anemia, unspecified: Secondary | ICD-10-CM | POA: Diagnosis not present

## 2019-12-16 DIAGNOSIS — J329 Chronic sinusitis, unspecified: Secondary | ICD-10-CM | POA: Diagnosis not present

## 2019-12-16 DIAGNOSIS — E1122 Type 2 diabetes mellitus with diabetic chronic kidney disease: Secondary | ICD-10-CM | POA: Diagnosis not present

## 2019-12-16 DIAGNOSIS — Z299 Encounter for prophylactic measures, unspecified: Secondary | ICD-10-CM | POA: Diagnosis not present

## 2019-12-16 DIAGNOSIS — Z789 Other specified health status: Secondary | ICD-10-CM | POA: Diagnosis not present

## 2019-12-16 DIAGNOSIS — I10 Essential (primary) hypertension: Secondary | ICD-10-CM | POA: Diagnosis not present

## 2019-12-19 DIAGNOSIS — D631 Anemia in chronic kidney disease: Secondary | ICD-10-CM | POA: Diagnosis not present

## 2019-12-19 DIAGNOSIS — N2581 Secondary hyperparathyroidism of renal origin: Secondary | ICD-10-CM | POA: Diagnosis not present

## 2019-12-19 DIAGNOSIS — D509 Iron deficiency anemia, unspecified: Secondary | ICD-10-CM | POA: Diagnosis not present

## 2019-12-19 DIAGNOSIS — Z992 Dependence on renal dialysis: Secondary | ICD-10-CM | POA: Diagnosis not present

## 2019-12-19 DIAGNOSIS — R110 Nausea: Secondary | ICD-10-CM | POA: Diagnosis not present

## 2019-12-19 DIAGNOSIS — N186 End stage renal disease: Secondary | ICD-10-CM | POA: Diagnosis not present

## 2019-12-22 DIAGNOSIS — D509 Iron deficiency anemia, unspecified: Secondary | ICD-10-CM | POA: Diagnosis not present

## 2019-12-22 DIAGNOSIS — D631 Anemia in chronic kidney disease: Secondary | ICD-10-CM | POA: Diagnosis not present

## 2019-12-22 DIAGNOSIS — N2581 Secondary hyperparathyroidism of renal origin: Secondary | ICD-10-CM | POA: Diagnosis not present

## 2019-12-22 DIAGNOSIS — N186 End stage renal disease: Secondary | ICD-10-CM | POA: Diagnosis not present

## 2019-12-22 DIAGNOSIS — R110 Nausea: Secondary | ICD-10-CM | POA: Diagnosis not present

## 2019-12-22 DIAGNOSIS — Z992 Dependence on renal dialysis: Secondary | ICD-10-CM | POA: Diagnosis not present

## 2019-12-24 DIAGNOSIS — Z992 Dependence on renal dialysis: Secondary | ICD-10-CM | POA: Diagnosis not present

## 2019-12-24 DIAGNOSIS — N2581 Secondary hyperparathyroidism of renal origin: Secondary | ICD-10-CM | POA: Diagnosis not present

## 2019-12-24 DIAGNOSIS — N186 End stage renal disease: Secondary | ICD-10-CM | POA: Diagnosis not present

## 2019-12-24 DIAGNOSIS — D509 Iron deficiency anemia, unspecified: Secondary | ICD-10-CM | POA: Diagnosis not present

## 2019-12-24 DIAGNOSIS — R110 Nausea: Secondary | ICD-10-CM | POA: Diagnosis not present

## 2019-12-24 DIAGNOSIS — D631 Anemia in chronic kidney disease: Secondary | ICD-10-CM | POA: Diagnosis not present

## 2019-12-26 DIAGNOSIS — D631 Anemia in chronic kidney disease: Secondary | ICD-10-CM | POA: Diagnosis not present

## 2019-12-26 DIAGNOSIS — Z992 Dependence on renal dialysis: Secondary | ICD-10-CM | POA: Diagnosis not present

## 2019-12-26 DIAGNOSIS — R110 Nausea: Secondary | ICD-10-CM | POA: Diagnosis not present

## 2019-12-26 DIAGNOSIS — N186 End stage renal disease: Secondary | ICD-10-CM | POA: Diagnosis not present

## 2019-12-26 DIAGNOSIS — D509 Iron deficiency anemia, unspecified: Secondary | ICD-10-CM | POA: Diagnosis not present

## 2019-12-26 DIAGNOSIS — N2581 Secondary hyperparathyroidism of renal origin: Secondary | ICD-10-CM | POA: Diagnosis not present

## 2019-12-29 DIAGNOSIS — D631 Anemia in chronic kidney disease: Secondary | ICD-10-CM | POA: Diagnosis not present

## 2019-12-29 DIAGNOSIS — R110 Nausea: Secondary | ICD-10-CM | POA: Diagnosis not present

## 2019-12-29 DIAGNOSIS — N186 End stage renal disease: Secondary | ICD-10-CM | POA: Diagnosis not present

## 2019-12-29 DIAGNOSIS — N2581 Secondary hyperparathyroidism of renal origin: Secondary | ICD-10-CM | POA: Diagnosis not present

## 2019-12-29 DIAGNOSIS — Z992 Dependence on renal dialysis: Secondary | ICD-10-CM | POA: Diagnosis not present

## 2019-12-29 DIAGNOSIS — D509 Iron deficiency anemia, unspecified: Secondary | ICD-10-CM | POA: Diagnosis not present

## 2019-12-31 DIAGNOSIS — D631 Anemia in chronic kidney disease: Secondary | ICD-10-CM | POA: Diagnosis not present

## 2019-12-31 DIAGNOSIS — R110 Nausea: Secondary | ICD-10-CM | POA: Diagnosis not present

## 2019-12-31 DIAGNOSIS — N186 End stage renal disease: Secondary | ICD-10-CM | POA: Diagnosis not present

## 2019-12-31 DIAGNOSIS — N2581 Secondary hyperparathyroidism of renal origin: Secondary | ICD-10-CM | POA: Diagnosis not present

## 2019-12-31 DIAGNOSIS — Z992 Dependence on renal dialysis: Secondary | ICD-10-CM | POA: Diagnosis not present

## 2019-12-31 DIAGNOSIS — D509 Iron deficiency anemia, unspecified: Secondary | ICD-10-CM | POA: Diagnosis not present

## 2020-01-02 DIAGNOSIS — N2581 Secondary hyperparathyroidism of renal origin: Secondary | ICD-10-CM | POA: Diagnosis not present

## 2020-01-02 DIAGNOSIS — Z992 Dependence on renal dialysis: Secondary | ICD-10-CM | POA: Diagnosis not present

## 2020-01-02 DIAGNOSIS — D631 Anemia in chronic kidney disease: Secondary | ICD-10-CM | POA: Diagnosis not present

## 2020-01-02 DIAGNOSIS — D509 Iron deficiency anemia, unspecified: Secondary | ICD-10-CM | POA: Diagnosis not present

## 2020-01-02 DIAGNOSIS — R110 Nausea: Secondary | ICD-10-CM | POA: Diagnosis not present

## 2020-01-02 DIAGNOSIS — N186 End stage renal disease: Secondary | ICD-10-CM | POA: Diagnosis not present

## 2020-01-03 DIAGNOSIS — N186 End stage renal disease: Secondary | ICD-10-CM | POA: Diagnosis not present

## 2020-01-03 DIAGNOSIS — Z992 Dependence on renal dialysis: Secondary | ICD-10-CM | POA: Diagnosis not present

## 2020-01-05 DIAGNOSIS — D631 Anemia in chronic kidney disease: Secondary | ICD-10-CM | POA: Diagnosis not present

## 2020-01-05 DIAGNOSIS — D509 Iron deficiency anemia, unspecified: Secondary | ICD-10-CM | POA: Diagnosis not present

## 2020-01-05 DIAGNOSIS — N2581 Secondary hyperparathyroidism of renal origin: Secondary | ICD-10-CM | POA: Diagnosis not present

## 2020-01-05 DIAGNOSIS — N186 End stage renal disease: Secondary | ICD-10-CM | POA: Diagnosis not present

## 2020-01-05 DIAGNOSIS — Z992 Dependence on renal dialysis: Secondary | ICD-10-CM | POA: Diagnosis not present

## 2020-01-09 DIAGNOSIS — Z992 Dependence on renal dialysis: Secondary | ICD-10-CM | POA: Diagnosis not present

## 2020-01-09 DIAGNOSIS — N2581 Secondary hyperparathyroidism of renal origin: Secondary | ICD-10-CM | POA: Diagnosis not present

## 2020-01-09 DIAGNOSIS — D509 Iron deficiency anemia, unspecified: Secondary | ICD-10-CM | POA: Diagnosis not present

## 2020-01-09 DIAGNOSIS — D631 Anemia in chronic kidney disease: Secondary | ICD-10-CM | POA: Diagnosis not present

## 2020-01-09 DIAGNOSIS — N186 End stage renal disease: Secondary | ICD-10-CM | POA: Diagnosis not present

## 2020-01-12 DIAGNOSIS — Z992 Dependence on renal dialysis: Secondary | ICD-10-CM | POA: Diagnosis not present

## 2020-01-12 DIAGNOSIS — D509 Iron deficiency anemia, unspecified: Secondary | ICD-10-CM | POA: Diagnosis not present

## 2020-01-12 DIAGNOSIS — N2581 Secondary hyperparathyroidism of renal origin: Secondary | ICD-10-CM | POA: Diagnosis not present

## 2020-01-12 DIAGNOSIS — N186 End stage renal disease: Secondary | ICD-10-CM | POA: Diagnosis not present

## 2020-01-12 DIAGNOSIS — D631 Anemia in chronic kidney disease: Secondary | ICD-10-CM | POA: Diagnosis not present

## 2020-01-14 DIAGNOSIS — Z992 Dependence on renal dialysis: Secondary | ICD-10-CM | POA: Diagnosis not present

## 2020-01-14 DIAGNOSIS — N186 End stage renal disease: Secondary | ICD-10-CM | POA: Diagnosis not present

## 2020-01-14 DIAGNOSIS — D631 Anemia in chronic kidney disease: Secondary | ICD-10-CM | POA: Diagnosis not present

## 2020-01-14 DIAGNOSIS — D509 Iron deficiency anemia, unspecified: Secondary | ICD-10-CM | POA: Diagnosis not present

## 2020-01-14 DIAGNOSIS — N2581 Secondary hyperparathyroidism of renal origin: Secondary | ICD-10-CM | POA: Diagnosis not present

## 2020-01-16 DIAGNOSIS — N2581 Secondary hyperparathyroidism of renal origin: Secondary | ICD-10-CM | POA: Diagnosis not present

## 2020-01-16 DIAGNOSIS — N186 End stage renal disease: Secondary | ICD-10-CM | POA: Diagnosis not present

## 2020-01-16 DIAGNOSIS — Z992 Dependence on renal dialysis: Secondary | ICD-10-CM | POA: Diagnosis not present

## 2020-01-16 DIAGNOSIS — D509 Iron deficiency anemia, unspecified: Secondary | ICD-10-CM | POA: Diagnosis not present

## 2020-01-16 DIAGNOSIS — D631 Anemia in chronic kidney disease: Secondary | ICD-10-CM | POA: Diagnosis not present

## 2020-01-19 DIAGNOSIS — N186 End stage renal disease: Secondary | ICD-10-CM | POA: Diagnosis not present

## 2020-01-19 DIAGNOSIS — Z992 Dependence on renal dialysis: Secondary | ICD-10-CM | POA: Diagnosis not present

## 2020-01-19 DIAGNOSIS — N2581 Secondary hyperparathyroidism of renal origin: Secondary | ICD-10-CM | POA: Diagnosis not present

## 2020-01-19 DIAGNOSIS — D631 Anemia in chronic kidney disease: Secondary | ICD-10-CM | POA: Diagnosis not present

## 2020-01-19 DIAGNOSIS — D509 Iron deficiency anemia, unspecified: Secondary | ICD-10-CM | POA: Diagnosis not present

## 2020-01-23 DIAGNOSIS — D509 Iron deficiency anemia, unspecified: Secondary | ICD-10-CM | POA: Diagnosis not present

## 2020-01-23 DIAGNOSIS — Z992 Dependence on renal dialysis: Secondary | ICD-10-CM | POA: Diagnosis not present

## 2020-01-23 DIAGNOSIS — N186 End stage renal disease: Secondary | ICD-10-CM | POA: Diagnosis not present

## 2020-01-23 DIAGNOSIS — N2581 Secondary hyperparathyroidism of renal origin: Secondary | ICD-10-CM | POA: Diagnosis not present

## 2020-01-23 DIAGNOSIS — D631 Anemia in chronic kidney disease: Secondary | ICD-10-CM | POA: Diagnosis not present

## 2020-01-26 DIAGNOSIS — Z992 Dependence on renal dialysis: Secondary | ICD-10-CM | POA: Diagnosis not present

## 2020-01-26 DIAGNOSIS — D631 Anemia in chronic kidney disease: Secondary | ICD-10-CM | POA: Diagnosis not present

## 2020-01-26 DIAGNOSIS — N2581 Secondary hyperparathyroidism of renal origin: Secondary | ICD-10-CM | POA: Diagnosis not present

## 2020-01-26 DIAGNOSIS — N186 End stage renal disease: Secondary | ICD-10-CM | POA: Diagnosis not present

## 2020-01-26 DIAGNOSIS — D509 Iron deficiency anemia, unspecified: Secondary | ICD-10-CM | POA: Diagnosis not present

## 2020-01-28 DIAGNOSIS — N186 End stage renal disease: Secondary | ICD-10-CM | POA: Diagnosis not present

## 2020-01-28 DIAGNOSIS — Z992 Dependence on renal dialysis: Secondary | ICD-10-CM | POA: Diagnosis not present

## 2020-01-28 DIAGNOSIS — D509 Iron deficiency anemia, unspecified: Secondary | ICD-10-CM | POA: Diagnosis not present

## 2020-01-28 DIAGNOSIS — D631 Anemia in chronic kidney disease: Secondary | ICD-10-CM | POA: Diagnosis not present

## 2020-01-28 DIAGNOSIS — N2581 Secondary hyperparathyroidism of renal origin: Secondary | ICD-10-CM | POA: Diagnosis not present

## 2020-01-30 DIAGNOSIS — D631 Anemia in chronic kidney disease: Secondary | ICD-10-CM | POA: Diagnosis not present

## 2020-01-30 DIAGNOSIS — D509 Iron deficiency anemia, unspecified: Secondary | ICD-10-CM | POA: Diagnosis not present

## 2020-01-30 DIAGNOSIS — N186 End stage renal disease: Secondary | ICD-10-CM | POA: Diagnosis not present

## 2020-01-30 DIAGNOSIS — Z992 Dependence on renal dialysis: Secondary | ICD-10-CM | POA: Diagnosis not present

## 2020-01-30 DIAGNOSIS — N2581 Secondary hyperparathyroidism of renal origin: Secondary | ICD-10-CM | POA: Diagnosis not present

## 2020-02-02 DIAGNOSIS — D631 Anemia in chronic kidney disease: Secondary | ICD-10-CM | POA: Diagnosis not present

## 2020-02-02 DIAGNOSIS — N2581 Secondary hyperparathyroidism of renal origin: Secondary | ICD-10-CM | POA: Diagnosis not present

## 2020-02-02 DIAGNOSIS — Z992 Dependence on renal dialysis: Secondary | ICD-10-CM | POA: Diagnosis not present

## 2020-02-02 DIAGNOSIS — D509 Iron deficiency anemia, unspecified: Secondary | ICD-10-CM | POA: Diagnosis not present

## 2020-02-02 DIAGNOSIS — N186 End stage renal disease: Secondary | ICD-10-CM | POA: Diagnosis not present

## 2020-02-03 DIAGNOSIS — Z992 Dependence on renal dialysis: Secondary | ICD-10-CM | POA: Diagnosis not present

## 2020-02-03 DIAGNOSIS — N186 End stage renal disease: Secondary | ICD-10-CM | POA: Diagnosis not present

## 2020-02-04 DIAGNOSIS — D509 Iron deficiency anemia, unspecified: Secondary | ICD-10-CM | POA: Diagnosis not present

## 2020-02-04 DIAGNOSIS — Z992 Dependence on renal dialysis: Secondary | ICD-10-CM | POA: Diagnosis not present

## 2020-02-04 DIAGNOSIS — N2581 Secondary hyperparathyroidism of renal origin: Secondary | ICD-10-CM | POA: Diagnosis not present

## 2020-02-04 DIAGNOSIS — N186 End stage renal disease: Secondary | ICD-10-CM | POA: Diagnosis not present

## 2020-02-04 DIAGNOSIS — D631 Anemia in chronic kidney disease: Secondary | ICD-10-CM | POA: Diagnosis not present

## 2020-02-09 DIAGNOSIS — D631 Anemia in chronic kidney disease: Secondary | ICD-10-CM | POA: Diagnosis not present

## 2020-02-09 DIAGNOSIS — D509 Iron deficiency anemia, unspecified: Secondary | ICD-10-CM | POA: Diagnosis not present

## 2020-02-09 DIAGNOSIS — Z992 Dependence on renal dialysis: Secondary | ICD-10-CM | POA: Diagnosis not present

## 2020-02-09 DIAGNOSIS — N2581 Secondary hyperparathyroidism of renal origin: Secondary | ICD-10-CM | POA: Diagnosis not present

## 2020-02-09 DIAGNOSIS — N186 End stage renal disease: Secondary | ICD-10-CM | POA: Diagnosis not present

## 2020-02-11 DIAGNOSIS — D631 Anemia in chronic kidney disease: Secondary | ICD-10-CM | POA: Diagnosis not present

## 2020-02-11 DIAGNOSIS — Z992 Dependence on renal dialysis: Secondary | ICD-10-CM | POA: Diagnosis not present

## 2020-02-11 DIAGNOSIS — D509 Iron deficiency anemia, unspecified: Secondary | ICD-10-CM | POA: Diagnosis not present

## 2020-02-11 DIAGNOSIS — N186 End stage renal disease: Secondary | ICD-10-CM | POA: Diagnosis not present

## 2020-02-11 DIAGNOSIS — N2581 Secondary hyperparathyroidism of renal origin: Secondary | ICD-10-CM | POA: Diagnosis not present

## 2020-02-12 DIAGNOSIS — C44319 Basal cell carcinoma of skin of other parts of face: Secondary | ICD-10-CM | POA: Diagnosis not present

## 2020-02-16 DIAGNOSIS — N186 End stage renal disease: Secondary | ICD-10-CM | POA: Diagnosis not present

## 2020-02-16 DIAGNOSIS — D631 Anemia in chronic kidney disease: Secondary | ICD-10-CM | POA: Diagnosis not present

## 2020-02-16 DIAGNOSIS — Z992 Dependence on renal dialysis: Secondary | ICD-10-CM | POA: Diagnosis not present

## 2020-02-16 DIAGNOSIS — D509 Iron deficiency anemia, unspecified: Secondary | ICD-10-CM | POA: Diagnosis not present

## 2020-02-16 DIAGNOSIS — N2581 Secondary hyperparathyroidism of renal origin: Secondary | ICD-10-CM | POA: Diagnosis not present

## 2020-02-18 DIAGNOSIS — D631 Anemia in chronic kidney disease: Secondary | ICD-10-CM | POA: Diagnosis not present

## 2020-02-18 DIAGNOSIS — N2581 Secondary hyperparathyroidism of renal origin: Secondary | ICD-10-CM | POA: Diagnosis not present

## 2020-02-18 DIAGNOSIS — D509 Iron deficiency anemia, unspecified: Secondary | ICD-10-CM | POA: Diagnosis not present

## 2020-02-18 DIAGNOSIS — N186 End stage renal disease: Secondary | ICD-10-CM | POA: Diagnosis not present

## 2020-02-18 DIAGNOSIS — Z992 Dependence on renal dialysis: Secondary | ICD-10-CM | POA: Diagnosis not present

## 2020-02-20 DIAGNOSIS — D509 Iron deficiency anemia, unspecified: Secondary | ICD-10-CM | POA: Diagnosis not present

## 2020-02-20 DIAGNOSIS — D631 Anemia in chronic kidney disease: Secondary | ICD-10-CM | POA: Diagnosis not present

## 2020-02-20 DIAGNOSIS — N186 End stage renal disease: Secondary | ICD-10-CM | POA: Diagnosis not present

## 2020-02-20 DIAGNOSIS — N2581 Secondary hyperparathyroidism of renal origin: Secondary | ICD-10-CM | POA: Diagnosis not present

## 2020-02-20 DIAGNOSIS — Z992 Dependence on renal dialysis: Secondary | ICD-10-CM | POA: Diagnosis not present

## 2020-02-23 DIAGNOSIS — N2581 Secondary hyperparathyroidism of renal origin: Secondary | ICD-10-CM | POA: Diagnosis not present

## 2020-02-23 DIAGNOSIS — D631 Anemia in chronic kidney disease: Secondary | ICD-10-CM | POA: Diagnosis not present

## 2020-02-23 DIAGNOSIS — Z992 Dependence on renal dialysis: Secondary | ICD-10-CM | POA: Diagnosis not present

## 2020-02-23 DIAGNOSIS — D509 Iron deficiency anemia, unspecified: Secondary | ICD-10-CM | POA: Diagnosis not present

## 2020-02-23 DIAGNOSIS — N186 End stage renal disease: Secondary | ICD-10-CM | POA: Diagnosis not present

## 2020-02-24 DIAGNOSIS — E1122 Type 2 diabetes mellitus with diabetic chronic kidney disease: Secondary | ICD-10-CM | POA: Diagnosis not present

## 2020-02-24 DIAGNOSIS — E119 Type 2 diabetes mellitus without complications: Secondary | ICD-10-CM | POA: Diagnosis not present

## 2020-02-24 DIAGNOSIS — Z299 Encounter for prophylactic measures, unspecified: Secondary | ICD-10-CM | POA: Diagnosis not present

## 2020-02-24 DIAGNOSIS — F0390 Unspecified dementia without behavioral disturbance: Secondary | ICD-10-CM | POA: Diagnosis not present

## 2020-02-24 DIAGNOSIS — N186 End stage renal disease: Secondary | ICD-10-CM | POA: Diagnosis not present

## 2020-02-24 DIAGNOSIS — I10 Essential (primary) hypertension: Secondary | ICD-10-CM | POA: Diagnosis not present

## 2020-02-24 DIAGNOSIS — M1710 Unilateral primary osteoarthritis, unspecified knee: Secondary | ICD-10-CM | POA: Diagnosis not present

## 2020-02-24 DIAGNOSIS — Z992 Dependence on renal dialysis: Secondary | ICD-10-CM | POA: Diagnosis not present

## 2020-02-25 DIAGNOSIS — N186 End stage renal disease: Secondary | ICD-10-CM | POA: Diagnosis not present

## 2020-02-25 DIAGNOSIS — D631 Anemia in chronic kidney disease: Secondary | ICD-10-CM | POA: Diagnosis not present

## 2020-02-25 DIAGNOSIS — N2581 Secondary hyperparathyroidism of renal origin: Secondary | ICD-10-CM | POA: Diagnosis not present

## 2020-02-25 DIAGNOSIS — D509 Iron deficiency anemia, unspecified: Secondary | ICD-10-CM | POA: Diagnosis not present

## 2020-02-25 DIAGNOSIS — Z992 Dependence on renal dialysis: Secondary | ICD-10-CM | POA: Diagnosis not present

## 2020-02-27 DIAGNOSIS — D509 Iron deficiency anemia, unspecified: Secondary | ICD-10-CM | POA: Diagnosis not present

## 2020-02-27 DIAGNOSIS — N2581 Secondary hyperparathyroidism of renal origin: Secondary | ICD-10-CM | POA: Diagnosis not present

## 2020-02-27 DIAGNOSIS — D631 Anemia in chronic kidney disease: Secondary | ICD-10-CM | POA: Diagnosis not present

## 2020-02-27 DIAGNOSIS — Z992 Dependence on renal dialysis: Secondary | ICD-10-CM | POA: Diagnosis not present

## 2020-02-27 DIAGNOSIS — N186 End stage renal disease: Secondary | ICD-10-CM | POA: Diagnosis not present

## 2020-03-01 DIAGNOSIS — N186 End stage renal disease: Secondary | ICD-10-CM | POA: Diagnosis not present

## 2020-03-01 DIAGNOSIS — Z794 Long term (current) use of insulin: Secondary | ICD-10-CM | POA: Diagnosis not present

## 2020-03-01 DIAGNOSIS — N2581 Secondary hyperparathyroidism of renal origin: Secondary | ICD-10-CM | POA: Diagnosis not present

## 2020-03-01 DIAGNOSIS — E119 Type 2 diabetes mellitus without complications: Secondary | ICD-10-CM | POA: Diagnosis not present

## 2020-03-01 DIAGNOSIS — D509 Iron deficiency anemia, unspecified: Secondary | ICD-10-CM | POA: Diagnosis not present

## 2020-03-01 DIAGNOSIS — Z992 Dependence on renal dialysis: Secondary | ICD-10-CM | POA: Diagnosis not present

## 2020-03-01 DIAGNOSIS — D631 Anemia in chronic kidney disease: Secondary | ICD-10-CM | POA: Diagnosis not present

## 2020-03-02 DIAGNOSIS — Z1339 Encounter for screening examination for other mental health and behavioral disorders: Secondary | ICD-10-CM | POA: Diagnosis not present

## 2020-03-02 DIAGNOSIS — R5383 Other fatigue: Secondary | ICD-10-CM | POA: Diagnosis not present

## 2020-03-02 DIAGNOSIS — Z7189 Other specified counseling: Secondary | ICD-10-CM | POA: Diagnosis not present

## 2020-03-02 DIAGNOSIS — I10 Essential (primary) hypertension: Secondary | ICD-10-CM | POA: Diagnosis not present

## 2020-03-02 DIAGNOSIS — Z713 Dietary counseling and surveillance: Secondary | ICD-10-CM | POA: Diagnosis not present

## 2020-03-02 DIAGNOSIS — Z299 Encounter for prophylactic measures, unspecified: Secondary | ICD-10-CM | POA: Diagnosis not present

## 2020-03-02 DIAGNOSIS — Z0000 Encounter for general adult medical examination without abnormal findings: Secondary | ICD-10-CM | POA: Diagnosis not present

## 2020-03-02 DIAGNOSIS — Z1331 Encounter for screening for depression: Secondary | ICD-10-CM | POA: Diagnosis not present

## 2020-03-02 DIAGNOSIS — E7800 Pure hypercholesterolemia, unspecified: Secondary | ICD-10-CM | POA: Diagnosis not present

## 2020-03-02 DIAGNOSIS — E1165 Type 2 diabetes mellitus with hyperglycemia: Secondary | ICD-10-CM | POA: Diagnosis not present

## 2020-03-03 DIAGNOSIS — Z992 Dependence on renal dialysis: Secondary | ICD-10-CM | POA: Diagnosis not present

## 2020-03-03 DIAGNOSIS — D509 Iron deficiency anemia, unspecified: Secondary | ICD-10-CM | POA: Diagnosis not present

## 2020-03-03 DIAGNOSIS — N186 End stage renal disease: Secondary | ICD-10-CM | POA: Diagnosis not present

## 2020-03-03 DIAGNOSIS — N2581 Secondary hyperparathyroidism of renal origin: Secondary | ICD-10-CM | POA: Diagnosis not present

## 2020-03-03 DIAGNOSIS — D631 Anemia in chronic kidney disease: Secondary | ICD-10-CM | POA: Diagnosis not present

## 2020-03-04 DIAGNOSIS — Z992 Dependence on renal dialysis: Secondary | ICD-10-CM | POA: Diagnosis not present

## 2020-03-04 DIAGNOSIS — N186 End stage renal disease: Secondary | ICD-10-CM | POA: Diagnosis not present

## 2020-03-05 DIAGNOSIS — N2581 Secondary hyperparathyroidism of renal origin: Secondary | ICD-10-CM | POA: Diagnosis not present

## 2020-03-05 DIAGNOSIS — Z23 Encounter for immunization: Secondary | ICD-10-CM | POA: Diagnosis not present

## 2020-03-05 DIAGNOSIS — D631 Anemia in chronic kidney disease: Secondary | ICD-10-CM | POA: Diagnosis not present

## 2020-03-05 DIAGNOSIS — Z992 Dependence on renal dialysis: Secondary | ICD-10-CM | POA: Diagnosis not present

## 2020-03-05 DIAGNOSIS — N186 End stage renal disease: Secondary | ICD-10-CM | POA: Diagnosis not present

## 2020-03-05 DIAGNOSIS — D509 Iron deficiency anemia, unspecified: Secondary | ICD-10-CM | POA: Diagnosis not present

## 2020-03-08 DIAGNOSIS — N2581 Secondary hyperparathyroidism of renal origin: Secondary | ICD-10-CM | POA: Diagnosis not present

## 2020-03-08 DIAGNOSIS — N186 End stage renal disease: Secondary | ICD-10-CM | POA: Diagnosis not present

## 2020-03-08 DIAGNOSIS — D631 Anemia in chronic kidney disease: Secondary | ICD-10-CM | POA: Diagnosis not present

## 2020-03-08 DIAGNOSIS — Z23 Encounter for immunization: Secondary | ICD-10-CM | POA: Diagnosis not present

## 2020-03-08 DIAGNOSIS — Z992 Dependence on renal dialysis: Secondary | ICD-10-CM | POA: Diagnosis not present

## 2020-03-08 DIAGNOSIS — D509 Iron deficiency anemia, unspecified: Secondary | ICD-10-CM | POA: Diagnosis not present

## 2020-03-10 DIAGNOSIS — N2581 Secondary hyperparathyroidism of renal origin: Secondary | ICD-10-CM | POA: Diagnosis not present

## 2020-03-10 DIAGNOSIS — Z992 Dependence on renal dialysis: Secondary | ICD-10-CM | POA: Diagnosis not present

## 2020-03-10 DIAGNOSIS — D631 Anemia in chronic kidney disease: Secondary | ICD-10-CM | POA: Diagnosis not present

## 2020-03-10 DIAGNOSIS — D509 Iron deficiency anemia, unspecified: Secondary | ICD-10-CM | POA: Diagnosis not present

## 2020-03-10 DIAGNOSIS — N186 End stage renal disease: Secondary | ICD-10-CM | POA: Diagnosis not present

## 2020-03-10 DIAGNOSIS — Z23 Encounter for immunization: Secondary | ICD-10-CM | POA: Diagnosis not present

## 2020-03-11 DIAGNOSIS — R413 Other amnesia: Secondary | ICD-10-CM | POA: Diagnosis not present

## 2020-03-12 DIAGNOSIS — D509 Iron deficiency anemia, unspecified: Secondary | ICD-10-CM | POA: Diagnosis not present

## 2020-03-12 DIAGNOSIS — D631 Anemia in chronic kidney disease: Secondary | ICD-10-CM | POA: Diagnosis not present

## 2020-03-12 DIAGNOSIS — N2581 Secondary hyperparathyroidism of renal origin: Secondary | ICD-10-CM | POA: Diagnosis not present

## 2020-03-12 DIAGNOSIS — Z992 Dependence on renal dialysis: Secondary | ICD-10-CM | POA: Diagnosis not present

## 2020-03-12 DIAGNOSIS — Z23 Encounter for immunization: Secondary | ICD-10-CM | POA: Diagnosis not present

## 2020-03-12 DIAGNOSIS — N186 End stage renal disease: Secondary | ICD-10-CM | POA: Diagnosis not present

## 2020-03-15 DIAGNOSIS — N186 End stage renal disease: Secondary | ICD-10-CM | POA: Diagnosis not present

## 2020-03-15 DIAGNOSIS — D509 Iron deficiency anemia, unspecified: Secondary | ICD-10-CM | POA: Diagnosis not present

## 2020-03-15 DIAGNOSIS — Z23 Encounter for immunization: Secondary | ICD-10-CM | POA: Diagnosis not present

## 2020-03-15 DIAGNOSIS — D631 Anemia in chronic kidney disease: Secondary | ICD-10-CM | POA: Diagnosis not present

## 2020-03-15 DIAGNOSIS — N2581 Secondary hyperparathyroidism of renal origin: Secondary | ICD-10-CM | POA: Diagnosis not present

## 2020-03-15 DIAGNOSIS — Z992 Dependence on renal dialysis: Secondary | ICD-10-CM | POA: Diagnosis not present

## 2020-03-19 DIAGNOSIS — Z23 Encounter for immunization: Secondary | ICD-10-CM | POA: Diagnosis not present

## 2020-03-19 DIAGNOSIS — N2581 Secondary hyperparathyroidism of renal origin: Secondary | ICD-10-CM | POA: Diagnosis not present

## 2020-03-19 DIAGNOSIS — D509 Iron deficiency anemia, unspecified: Secondary | ICD-10-CM | POA: Diagnosis not present

## 2020-03-19 DIAGNOSIS — N186 End stage renal disease: Secondary | ICD-10-CM | POA: Diagnosis not present

## 2020-03-19 DIAGNOSIS — D631 Anemia in chronic kidney disease: Secondary | ICD-10-CM | POA: Diagnosis not present

## 2020-03-19 DIAGNOSIS — Z992 Dependence on renal dialysis: Secondary | ICD-10-CM | POA: Diagnosis not present

## 2020-03-22 DIAGNOSIS — C44319 Basal cell carcinoma of skin of other parts of face: Secondary | ICD-10-CM | POA: Diagnosis not present

## 2020-03-24 DIAGNOSIS — N186 End stage renal disease: Secondary | ICD-10-CM | POA: Diagnosis not present

## 2020-03-24 DIAGNOSIS — D509 Iron deficiency anemia, unspecified: Secondary | ICD-10-CM | POA: Diagnosis not present

## 2020-03-24 DIAGNOSIS — D631 Anemia in chronic kidney disease: Secondary | ICD-10-CM | POA: Diagnosis not present

## 2020-03-24 DIAGNOSIS — Z23 Encounter for immunization: Secondary | ICD-10-CM | POA: Diagnosis not present

## 2020-03-24 DIAGNOSIS — N2581 Secondary hyperparathyroidism of renal origin: Secondary | ICD-10-CM | POA: Diagnosis not present

## 2020-03-24 DIAGNOSIS — Z992 Dependence on renal dialysis: Secondary | ICD-10-CM | POA: Diagnosis not present

## 2020-03-26 DIAGNOSIS — Z23 Encounter for immunization: Secondary | ICD-10-CM | POA: Diagnosis not present

## 2020-03-26 DIAGNOSIS — N186 End stage renal disease: Secondary | ICD-10-CM | POA: Diagnosis not present

## 2020-03-26 DIAGNOSIS — Z992 Dependence on renal dialysis: Secondary | ICD-10-CM | POA: Diagnosis not present

## 2020-03-26 DIAGNOSIS — D631 Anemia in chronic kidney disease: Secondary | ICD-10-CM | POA: Diagnosis not present

## 2020-03-26 DIAGNOSIS — N2581 Secondary hyperparathyroidism of renal origin: Secondary | ICD-10-CM | POA: Diagnosis not present

## 2020-03-26 DIAGNOSIS — D509 Iron deficiency anemia, unspecified: Secondary | ICD-10-CM | POA: Diagnosis not present

## 2020-03-29 DIAGNOSIS — I10 Essential (primary) hypertension: Secondary | ICD-10-CM | POA: Diagnosis not present

## 2020-03-29 DIAGNOSIS — F0390 Unspecified dementia without behavioral disturbance: Secondary | ICD-10-CM | POA: Diagnosis not present

## 2020-03-29 DIAGNOSIS — Z299 Encounter for prophylactic measures, unspecified: Secondary | ICD-10-CM | POA: Diagnosis not present

## 2020-03-29 DIAGNOSIS — N186 End stage renal disease: Secondary | ICD-10-CM | POA: Diagnosis not present

## 2020-03-29 DIAGNOSIS — Z992 Dependence on renal dialysis: Secondary | ICD-10-CM | POA: Diagnosis not present

## 2020-03-29 DIAGNOSIS — I120 Hypertensive chronic kidney disease with stage 5 chronic kidney disease or end stage renal disease: Secondary | ICD-10-CM | POA: Diagnosis not present

## 2020-03-30 DIAGNOSIS — Z4802 Encounter for removal of sutures: Secondary | ICD-10-CM | POA: Diagnosis not present

## 2020-03-31 DIAGNOSIS — D509 Iron deficiency anemia, unspecified: Secondary | ICD-10-CM | POA: Diagnosis not present

## 2020-03-31 DIAGNOSIS — N2581 Secondary hyperparathyroidism of renal origin: Secondary | ICD-10-CM | POA: Diagnosis not present

## 2020-03-31 DIAGNOSIS — Z992 Dependence on renal dialysis: Secondary | ICD-10-CM | POA: Diagnosis not present

## 2020-03-31 DIAGNOSIS — N186 End stage renal disease: Secondary | ICD-10-CM | POA: Diagnosis not present

## 2020-03-31 DIAGNOSIS — D631 Anemia in chronic kidney disease: Secondary | ICD-10-CM | POA: Diagnosis not present

## 2020-03-31 DIAGNOSIS — Z23 Encounter for immunization: Secondary | ICD-10-CM | POA: Diagnosis not present

## 2020-04-01 DIAGNOSIS — N2581 Secondary hyperparathyroidism of renal origin: Secondary | ICD-10-CM | POA: Diagnosis not present

## 2020-04-01 DIAGNOSIS — Z23 Encounter for immunization: Secondary | ICD-10-CM | POA: Diagnosis not present

## 2020-04-01 DIAGNOSIS — Z992 Dependence on renal dialysis: Secondary | ICD-10-CM | POA: Diagnosis not present

## 2020-04-01 DIAGNOSIS — D509 Iron deficiency anemia, unspecified: Secondary | ICD-10-CM | POA: Diagnosis not present

## 2020-04-01 DIAGNOSIS — N186 End stage renal disease: Secondary | ICD-10-CM | POA: Diagnosis not present

## 2020-04-01 DIAGNOSIS — D631 Anemia in chronic kidney disease: Secondary | ICD-10-CM | POA: Diagnosis not present

## 2020-04-02 DIAGNOSIS — N2581 Secondary hyperparathyroidism of renal origin: Secondary | ICD-10-CM | POA: Diagnosis not present

## 2020-04-02 DIAGNOSIS — N186 End stage renal disease: Secondary | ICD-10-CM | POA: Diagnosis not present

## 2020-04-02 DIAGNOSIS — Z992 Dependence on renal dialysis: Secondary | ICD-10-CM | POA: Diagnosis not present

## 2020-04-02 DIAGNOSIS — Z23 Encounter for immunization: Secondary | ICD-10-CM | POA: Diagnosis not present

## 2020-04-02 DIAGNOSIS — D631 Anemia in chronic kidney disease: Secondary | ICD-10-CM | POA: Diagnosis not present

## 2020-04-02 DIAGNOSIS — D509 Iron deficiency anemia, unspecified: Secondary | ICD-10-CM | POA: Diagnosis not present

## 2020-04-04 DIAGNOSIS — Z992 Dependence on renal dialysis: Secondary | ICD-10-CM | POA: Diagnosis not present

## 2020-04-04 DIAGNOSIS — N186 End stage renal disease: Secondary | ICD-10-CM | POA: Diagnosis not present

## 2020-04-05 DIAGNOSIS — N186 End stage renal disease: Secondary | ICD-10-CM | POA: Diagnosis not present

## 2020-04-05 DIAGNOSIS — D631 Anemia in chronic kidney disease: Secondary | ICD-10-CM | POA: Diagnosis not present

## 2020-04-05 DIAGNOSIS — Z992 Dependence on renal dialysis: Secondary | ICD-10-CM | POA: Diagnosis not present

## 2020-04-05 DIAGNOSIS — N2581 Secondary hyperparathyroidism of renal origin: Secondary | ICD-10-CM | POA: Diagnosis not present

## 2020-04-07 DIAGNOSIS — D631 Anemia in chronic kidney disease: Secondary | ICD-10-CM | POA: Diagnosis not present

## 2020-04-07 DIAGNOSIS — Z992 Dependence on renal dialysis: Secondary | ICD-10-CM | POA: Diagnosis not present

## 2020-04-07 DIAGNOSIS — N2581 Secondary hyperparathyroidism of renal origin: Secondary | ICD-10-CM | POA: Diagnosis not present

## 2020-04-07 DIAGNOSIS — N186 End stage renal disease: Secondary | ICD-10-CM | POA: Diagnosis not present

## 2020-04-09 DIAGNOSIS — D631 Anemia in chronic kidney disease: Secondary | ICD-10-CM | POA: Diagnosis not present

## 2020-04-09 DIAGNOSIS — Z992 Dependence on renal dialysis: Secondary | ICD-10-CM | POA: Diagnosis not present

## 2020-04-09 DIAGNOSIS — N186 End stage renal disease: Secondary | ICD-10-CM | POA: Diagnosis not present

## 2020-04-09 DIAGNOSIS — N2581 Secondary hyperparathyroidism of renal origin: Secondary | ICD-10-CM | POA: Diagnosis not present

## 2020-04-12 DIAGNOSIS — D631 Anemia in chronic kidney disease: Secondary | ICD-10-CM | POA: Diagnosis not present

## 2020-04-12 DIAGNOSIS — Z992 Dependence on renal dialysis: Secondary | ICD-10-CM | POA: Diagnosis not present

## 2020-04-12 DIAGNOSIS — N2581 Secondary hyperparathyroidism of renal origin: Secondary | ICD-10-CM | POA: Diagnosis not present

## 2020-04-12 DIAGNOSIS — N186 End stage renal disease: Secondary | ICD-10-CM | POA: Diagnosis not present

## 2020-04-16 DIAGNOSIS — N2581 Secondary hyperparathyroidism of renal origin: Secondary | ICD-10-CM | POA: Diagnosis not present

## 2020-04-16 DIAGNOSIS — N186 End stage renal disease: Secondary | ICD-10-CM | POA: Diagnosis not present

## 2020-04-16 DIAGNOSIS — D631 Anemia in chronic kidney disease: Secondary | ICD-10-CM | POA: Diagnosis not present

## 2020-04-16 DIAGNOSIS — Z992 Dependence on renal dialysis: Secondary | ICD-10-CM | POA: Diagnosis not present

## 2020-04-19 DIAGNOSIS — Z992 Dependence on renal dialysis: Secondary | ICD-10-CM | POA: Diagnosis not present

## 2020-04-19 DIAGNOSIS — D631 Anemia in chronic kidney disease: Secondary | ICD-10-CM | POA: Diagnosis not present

## 2020-04-19 DIAGNOSIS — N186 End stage renal disease: Secondary | ICD-10-CM | POA: Diagnosis not present

## 2020-04-19 DIAGNOSIS — N2581 Secondary hyperparathyroidism of renal origin: Secondary | ICD-10-CM | POA: Diagnosis not present

## 2020-05-04 DIAGNOSIS — Z992 Dependence on renal dialysis: Secondary | ICD-10-CM | POA: Diagnosis not present

## 2020-05-04 DIAGNOSIS — N186 End stage renal disease: Secondary | ICD-10-CM | POA: Diagnosis not present

## 2020-05-05 DEATH — deceased
